# Patient Record
Sex: Male | Born: 1960 | Race: Black or African American | Hispanic: No | Marital: Married | Smoking: Current every day smoker
Health system: Southern US, Community
[De-identification: ages and names within clinical notes are randomized; demographics above are authoritative.]

## PROBLEM LIST (undated history)

## (undated) DIAGNOSIS — I10 Essential (primary) hypertension: Secondary | ICD-10-CM

## (undated) DIAGNOSIS — E119 Type 2 diabetes mellitus without complications: Secondary | ICD-10-CM

## (undated) DIAGNOSIS — E785 Hyperlipidemia, unspecified: Secondary | ICD-10-CM

## (undated) DIAGNOSIS — G709 Myoneural disorder, unspecified: Secondary | ICD-10-CM

## (undated) DIAGNOSIS — K5792 Diverticulitis of intestine, part unspecified, without perforation or abscess without bleeding: Secondary | ICD-10-CM

## (undated) DIAGNOSIS — E079 Disorder of thyroid, unspecified: Secondary | ICD-10-CM

## (undated) DIAGNOSIS — G118 Other hereditary ataxias: Secondary | ICD-10-CM

## (undated) DIAGNOSIS — E291 Testicular hypofunction: Secondary | ICD-10-CM

## (undated) HISTORY — DX: Disorder of thyroid, unspecified: E07.9

## (undated) HISTORY — DX: Essential (primary) hypertension: I10

## (undated) HISTORY — DX: Myoneural disorder, unspecified: G70.9

## (undated) HISTORY — DX: Testicular hypofunction: E29.1

## (undated) HISTORY — DX: Type 2 diabetes mellitus without complications: E11.9

## (undated) HISTORY — DX: Hyperlipidemia, unspecified: E78.5

## (undated) HISTORY — DX: Other hereditary ataxias: G11.8

---

## 2001-06-27 ENCOUNTER — Emergency Department (HOSPITAL_COMMUNITY): Admission: EM | Admit: 2001-06-27 | Discharge: 2001-06-28 | Payer: Self-pay

## 2007-06-25 ENCOUNTER — Encounter: Admission: RE | Admit: 2007-06-25 | Discharge: 2007-06-25 | Payer: Self-pay | Admitting: Internal Medicine

## 2008-01-16 ENCOUNTER — Encounter: Admission: RE | Admit: 2008-01-16 | Discharge: 2008-01-16 | Payer: Self-pay | Admitting: Endocrinology

## 2008-12-25 ENCOUNTER — Ambulatory Visit (HOSPITAL_COMMUNITY): Admission: RE | Admit: 2008-12-25 | Discharge: 2008-12-25 | Payer: Self-pay | Admitting: Family Medicine

## 2009-01-14 ENCOUNTER — Ambulatory Visit (HOSPITAL_COMMUNITY): Admission: RE | Admit: 2009-01-14 | Discharge: 2009-01-14 | Payer: Self-pay | Admitting: Family Medicine

## 2009-01-14 ENCOUNTER — Encounter (INDEPENDENT_AMBULATORY_CARE_PROVIDER_SITE_OTHER): Payer: Self-pay | Admitting: Interventional Radiology

## 2009-02-06 DIAGNOSIS — E079 Disorder of thyroid, unspecified: Secondary | ICD-10-CM

## 2009-02-06 HISTORY — PX: BIOPSY THYROID: PRO38

## 2009-02-06 HISTORY — DX: Disorder of thyroid, unspecified: E07.9

## 2009-02-17 ENCOUNTER — Encounter (HOSPITAL_COMMUNITY): Admission: RE | Admit: 2009-02-17 | Discharge: 2009-05-12 | Payer: Self-pay | Admitting: Internal Medicine

## 2010-10-30 ENCOUNTER — Encounter: Payer: Self-pay | Admitting: Internal Medicine

## 2011-11-06 ENCOUNTER — Ambulatory Visit: Payer: BC Managed Care – PPO

## 2011-12-18 ENCOUNTER — Other Ambulatory Visit: Payer: Self-pay | Admitting: Family Medicine

## 2011-12-18 MED ORDER — LISINOPRIL-HYDROCHLOROTHIAZIDE 20-25 MG PO TABS: 1.0000 | ORAL_TABLET | Freq: Every day | ORAL | Status: DC

## 2011-12-23 ENCOUNTER — Ambulatory Visit (INDEPENDENT_AMBULATORY_CARE_PROVIDER_SITE_OTHER): Payer: Federal, State, Local not specified - PPO | Admitting: Family Medicine

## 2011-12-23 VITALS — BP 157/67 | HR 72 | Temp 98.4°F | Resp 16 | Ht 67.0 in | Wt 226.0 lb

## 2011-12-23 DIAGNOSIS — I10 Essential (primary) hypertension: Secondary | ICD-10-CM

## 2011-12-23 DIAGNOSIS — E1139 Type 2 diabetes mellitus with other diabetic ophthalmic complication: Secondary | ICD-10-CM | POA: Insufficient documentation

## 2011-12-23 DIAGNOSIS — E039 Hypothyroidism, unspecified: Secondary | ICD-10-CM | POA: Insufficient documentation

## 2011-12-23 DIAGNOSIS — E785 Hyperlipidemia, unspecified: Secondary | ICD-10-CM

## 2011-12-23 DIAGNOSIS — E119 Type 2 diabetes mellitus without complications: Secondary | ICD-10-CM

## 2011-12-23 DIAGNOSIS — Z720 Tobacco use: Secondary | ICD-10-CM

## 2011-12-23 LAB — LIPID PANEL
Cholesterol: 113 mg/dL (ref 0–200)
HDL: 34 mg/dL — ABNORMAL LOW (ref >39)
Total CHOL/HDL Ratio: 3.3 Ratio

## 2011-12-23 LAB — TSH: TSH: 2.628 u[IU]/mL (ref 0.350–4.500)

## 2011-12-23 MED ORDER — ATORVASTATIN CALCIUM 40 MG PO TABS: 40.0000 mg | ORAL_TABLET | Freq: Every day | ORAL | Status: DC

## 2011-12-23 MED ORDER — METOPROLOL SUCCINATE ER 100 MG PO TB24: 100.0000 mg | ORAL_TABLET | Freq: Every day | ORAL | Status: DC

## 2011-12-23 MED ORDER — ASPIRIN 81 MG PO TABS: 81.0000 mg | ORAL_TABLET | Freq: Every day | ORAL | Status: DC

## 2011-12-23 MED ORDER — SITAGLIPTIN PHOSPHATE 100 MG PO TABS: 100.0000 mg | ORAL_TABLET | Freq: Every day | ORAL | Status: DC

## 2011-12-23 MED ORDER — LEVOTHYROXINE SODIUM 112 MCG PO TABS: 112.0000 ug | ORAL_TABLET | Freq: Every day | ORAL | Status: DC

## 2011-12-23 MED ORDER — LISINOPRIL-HYDROCHLOROTHIAZIDE 20-25 MG PO TABS: 1.0000 | ORAL_TABLET | Freq: Every day | ORAL | Status: DC

## 2011-12-23 MED ORDER — GLIMEPIRIDE 4 MG PO TABS: 4.0000 mg | ORAL_TABLET | Freq: Every day | ORAL | Status: DC

## 2011-12-23 NOTE — Progress Notes (Signed)
  Subjective:    Patient ID: Randy Ballard, male    DOB: 1961-04-07, 51 y.o.   MRN: [id number]  HPI  Patient presents for routine follow up of his diabetes.  He was diagnosed in 1996.  Recently prescribed Januvia however was too expensive. Subsequently wife put patient on her insurance plan and now patient taking medication daily for 2-3 weeks.  No problems with vision or feet. Last eye exam 3 years ago.  Exercising on treadmill for 30 minutes nightly Reducing carbs and simple sugars   Stressors at work Smoker; 1 pack/day; has tried [employer] cigarettes. Review of Systems     Objective:   Physical Exam  Constitutional: He appears well-developed and well-nourished.  Neck: Carotid bruit is not present. No thyromegaly present.  Cardiovascular: Normal rate, regular rhythm and normal heart sounds.   No murmur heard. Pulmonary/Chest: Effort normal and breath sounds normal.  Abdominal: Soft. Bowel sounds are normal. There is no hepatosplenomegaly.       No abdominal bruits  Neurological: He is alert.  Skin: Skin is warm. Pallor: no neuropathic changes.  Psychiatric: He has a normal mood and affect.     Results for orders placed in visit on 12/23/11  POCT GLYCOSYLATED HEMOGLOBIN (HGB A1C)      Component Value Range   Hemoglobin A1C 6.1         Assessment & Plan:   1. HTN (hypertension)    2. DM type 2 (diabetes mellitus, type 2)  POCT glycosylated hemoglobin (Hb A1C), Microalbumin, urine  3. Hypothyroid  TSH  4. Dyslipidemia  Lipid panel  5. Tobacco abuse     Refilled all medications Anticipatory guidance regarding tobacco cessation Follow up in 3 months

## 2011-12-26 ENCOUNTER — Telehealth: Payer: Self-pay

## 2011-12-26 MED ORDER — METFORMIN HCL 1000 MG PO TABS: 1000.0000 mg | ORAL_TABLET | Freq: Two times a day (BID) | ORAL | Status: DC

## 2011-12-26 NOTE — Telephone Encounter (Signed)
Looks like it was intended to be refilled with the others, but got missed.  I've sent in the metformin.  Please advise the patient.

## 2011-12-26 NOTE — Telephone Encounter (Signed)
PT STATES HE WAS SEEN AND GIVEN NEW RXS BUT THEY DID NOT REFILL METFORMIN,IS HE STILL TO TAKE THAT AND IF SO WHY WAS THAT RX NOT INCLUDED WITH OTHERS??    BEST PHONE  [phone]

## 2011-12-27 NOTE — Telephone Encounter (Signed)
Notified pt that Metformin was sent into his pharmacy. Pt thanked [place].

## 2012-02-05 ENCOUNTER — Ambulatory Visit (INDEPENDENT_AMBULATORY_CARE_PROVIDER_SITE_OTHER): Payer: Federal, State, Local not specified - PPO | Admitting: Internal Medicine

## 2012-02-05 VITALS — BP 170/74 | HR 73 | Temp 98.4°F | Resp 18 | Ht 67.0 in | Wt 223.0 lb

## 2012-02-05 DIAGNOSIS — Z0000 Encounter for general adult medical examination without abnormal findings: Secondary | ICD-10-CM

## 2012-02-05 DIAGNOSIS — Z6834 Body mass index (BMI) 34.0-34.9, adult: Secondary | ICD-10-CM | POA: Insufficient documentation

## 2012-02-05 NOTE — Progress Notes (Signed)
  Subjective:    Patient ID: Randy Ballard, male    DOB: [birth year], 51 y.o.   MRN: [id number]  HPIHere for annual physical Patient Active Problem List  Diagnoses  . HTN (hypertension)  . DM type 2 (diabetes mellitus, type 2)  . Hypothyroid  . Dyslipidemia  . BMI 34.0-34.9,adult  Was here one month ago for labs and medication refills per Dr. 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        Has lost 6 pounds in the last 3 months and is trying hard to lose more  HENT: Positive for dental problem. Negative for hearing loss, ear pain, congestion, mouth sores and trouble swallowing.   Eyes: Negative for photophobia and visual disturbance.  Respiratory: Negative for apnea, cough, choking and chest tightness.   Cardiovascular: Negative for chest pain, palpitations and leg swelling.  Gastrointestinal: Negative for abdominal pain, diarrhea, constipation, blood in stool and rectal pain.  Genitourinary: Negative for urgency and difficulty urinating.  Musculoskeletal: Negative for back pain and joint swelling.  Skin: Negative.   Neurological: Negative.   Hematological: Negative.   Psychiatric/Behavioral: Negative.        Objective:   Physical Exam  Constitutional: He is oriented to person, place, and time. He appears well-developed.       BMI 34  HENT:  Head: Normocephalic.  Right Ear: External ear normal.  Left Ear: External ear normal.  Nose: Nose normal.       He has poor dentition with one molar destroyed to the gumline on the right lower area/several teeth are decayed and several are missing  Eyes: Conjunctivae and EOM are normal. Pupils are equal, round, and reactive to light.  Neck: Normal range of motion. Neck supple. No thyromegaly present.  Cardiovascular: Normal rate, regular rhythm, normal heart sounds and intact distal  pulses.   No murmur heard.      No carotid bruit  Pulmonary/Chest: Effort normal and breath sounds normal.  Abdominal: Soft. Bowel sounds are normal. He exhibits no mass.       No abdominal bruit  Genitourinary: Prostate normal.  Musculoskeletal: Normal range of motion. He exhibits no edema.  Lymphadenopathy:    He has no cervical adenopathy.  Neurological: He is alert and oriented to person, place, and time. He has normal reflexes. No cranial nerve deficit.  Psychiatric: He has a normal mood and affect. Judgment normal.      Results for orders placed in visit on 02/05/12  IFOBT (OCCULT BLOOD)      Component Value Range   IFOBT Negative         Assessment & Plan:  Annual physical examination Patient Active Problem List  Diagnoses  . HTN (hypertension)  . DM type 2 (diabetes mellitus, type 2)  . Hypothyroid  . Dyslipidemia  . BMI 34.0-34.9,adult  --  Nicotine addiction No labs needed today 2 follow up in June for labs and medication refills Needs PSA at next lab work He plans to schedule colonoscopy this year Weight loss advised Smoking cessation advised

## 2012-02-27 ENCOUNTER — Other Ambulatory Visit: Payer: Self-pay | Admitting: Family Medicine

## 2012-03-30 ENCOUNTER — Ambulatory Visit (INDEPENDENT_AMBULATORY_CARE_PROVIDER_SITE_OTHER): Payer: Federal, State, Local not specified - PPO | Admitting: Emergency Medicine

## 2012-03-30 VITALS — BP 139/78 | HR 72 | Temp 98.4°F | Resp 16 | Ht 67.0 in | Wt 228.2 lb

## 2012-03-30 DIAGNOSIS — I10 Essential (primary) hypertension: Secondary | ICD-10-CM

## 2012-03-30 DIAGNOSIS — E039 Hypothyroidism, unspecified: Secondary | ICD-10-CM

## 2012-03-30 DIAGNOSIS — E785 Hyperlipidemia, unspecified: Secondary | ICD-10-CM

## 2012-03-30 DIAGNOSIS — E119 Type 2 diabetes mellitus without complications: Secondary | ICD-10-CM

## 2012-03-30 LAB — LIPID PANEL
HDL: 31 mg/dL — ABNORMAL LOW (ref >39)
LDL Cholesterol: 65 mg/dL (ref 0–99)
Triglycerides: 71 mg/dL (ref <150)
VLDL: 14 mg/dL (ref 0–40)

## 2012-03-30 LAB — COMPREHENSIVE METABOLIC PANEL
ALT: 60 U/L — ABNORMAL HIGH (ref 0–53)
AST: 41 U/L — ABNORMAL HIGH (ref 0–37)
Creat: 0.89 mg/dL (ref 0.50–1.35)
Total Bilirubin: 0.4 mg/dL (ref 0.3–1.2)

## 2012-03-30 LAB — TESTOSTERONE: Testosterone: 200.83 ng/dL — ABNORMAL LOW (ref 300–890)

## 2012-03-30 MED ORDER — SITAGLIPTIN PHOSPHATE 100 MG PO TABS: 100.0000 mg | ORAL_TABLET | Freq: Every day | ORAL | Status: DC

## 2012-03-30 MED ORDER — METFORMIN HCL 1000 MG PO TABS: 1000.0000 mg | ORAL_TABLET | Freq: Two times a day (BID) | ORAL | Status: DC

## 2012-03-30 MED ORDER — ATORVASTATIN CALCIUM 40 MG PO TABS: 40.0000 mg | ORAL_TABLET | Freq: Every day | ORAL | Status: DC

## 2012-03-30 MED ORDER — LISINOPRIL-HYDROCHLOROTHIAZIDE 20-25 MG PO TABS: 1.0000 | ORAL_TABLET | Freq: Every day | ORAL | Status: DC

## 2012-03-30 MED ORDER — LEVOTHYROXINE SODIUM 112 MCG PO TABS: 112.0000 ug | ORAL_TABLET | Freq: Every day | ORAL | Status: DC

## 2012-03-30 MED ORDER — GLIMEPIRIDE 4 MG PO TABS: 4.0000 mg | ORAL_TABLET | Freq: Every day | ORAL | Status: DC

## 2012-03-30 MED ORDER — METOPROLOL SUCCINATE ER 100 MG PO TB24: 100.0000 mg | ORAL_TABLET | Freq: Every day | ORAL | Status: DC

## 2012-03-30 NOTE — Progress Notes (Signed)
  Subjective:    Patient ID: Randy Ballard, male    DOB: 08/01/1961, 51 y.o.   MRN: [id number]  HPI  DOT medical history and form completion.   Interval history of Diabetes stable   Review of Systems  Constitutional: Negative.   HENT: Negative.   Eyes: Negative.   Respiratory: Negative.   Cardiovascular: Negative.   Gastrointestinal: Negative.   Musculoskeletal: Negative.   Neurological: Negative.        Objective:   Physical Exam  Constitutional: He is oriented to person, place, and time. He appears well-developed and well-nourished.  HENT:  Head: Atraumatic.  Right Ear: External ear normal.  Left Ear: External ear normal.  Eyes: Conjunctivae are normal. Pupils are equal, round, and reactive to light.  Neck: Normal range of motion. Neck supple.  Cardiovascular: Normal rate and regular rhythm.   Pulmonary/Chest: Effort normal and breath sounds normal.  Abdominal: Soft.  Musculoskeletal: Normal range of motion.  Neurological: He is alert and oriented to person, place, and time.  Skin: Skin is warm and dry.          Assessment & Plan:  Labs drawn, form completed

## 2012-03-30 NOTE — Addendum Note (Signed)
Addended by: Carmelina Dane on: 03/30/2012 10:17 AM   Modules accepted: Orders, Level of Service

## 2012-03-31 ENCOUNTER — Encounter: Payer: Self-pay | Admitting: Emergency Medicine

## 2012-03-31 ENCOUNTER — Other Ambulatory Visit: Payer: Self-pay | Admitting: Emergency Medicine

## 2012-03-31 MED ORDER — TESTOSTERONE 20.25 MG/1.25GM (1.62%) TD GEL: 2.0000 | TRANSDERMAL | Status: DC

## 2012-04-05 ENCOUNTER — Other Ambulatory Visit: Payer: Self-pay | Admitting: Emergency Medicine

## 2012-04-05 ENCOUNTER — Encounter: Payer: Self-pay | Admitting: Emergency Medicine

## 2012-04-05 DIAGNOSIS — E119 Type 2 diabetes mellitus without complications: Secondary | ICD-10-CM

## 2012-04-05 MED ORDER — VITAMIN D (ERGOCALCIFEROL) 1.25 MG (50000 UNIT) PO CAPS: 50000.0000 [IU] | ORAL_CAPSULE | Freq: Once | ORAL | Status: DC

## 2012-04-05 MED ORDER — ERGOCALCIFEROL 1.25 MG (50000 UT) PO CAPS: 50000.0000 [IU] | ORAL_CAPSULE | ORAL | Status: DC

## 2012-04-11 ENCOUNTER — Encounter: Payer: Self-pay | Admitting: Emergency Medicine

## 2012-08-05 ENCOUNTER — Ambulatory Visit (INDEPENDENT_AMBULATORY_CARE_PROVIDER_SITE_OTHER): Payer: Federal, State, Local not specified - PPO | Admitting: Emergency Medicine

## 2012-08-05 VITALS — BP 168/72 | HR 79 | Temp 98.6°F | Resp 18 | Wt 234.0 lb

## 2012-08-05 DIAGNOSIS — L602 Onychogryphosis: Secondary | ICD-10-CM

## 2012-08-05 DIAGNOSIS — L608 Other nail disorders: Secondary | ICD-10-CM

## 2012-08-05 DIAGNOSIS — E119 Type 2 diabetes mellitus without complications: Secondary | ICD-10-CM

## 2012-08-05 NOTE — Progress Notes (Signed)
[facility] [address] [place] [address] [phone]  Date:  08/05/2012   Name:  Randy Ballard   DOB:  [birth year]   MRN:  [id number]  PCP:  No primary provider on file.    Chief Complaint: Toe Pain   History of Present Illness:  Randy Ballard is a 51 y.o. very pleasant male patient who presents with the following:  Thick and brittle nails.  Has broken his left great toe nail.  Hanging by a thread he says.  Denies bleeding or infection  Patient Active Problem List  Diagnosis  . HTN (hypertension)  . DM type 2 (diabetes mellitus, type 2)  . Hypothyroid  . Dyslipidemia  . BMI 34.0-34.9,adult    Past Medical History  Diagnosis Date  . Diabetes mellitus without complication     No past surgical history on file.  History  Substance Use Topics  . Smoking status: Current Every Day Smoker -- 1.0 packs/day for 30 years  . Smokeless tobacco: Not on file  . Alcohol Use: No    No family history on file.  No Known Allergies  Medication list has been reviewed and updated.  Current Outpatient Prescriptions on File Prior to Visit  Medication Sig Dispense Refill  . aspirin 81 MG tablet Take 1 tablet (81 mg total) by mouth daily.  30 tablet  11  . atorvastatin (LIPITOR) 40 MG tablet Take 1 tablet (40 mg total) by mouth daily.  30 tablet  5  . glimepiride (AMARYL) 4 MG tablet Take 1 tablet (4 mg total) by mouth daily before breakfast.  30 tablet  5  . levothyroxine (SYNTHROID, LEVOTHROID) 112 MCG tablet Take 1 tablet (112 mcg total) by mouth daily.  90 tablet  2  . lisinopril-hydrochlorothiazide (PRINZIDE,ZESTORETIC) 20-25 MG per tablet Take 1 tablet by mouth daily.  30 tablet  5  . metFORMIN (GLUCOPHAGE) 1000 MG tablet Take 1 tablet (1,000 mg total) by mouth 2 (two) times daily with a meal.  60 tablet  5  . metoprolol succinate (TOPROL-XL) 100 MG 24 hr tablet Take 1 tablet (100 mg total) by mouth daily. Take with or immediately following a meal.   30 tablet  5  . Multiple Vitamins-Minerals (MULTIVITAMIN WITH MINERALS) tablet Take 1 tablet by mouth daily.      . sitaGLIPtin (JANUVIA) 100 MG tablet Take 1 tablet (100 mg total) by mouth daily.  30 tablet  5  . ergocalciferol (VITAMIN D2) 50000 UNITS capsule Take 1 capsule (50,000 Units total) by mouth once a week.  4 capsule  12  . Testosterone (ANDROGEL) 20.25 MG/1.25GM (1.62%) GEL Place 2 Squirts onto the skin 1 day or 1 dose.  2.5 g  5    Review of Systems:  As per HPI, otherwise negative.    Physical Examination: Filed Vitals:   08/05/12 1603  BP: 168/72  Pulse: 79  Temp: 98.6 F (37 C)  Resp: 18   Filed Vitals:   08/05/12 1603  Weight: 234 lb (106.142 kg)   There is no height on file to calculate BMI. Ideal Body Weight:     GEN: WDWN, NAD, Non-toxic, Alert & Oriented x 3 HEENT: Atraumatic, Normocephalic.  Ears and Nose: No external deformity. EXTR: No clubbing/cyanosis/edema NEURO: Normal gait.  PSYCH: Normally interactive. Conversant. Not depressed or anxious appearing.  Calm demeanor.  Feet:  onychogyphosis   Assessment and Plan: Nail trimmed to remove avulsed portion of nail plate  Carmelina Dane,  MD   

## 2012-08-12 NOTE — Progress Notes (Signed)
Reviewed and agree.

## 2012-10-27 ENCOUNTER — Ambulatory Visit (INDEPENDENT_AMBULATORY_CARE_PROVIDER_SITE_OTHER): Payer: Federal, State, Local not specified - PPO | Admitting: Family Medicine

## 2012-10-27 VITALS — BP 181/75 | HR 86 | Temp 98.6°F | Resp 16 | Ht 68.0 in | Wt 230.0 lb

## 2012-10-27 DIAGNOSIS — I10 Essential (primary) hypertension: Secondary | ICD-10-CM

## 2012-10-27 DIAGNOSIS — E119 Type 2 diabetes mellitus without complications: Secondary | ICD-10-CM

## 2012-10-27 DIAGNOSIS — E7800 Pure hypercholesterolemia, unspecified: Secondary | ICD-10-CM

## 2012-10-27 DIAGNOSIS — E785 Hyperlipidemia, unspecified: Secondary | ICD-10-CM

## 2012-10-27 DIAGNOSIS — Z23 Encounter for immunization: Secondary | ICD-10-CM

## 2012-10-27 DIAGNOSIS — D649 Anemia, unspecified: Secondary | ICD-10-CM

## 2012-10-27 DIAGNOSIS — E039 Hypothyroidism, unspecified: Secondary | ICD-10-CM

## 2012-10-27 LAB — LIPID PANEL
HDL: 30 mg/dL — ABNORMAL LOW (ref >39)
LDL Cholesterol: 111 mg/dL — ABNORMAL HIGH (ref 0–99)
Triglycerides: 252 mg/dL — ABNORMAL HIGH (ref <150)
VLDL: 50 mg/dL — ABNORMAL HIGH (ref 0–40)

## 2012-10-27 LAB — POCT URINALYSIS DIPSTICK
Blood, UA: NEGATIVE
Nitrite, UA: NEGATIVE
Protein, UA: 30
Urobilinogen, UA: 0.2
pH, UA: 5

## 2012-10-27 LAB — POCT GLYCOSYLATED HEMOGLOBIN (HGB A1C): Hemoglobin A1C: 8.7

## 2012-10-27 LAB — POCT CBC
Hemoglobin: 10.2 g/dL — AB (ref 14.1–18.1)
Lymph, poc: 1.8 (ref 0.6–3.4)
MCHC: 31.5 g/dL — AB (ref 31.8–35.4)
MPV: 9.2 fL (ref 0–99.8)
POC Granulocyte: 5.1 (ref 2–6.9)
POC LYMPH PERCENT: 24.4 %L (ref 10–50)
POC MID %: 6.1 %M (ref 0–12)
RDW, POC: 12.8 %

## 2012-10-27 LAB — COMPREHENSIVE METABOLIC PANEL
AST: 29 U/L (ref 0–37)
Alkaline Phosphatase: 74 U/L (ref 39–117)
BUN: 11 mg/dL (ref 6–23)
Calcium: 9.5 mg/dL (ref 8.4–10.5)
Creat: 0.93 mg/dL (ref 0.50–1.35)

## 2012-10-27 LAB — IBC PANEL
%SAT: 32 % (ref 20–55)
TIBC: 319 ug/dL (ref 215–435)

## 2012-10-27 LAB — IFOBT (OCCULT BLOOD): IFOBT: NEGATIVE

## 2012-10-27 MED ORDER — METOPROLOL SUCCINATE ER 100 MG PO TB24: 100.0000 mg | ORAL_TABLET | Freq: Every day | ORAL | Status: DC

## 2012-10-27 MED ORDER — SITAGLIPTIN PHOSPHATE 100 MG PO TABS: 100.0000 mg | ORAL_TABLET | Freq: Every day | ORAL | Status: DC

## 2012-10-27 MED ORDER — GLIMEPIRIDE 4 MG PO TABS: 4.0000 mg | ORAL_TABLET | Freq: Every day | ORAL | Status: DC

## 2012-10-27 MED ORDER — LISINOPRIL-HYDROCHLOROTHIAZIDE 20-25 MG PO TABS: 1.0000 | ORAL_TABLET | Freq: Every day | ORAL | Status: DC

## 2012-10-27 MED ORDER — ATORVASTATIN CALCIUM 40 MG PO TABS: 40.0000 mg | ORAL_TABLET | Freq: Every day | ORAL | Status: DC

## 2012-10-27 MED ORDER — LEVOTHYROXINE SODIUM 112 MCG PO TABS: 112.0000 ug | ORAL_TABLET | Freq: Every day | ORAL | Status: DC

## 2012-10-27 MED ORDER — METFORMIN HCL 1000 MG PO TABS: 1000.0000 mg | ORAL_TABLET | Freq: Two times a day (BID) | ORAL | Status: DC

## 2012-10-27 NOTE — Progress Notes (Signed)
[address]   [place], [place]  [address]   [phone]  Subjective:    Patient ID: Randy Ballard, male    DOB: Jul 15, 1961, 52 y.o.   MRN: [id number]  HPIThis 52 y.o. male presents for evaluation of the following:  1.  DMII:  Seven month follow-up.  Checking sugars weekly; 150 fasting; highest reading 212 random.  Examines feet closely.  Ran out of medication other than Metformin.  Using OTC liquid fungal toenail medication twice daily.  S/p flu vaccine.  No Pneumovax in past.   Last eye exam 3 years.  No changes to management at last visit; last HgbA1c 6.2.  2.  Hyperlipidemia: seven month follow-up.  No changes to management at last visit.  Ran out of medication in past several weeks.  Denies HA/dizziness/focal weakness/paresthesias.   3.  HTN:  Ran out of medication; 130/72 usually when taking all medication.  No changes to management made at last visit.  Denies CP/palp/SOB but some leg swelling LLE ankle.    4.  Hypothyroidism:  Ten month follow-up; no changes to management made at last visit; feels well; does have weakness of hands at times; mother with some neuromuscular disease that is genetic; has paperwork from mother's physician but forgot to bring it today; will bring to next visit.  5.  Anemia: no previous history of anemia; denies n/v/d/c.  Denies bloody stools or melena.  Denies abdominal pain.  No unintentional weight loss.  No previous colonoscopy but planned to complete in upcoming year.  Review of Systems  Constitutional: Negative for fever, chills, diaphoresis and fatigue.  Respiratory: Negative for apnea, shortness of breath and wheezing.   Cardiovascular: Positive for leg swelling. Negative for chest pain and palpitations.  Gastrointestinal: Negative for nausea, vomiting, abdominal pain and diarrhea.  Skin: Negative for color change, pallor, rash and wound.  Neurological: Negative for dizziness, tremors, syncope, facial asymmetry, speech difficulty, weakness,  light-headedness, numbness and headaches.        Past Medical History  Diagnosis Date  . Diabetes mellitus without complication   . Hyperlipidemia   . Hypertension   . Thyroid disease   . Hypogonadism male     History reviewed. No pertinent past surgical history.  Prior to Admission medications   Medication Sig Start Date End Date Taking? Authorizing Provider  aspirin 81 MG tablet Take 1 tablet (81 mg total) by mouth daily. 12/23/11  Yes Dois Davenport, MD  atorvastatin (LIPITOR) 40 MG tablet Take 1 tablet (40 mg total) by mouth daily. 03/30/12  Yes Phillips Odor, MD  levothyroxine (SYNTHROID, LEVOTHROID) 112 MCG tablet Take 1 tablet (112 mcg total) by mouth daily. 03/30/12  Yes Phillips Odor, MD  lisinopril-hydrochlorothiazide (PRINZIDE,ZESTORETIC) 20-25 MG per tablet Take 1 tablet by mouth daily. 03/30/12  Yes Phillips Odor, MD  metFORMIN (GLUCOPHAGE) 1000 MG tablet Take 1 tablet (1,000 mg total) by mouth 2 (two) times daily with a meal. 03/30/12  Yes Phillips Odor, MD  metoprolol succinate (TOPROL-XL) 100 MG 24 hr tablet Take 1 tablet (100 mg total) by mouth daily. Take with or immediately following a meal. 03/30/12  Yes Phillips Odor, MD  Multiple Vitamins-Minerals (MULTIVITAMIN WITH MINERALS) tablet Take 1 tablet by mouth daily.   Yes Historical Provider, MD  sitaGLIPtin (JANUVIA) 100 MG tablet Take 1 tablet (100 mg total) by mouth daily. 03/30/12  Yes Phillips Odor, MD  glimepiride (AMARYL) 4 MG tablet Take 1 tablet (4 mg total) by mouth daily before breakfast. 03/30/12  Phillips Odor, MD    No Known Allergies  History   Social History  . Marital Status: Married    Spouse Name: N/A    Number of Children: N/A  . Years of Education: N/A   Occupational History  . service rep    Social History Main Topics  . Smoking status: Current Some Day Smoker -- 1.0 packs/day for 30 years  . Smokeless tobacco: Not on file  . Alcohol Use: No  . Drug Use: No  .  Sexually Active: Yes     Comment: 1 partner in last 12 months   Other Topics Concern  . Not on file   Social History Narrative   Marital status:  Married x 20 years; happily.   Children: none   Lives: with wife.   Employment: delivers oxygen; works for [employer]; drives locally   Tobacco: trying to quit 10/2012.   Alcohol:  Once per month   Drugs: none   Exercise: treadmill sporadic.    History reviewed. No pertinent family history.  Objective:   Physical Exam  Nursing note and vitals reviewed. Constitutional: He is oriented to person, place, and time. He appears well-developed and well-nourished. No distress.  HENT:  Mouth/Throat: Oropharynx is clear and moist.  Eyes: Conjunctivae normal and EOM are normal. Pupils are equal, round, and reactive to light.  Neck: Normal range of motion. Neck supple. No JVD present. No thyromegaly present.  Cardiovascular: Normal rate, regular rhythm, normal heart sounds and intact distal pulses.  Exam reveals no gallop and no friction rub.   No murmur heard. Pulmonary/Chest: Effort normal and breath sounds normal. He has no wheezes. He has no rales.  Abdominal: Soft. Bowel sounds are normal. He exhibits no distension and no mass. There is no tenderness. There is no rebound and no guarding.  Genitourinary: Rectal exam shows no external hemorrhoid, no fissure, no mass and no tenderness.  Lymphadenopathy:    He has no cervical adenopathy.  Neurological: He is alert and oriented to person, place, and time. No cranial nerve deficit. He exhibits normal muscle tone. Coordination normal.       Monofilament intact B feet.    Skin: Skin is warm and dry. No rash noted. He is not diaphoretic.       Dystrophic nails throughout B feet.  Psychiatric: He has a normal mood and affect. His behavior is normal.    PNEUMOVAX ADMINISTERED.  Results for orders placed in visit on 10/27/12  POCT CBC      Component Value Range   WBC 7.3  4.6 - 10.2 K/uL   Lymph, poc 1.8   0.6 - 3.4   POC LYMPH PERCENT 24.4  10 - 50 %L   MID (cbc) 0.4  0 - 0.9   POC MID % 6.1  0 - 12 %M   POC Granulocyte 5.1  2 - 6.9   Granulocyte percent 69.5  37 - 80 %G   RBC 3.40 (*) 4.69 - 6.13 M/uL   Hemoglobin 10.2 (*) 14.1 - 18.1 g/dL   HCT, POC 21.3 (*) 08.6 - 53.7 %   MCV 95.2  80 - 97 fL   MCH, POC 30.0  27 - 31.2 pg   MCHC 31.5 (*) 31.8 - 35.4 g/dL   RDW, POC 57.8     Platelet Count, POC 276  142 - 424 K/uL   MPV 9.2  0 - 99.8 fL  POCT GLYCOSYLATED HEMOGLOBIN (HGB A1C)      Component Value  Range   Hemoglobin A1C 8.7    POCT URINALYSIS DIPSTICK      Component Value Range   Color, UA yellow     Clarity, UA clear     Glucose, UA 500     Bilirubin, UA neg     Ketones, UA neg     Spec Grav, UA 1.025     Blood, UA neg     pH, UA 5.0     Protein, UA 30     Urobilinogen, UA 0.2     Nitrite, UA neg     Leukocytes, UA Negative         Assessment & Plan:   1. Diabetes  POCT CBC, POCT glycosylated hemoglobin (Hb A1C), POCT urinalysis dipstick, Comprehensive metabolic panel, TSH, Lipid panel, Microalbumin, urine, Pneumococcal polysaccharide vaccine 23-valent greater than or equal to 2yo subcutaneous/IM  2. Essential hypertension, benign    3. Pure hypercholesterolemia    4. Unspecified hypothyroidism    5.  Pneumovax needed 6.  Anemia   1.  DMII:  Controlled; non-compliance with follow-up; obtain labs; refills provided.  S/p Pneumovax. 2.  HTN:  Uncontrolled due to non-compliance with medications.  Obtain labs. 3.  Hyperlipidemia:  Controlled; obtain labs; refills provided. 4.  Hypothyroidism: controlled; obtain labs; refills provided. 5.  S/p Pneumovax. 6.  Anemia:  New onset.  Hemosure obtained; refer for GI consultation. RTC  In 2-3 weeks for repeat CBC to rule out rapid blood loss.    Meds ordered this encounter  Medications  . metoprolol succinate (TOPROL-XL) 100 MG 24 hr tablet    Sig: Take 1 tablet (100 mg total) by mouth daily. Take with or immediately  following a meal.    Dispense:  30 tablet    Refill:  5  . atorvastatin (LIPITOR) 40 MG tablet    Sig: Take 1 tablet (40 mg total) by mouth daily.    Dispense:  30 tablet    Refill:  5  . glimepiride (AMARYL) 4 MG tablet    Sig: Take 1 tablet (4 mg total) by mouth daily before breakfast.    Dispense:  30 tablet    Refill:  5  . metFORMIN (GLUCOPHAGE) 1000 MG tablet    Sig: Take 1 tablet (1,000 mg total) by mouth 2 (two) times daily with a meal.    Dispense:  60 tablet    Refill:  5  . sitaGLIPtin (JANUVIA) 100 MG tablet    Sig: Take 1 tablet (100 mg total) by mouth daily.    Dispense:  30 tablet    Refill:  5  . lisinopril-hydrochlorothiazide (PRINZIDE,ZESTORETIC) 20-25 MG per tablet    Sig: Take 1 tablet by mouth daily.    Dispense:  30 tablet    Refill:  5  . levothyroxine (SYNTHROID, LEVOTHROID) 112 MCG tablet    Sig: Take 1 tablet (112 mcg total) by mouth daily.    Dispense:  90 tablet    Refill:  2

## 2012-10-27 NOTE — Patient Instructions (Addendum)
1. Diabetes  POCT CBC, POCT glycosylated hemoglobin (Hb A1C), POCT urinalysis dipstick, Comprehensive metabolic panel, TSH, Lipid panel, Microalbumin, urine, Pneumococcal polysaccharide vaccine 23-valent greater than or equal to 53yo subcutaneous/IM  2. Essential hypertension, benign    3. Pure hypercholesterolemia    4. Unspecified hypothyroidism  levothyroxine (SYNTHROID, LEVOTHROID) 112 MCG tablet  5. Unspecified essential hypertension  metoprolol succinate (TOPROL-XL) 100 MG 24 hr tablet, lisinopril-hydrochlorothiazide (PRINZIDE,ZESTORETIC) 20-25 MG per tablet  6. Other and unspecified hyperlipidemia  atorvastatin (LIPITOR) 40 MG tablet  7. Type II or unspecified type diabetes mellitus without mention of complication, not stated as uncontrolled  glimepiride (AMARYL) 4 MG tablet, metFORMIN (GLUCOPHAGE) 1000 MG tablet, sitaGLIPtin (JANUVIA) 100 MG tablet, lisinopril-hydrochlorothiazide (PRINZIDE,ZESTORETIC) 20-25 MG per tablet  8. Anemia  IFOBT POC (occult bld, rslt in office), Ferritin, IBC Panel, Iron     1. WE WILL CONTACT YOU WITH APPOINTMENT IN 2-3 WEEKS TO REPEAT HEMOGLOBIN. 2. WE WILL REFER YOU TO GI SPECIALIST FOR COLONOSCOPY DUE TO ANEMIA.

## 2012-10-28 LAB — FERRITIN: Ferritin: 109 ng/mL (ref 22–322)

## 2012-10-29 NOTE — Progress Notes (Signed)
Appt made with Dr. Clelia Croft for 11/15/12. Randy Ballard

## 2012-11-02 ENCOUNTER — Encounter: Payer: Self-pay | Admitting: Radiology

## 2012-11-09 HISTORY — PX: COLONOSCOPY W/ POLYPECTOMY: SHX1380

## 2012-11-15 ENCOUNTER — Ambulatory Visit (INDEPENDENT_AMBULATORY_CARE_PROVIDER_SITE_OTHER): Payer: Federal, State, Local not specified - PPO | Admitting: Family Medicine

## 2012-11-15 ENCOUNTER — Encounter: Payer: Self-pay | Admitting: Family Medicine

## 2012-11-15 VITALS — BP 130/80 | HR 79 | Temp 98.1°F | Resp 16 | Ht 66.5 in | Wt 226.5 lb

## 2012-11-15 DIAGNOSIS — R5381 Other malaise: Secondary | ICD-10-CM

## 2012-11-15 DIAGNOSIS — R270 Ataxia, unspecified: Secondary | ICD-10-CM

## 2012-11-15 DIAGNOSIS — R5383 Other fatigue: Secondary | ICD-10-CM

## 2012-11-15 DIAGNOSIS — E559 Vitamin D deficiency, unspecified: Secondary | ICD-10-CM

## 2012-11-15 DIAGNOSIS — E291 Testicular hypofunction: Secondary | ICD-10-CM

## 2012-11-15 DIAGNOSIS — D649 Anemia, unspecified: Secondary | ICD-10-CM

## 2012-11-15 DIAGNOSIS — R279 Unspecified lack of coordination: Secondary | ICD-10-CM

## 2012-11-15 LAB — POCT CBC
Granulocyte percent: 74.8 %G (ref 37–80)
MID (cbc): 0.5 (ref 0–0.9)
MPV: 9.7 fL (ref 0–99.8)
POC Granulocyte: 8.2 — AB (ref 2–6.9)
POC LYMPH PERCENT: 20.3 %L (ref 10–50)
Platelet Count, POC: 280 10*3/uL (ref 142–424)
RDW, POC: 12.8 %

## 2012-11-15 NOTE — Progress Notes (Addendum)
Subjective:    Patient ID: Randy Ballard, male    DOB: 11/19/1960, 52 y.o.   MRN: [id number] Chief Complaint  Patient presents with  . Follow-up    previou office visit - diabetes    HPI  Randy Ballard is here to f/u his newly diagnosed anemia - he needs a recheck cbc to check on the acuity of his anemia of unknown origin and ensure he is not dropping to rapidly.  He has no idea where his anemia is from. He is sched for an upper endoscopy and colonoscopy on Tuesday 2/11.   Nothing out of normal as far as back pains. He does have stomach pains occ, constipation occ, no melena or hematochezia, no other potential source of blood loss. 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 Last mo pt had been out of [place] and glimeperide and has has stopped exercising. He had been on metformin but restart the rest.  He feels his walking is unstable - he will stumble, walk into the wall - his mom has a genetic disease "Machado-Joseph" disease - SCA 3 - spinocerebellar ataxia type 3.  Occaionally he gets muscle spasms, his fingers are numb and decreased strength in hands  He has restarted his diabetic medications. He is not having any hypoglycemic sxs. Morning cbgs about 120-140, after eating 165-175, up to 213.   Review of Systems  Constitutional: Negative for fever and chills.  Eyes: Negative for visual disturbance.  Respiratory: Negative for shortness of breath.   Cardiovascular: Positive for palpitations. Negative for chest pain and leg swelling.  Gastrointestinal: Positive for abdominal pain and constipation. Negative for vomiting, blood in stool, abdominal distention and anal bleeding.  Genitourinary: Negative for hematuria.  Musculoskeletal: Positive for myalgias, back pain, arthralgias and gait problem. Negative for joint swelling.  Skin: Negative for color change, pallor and rash.   Neurological: Positive for tremors, weakness, light-headedness and numbness. Negative for dizziness, syncope, facial asymmetry and headaches.  Hematological: Does not bruise/bleed easily.      BP 130/80  Pulse 79  Temp 98.1 F (36.7 C) (Oral)  Resp 16  Ht 5' 6.5" (1.689 m)  Wt 226 lb 8 oz (102.74 kg)  BMI 36.01 kg/m2  SpO2 100% Objective:   Physical Exam  Constitutional: He is oriented to person, place, and time. He appears well-developed and well-nourished. No distress.  HENT:  Head: Normocephalic and atraumatic.  Eyes: Conjunctivae are normal. Pupils are equal, round, and reactive to light. No scleral icterus.  Neck: Normal range of motion. Neck supple. No thyromegaly present.  Cardiovascular: Normal rate, regular rhythm, normal heart sounds and intact distal pulses.   Pulmonary/Chest: Effort normal and breath sounds normal. No respiratory distress.  Musculoskeletal: He exhibits no edema.  Lymphadenopathy:    He has no cervical adenopathy.  Neurological: He is alert and oriented to person, place, and time.  Skin: Skin is warm and dry. He is not diaphoretic.  Psychiatric: He has a normal mood and affect. His behavior is normal.          Results for orders placed in visit on 11/15/12  POCT CBC      Component Value Range   WBC 10.9 (*) 4.6 - 10.2 K/uL   Lymph, poc 2.2  0.6 - 3.4   POC LYMPH PERCENT 20.3  10 - 50 %L   MID (cbc) 0.5  0 - 0.9   POC MID % 4.9  0 - 12 %M   POC Granulocyte 8.2 (*) 2 - 6.9   Granulocyte percent 74.8  37 - 80 %G   RBC 4.13 (*) 4.69 - 6.13 M/uL   Hemoglobin 12.5 (*) 14.1 - 18.1 g/dL   HCT, POC 46.9 (*) 62.9 - 53.7 %   MCV 94.8  80 - 97 fL   MCH, POC 30.3  27 - 31.2 pg   MCHC 31.9  31.8 - 35.4 g/dL   RDW, POC 52.8     Platelet Count, POC 280  142 - 424 K/uL   MPV 9.7  0 - 99.8 fL    Assessment & Plan:  Anemia - Plan: POCT CBC, Vitamin B12, Folate, Reticulocytes.  Luckily his hgb is significantly HIGHER/improved from previous and he has  upper and lower endoscopies sched for next wk.  Ataxia - Plan: Ambulatory referral to Physical Therapy. Pt's mother with genetic disease spinocerebellar ataxia type 3 - Machado-Joseph disease - and unfortunately pt has seen many of the sxs in himself (gait d/o and trouble w/ proprioception) so proceed w/ PT referral and rx given for genetic testing at [facility] lab for sca3 alleles.   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  Consider rechecking at f/u - fasting in a.m.  Fatigue - Plan: Reticulocytes  Vitamin D deficiency - recheck at f/u  No orders of the defined types were placed in this encounter.   Recheck in 2 mos for repeat hgba1c

## 2012-11-16 LAB — VITAMIN B12: Vitamin B-12: 817 pg/mL (ref 211–911)

## 2012-11-16 LAB — FOLATE: Folate: 17.7 ng/mL

## 2012-11-23 ENCOUNTER — Other Ambulatory Visit: Payer: Self-pay

## 2012-12-25 ENCOUNTER — Encounter: Payer: Self-pay | Admitting: Family Medicine

## 2013-01-30 ENCOUNTER — Ambulatory Visit (INDEPENDENT_AMBULATORY_CARE_PROVIDER_SITE_OTHER): Payer: Federal, State, Local not specified - PPO | Admitting: Emergency Medicine

## 2013-01-30 ENCOUNTER — Ambulatory Visit: Payer: Federal, State, Local not specified - PPO | Admitting: Emergency Medicine

## 2013-01-30 VITALS — BP 148/80 | HR 65 | Temp 98.1°F | Resp 18 | Ht 67.0 in | Wt 224.0 lb

## 2013-01-30 VITALS — BP 132/72 | HR 65 | Temp 98.1°F | Resp 18 | Ht 67.0 in | Wt 224.0 lb

## 2013-01-30 DIAGNOSIS — Z0289 Encounter for other administrative examinations: Secondary | ICD-10-CM

## 2013-01-30 DIAGNOSIS — E119 Type 2 diabetes mellitus without complications: Secondary | ICD-10-CM

## 2013-01-30 NOTE — Progress Notes (Deleted)
  Subjective:    Patient ID: Randy Ballard, male    DOB: 05-27-1961, 52 y.o.   MRN: [id number]  HPI    Review of Systems     Objective:   Physical Exam        Assessment & Plan:

## 2013-01-30 NOTE — Progress Notes (Signed)
  Subjective:    Patient ID: Randy Ballard, male    DOB: 04/22/1961, 52 y.o.   MRN: [id number]  HPI    Review of Systems     Objective:   Physical Exam        Assessment & Plan:   

## 2013-01-30 NOTE — Progress Notes (Signed)
  Subjective:    Patient ID: Randy Ballard, male    DOB: [birth year], 52 y.o.   MRN: [id number]  HPI patient here for followup of his diabetes high blood high cholesterol and thyroid disease. 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 Clelia Croft  so she can work on his diabetes and get it under better control. His last hemoglobin A1c was 8.9. He is a pack-a-day smoker has smoked for 30 years   Review of Systems     Objective:   Physical Exam HEENT exam is unremarkable. His neck is supple. His chest is clear to auscultation and percussion. Heart regular rate no murmurs. Abdomen was protuberant but no masses were felt. There are no inguinal hernias felt. Extremities are without edema. Because of his smoking history I did a peak flow on him which was 600.  Results for orders placed in visit on 01/30/13  GLUCOSE, POCT (MANUAL RESULT ENTRY)      Result Value Range   POC Glucose 195 (*) 70 - 99 mg/dl        Assessment & Plan:  I made him an appointment to see Dr. Clelia Croft for further evaluation. DOT form was completed. Because his diabetes is uncontrolled I gave him a six-month card. This would give him time to get in to see Dr. Clelia Croft and have further evaluation of his diabetes and had any other testing which is indicated.

## 2013-02-22 ENCOUNTER — Encounter: Payer: Self-pay | Admitting: Emergency Medicine

## 2013-04-07 DIAGNOSIS — E119 Type 2 diabetes mellitus without complications: Secondary | ICD-10-CM

## 2013-04-10 ENCOUNTER — Ambulatory Visit: Payer: Federal, State, Local not specified - PPO

## 2013-04-10 ENCOUNTER — Ambulatory Visit (INDEPENDENT_AMBULATORY_CARE_PROVIDER_SITE_OTHER): Payer: Federal, State, Local not specified - PPO | Admitting: Family Medicine

## 2013-04-10 VITALS — BP 116/78 | HR 68 | Temp 98.3°F | Resp 16 | Ht 67.0 in | Wt 215.0 lb

## 2013-04-10 DIAGNOSIS — D649 Anemia, unspecified: Secondary | ICD-10-CM

## 2013-04-10 DIAGNOSIS — K5900 Constipation, unspecified: Secondary | ICD-10-CM

## 2013-04-10 DIAGNOSIS — K5732 Diverticulitis of large intestine without perforation or abscess without bleeding: Secondary | ICD-10-CM

## 2013-04-10 DIAGNOSIS — R109 Unspecified abdominal pain: Secondary | ICD-10-CM

## 2013-04-10 LAB — IFOBT (OCCULT BLOOD): IFOBT: NEGATIVE

## 2013-04-10 LAB — POCT CBC
Granulocyte percent: 80.2 %G — AB (ref 37–80)
HCT, POC: 47.1 % (ref 43.5–53.7)
Hemoglobin: 15.3 g/dL (ref 14.1–18.1)
Lymph, poc: 2 (ref 0.6–3.4)
MCH, POC: 31.2 pg (ref 27–31.2)
MCHC: 32.5 g/dL (ref 31.8–35.4)
MCV: 95.9 fL (ref 80–97)
MID (cbc): 0.9 (ref 0–0.9)
MPV: 9.8 fL (ref 0–99.8)
POC Granulocyte: 11.9 — AB (ref 2–6.9)
POC LYMPH PERCENT: 13.5 %L (ref 10–50)
POC MID %: 6.3 %M (ref 0–12)
Platelet Count, POC: 309 10*3/uL (ref 142–424)
RBC: 4.91 M/uL (ref 4.69–6.13)
RDW, POC: 12.4 %
WBC: 14.9 10*3/uL — AB (ref 4.6–10.2)

## 2013-04-10 MED ORDER — CIPROFLOXACIN HCL 500 MG PO TABS: 500.0000 mg | ORAL_TABLET | Freq: Two times a day (BID) | ORAL | Status: DC

## 2013-04-10 MED ORDER — METRONIDAZOLE 500 MG PO TABS: 500.0000 mg | ORAL_TABLET | Freq: Three times a day (TID) | ORAL | Status: DC

## 2013-04-10 NOTE — Progress Notes (Signed)
Subjective: Patient is here complaining of constipation for the last 9 days or so. That was when his last good p.m. was. He is felt bloated and mild pain in the abdomen. He has used over-the-counter laxatives including Dulcolax and milk of magnesia. He had some watery stool yesterday that he felt like he gone around it. He has gotten some bubbling and gas in his upper abdomen. He has not had any nausea or vomiting. He does report having a colonoscopy earlier this year which was normal except for a few diverticula Objective: Gen. a healthy-appearing man. Abdomen has occasional bowel sounds. Soft without organomegaly or masses. Has some mild general tenderness, more across the suprapubic area. Digital rectal exam was normal with no impaction. Prostate feels okay. Hemoccult taken.  Assessment: Constipation and abdominal discomfort History of diverticulosis  Plan: X-ray abdomen CBC hemasure  [facility] reading (PRIMARY) by  Dr. Alwyn Ren Nonspecific gas-stool pattern, splenic flexure gas.  Results for orders placed in visit on 04/10/13  POCT CBC      Result Value Range   WBC 14.9 (*) 4.6 - 10.2 K/uL   Lymph, poc 2.0  0.6 - 3.4   POC LYMPH PERCENT 13.5  10 - 50 %L   MID (cbc) 0.9  0 - 0.9   POC MID % 6.3  0 - 12 %M   POC Granulocyte 11.9 (*) 2 - 6.9   Granulocyte percent 80.2 (*) 37 - 80 %G   RBC 4.91  4.69 - 6.13 M/uL   Hemoglobin 15.3  14.1 - 18.1 g/dL   HCT, POC 52.8  41.3 - 53.7 %   MCV 95.9  80 - 97 fL   MCH, POC 31.2  27 - 31.2 pg   MCHC 32.5  31.8 - 35.4 g/dL   RDW, POC 24.4     Platelet Count, POC 309  142 - 424 K/uL   MPV 9.8  0 - 99.8 fL  IFOBT (OCCULT BLOOD)      Result Value Range   IFOBT Negative     Assessment: With the elevated white blood count he may have a low-grade diverticulitis along with the constipation. We'll add antibiotics to his regimen.. Anemia

## 2013-04-10 NOTE — Patient Instructions (Addendum)
Drink a bottle of magnesium citrate (over-the-counter)  Then begin Miralax one dose once or twice daily in order to keep bowels loose. If stools are loose cut back to every 2 or 3 days.  If problems persist return, otherwise return Sunday or Monday and see Dr. Clelia Croft. She can followup on the constipation and diverticulitis and anemia as well as your diabetes.  Take Cipro (ciprofloxacin) it twice daily and Flagyl (metronidazole) 3 times daily for diverticulitis  Go to the emergency room if acutely worse at anytime.

## 2013-04-13 ENCOUNTER — Ambulatory Visit (INDEPENDENT_AMBULATORY_CARE_PROVIDER_SITE_OTHER): Payer: Federal, State, Local not specified - PPO | Admitting: Family Medicine

## 2013-04-13 VITALS — BP 136/80 | HR 46 | Temp 97.2°F | Resp 18 | Ht 68.0 in | Wt 217.0 lb

## 2013-04-13 DIAGNOSIS — D649 Anemia, unspecified: Secondary | ICD-10-CM

## 2013-04-13 DIAGNOSIS — IMO0001 Reserved for inherently not codable concepts without codable children: Secondary | ICD-10-CM

## 2013-04-13 DIAGNOSIS — K5732 Diverticulitis of large intestine without perforation or abscess without bleeding: Secondary | ICD-10-CM

## 2013-04-13 DIAGNOSIS — E039 Hypothyroidism, unspecified: Secondary | ICD-10-CM

## 2013-04-13 DIAGNOSIS — K5900 Constipation, unspecified: Secondary | ICD-10-CM

## 2013-04-13 LAB — POCT GLYCOSYLATED HEMOGLOBIN (HGB A1C): Hemoglobin A1C: 7.6

## 2013-04-13 LAB — POCT CBC
HCT, POC: 45.9 % (ref 43.5–53.7)
Lymph, poc: 2 (ref 0.6–3.4)
MCHC: 32.5 g/dL (ref 31.8–35.4)
POC Granulocyte: 11.7 — AB (ref 2–6.9)
POC LYMPH PERCENT: 13.3 %L (ref 10–50)
RDW, POC: 12.6 %

## 2013-04-13 MED ORDER — LACTULOSE 20 GM/30ML PO SOLN: 30.0000 mL | Freq: Two times a day (BID) | ORAL | Status: DC

## 2013-04-13 NOTE — Patient Instructions (Addendum)
Start Fleets Stimulant Laxative or Fleets Bisacodyl enema tonight and consider repeating tomorrow.  Start the lactulose (2 tablespoons) twice a day and consider starting twice a day Senna as well (senokot).  Return in October to recheck your diabetes and redo your DOT physical - if you schedule next door at our [facility] on a Friday, make sure you make it a double appointment slot - a full 30 minutes - so we can do both.  1800 Calorie Diet for Diabetes Meal Planning The 1800 calorie diet is designed for eating up to 1800 calories each day. Following this diet and making healthy meal choices can help improve overall health. This diet controls blood sugar (glucose) levels and can also help lower blood pressure and cholesterol. SERVING SIZES Measuring foods and serving sizes helps to make sure you are getting the right amount of food. The list below tells how big or small some common serving sizes are:  1 oz.........4 stacked dice.  3 oz........Marland KitchenDeck of cards.  1 tsp.......Marland KitchenTip of little finger.  1 tbs......Marland KitchenMarland KitchenThumb.  2 tbs.......Marland KitchenGolf ball.   cup......Marland KitchenHalf of a fist.  1 cup.......Marland KitchenA fist. GUIDELINES FOR CHOOSING FOODS The goal of this diet is to eat a variety of foods and limit calories to 1800 each day. This can be done by choosing foods that are low in calories and fat. The diet also suggests eating small amounts of food frequently. Doing this helps control your blood glucose levels so they do not get too high or too low. Each meal or snack may include a protein food source to help you feel more satisfied and to stabilize your blood glucose. Try to eat about the same amount of food around the same time each day. This includes weekend days, travel days, and days off work. Space your meals about 4 to 5 hours apart and add a snack between them if you wish.  For example, a daily food plan could include breakfast, a morning snack, lunch, dinner, and an evening snack. Healthy meals and snacks  include whole grains, vegetables, fruits, lean meats, poultry, fish, and dairy products. As you plan your meals, select a variety of foods. Choose from the bread and starch, vegetable, fruit, dairy, and meat/protein groups. Examples of foods from each group and their suggested serving sizes are listed below. Use measuring cups and spoons to become familiar with what a healthy portion looks like. Bread and Starch Each serving equals 15 grams of carbohydrates.  1 slice bread.   bagel.   cup cold cereal (unsweetened).   cup hot cereal or mashed potatoes.  1 small potato (size of a computer mouse).   cup cooked pasta or rice.   English muffin.  1 cup broth-based soup.  3 cups of popcorn.  4 to 6 whole-wheat crackers.   cup cooked beans, peas, or corn. Vegetable Each serving equals 5 grams of carbohydrates.   cup cooked vegetables.  1 cup raw vegetables.   cup tomato or vegetable juice. Fruit Each serving equals 15 grams of carbohydrates.  1 small apple or orange.  1 cup watermelon or strawberries.   cup applesauce (no sugar added).  2 tbs raisins.   banana.   cup canned fruit, packed in water, its own juice, or sweetened with a sugar substitute.   cup unsweetened fruit juice. Dairy Each serving equals 12 to 15 grams of carbohydrates.  1 cup fat-free milk.  6 oz artificially sweetened yogurt or plain yogurt.  1 cup low-fat buttermilk.  1 cup  soy milk.  1 cup almond milk. Meat/Protein  1 large egg.  2 to 3 oz meat, poultry, or fish.   cup low-fat cottage cheese.  1 tbs peanut butter.  1 oz low-fat cheese.   cup tuna in water.   cup tofu. Fat  1 tsp oil.  1 tsp trans-fat-free margarine.  1 tsp butter.  1 tsp mayonnaise.  2 tbs avocado.  1 tbs salad dressing.  1 tbs cream cheese.  2 tbs sour cream. SAMPLE 1800 CALORIE DIET PLAN Breakfast   cup unsweetened cereal (1 carb serving).  1 cup fat-free milk (1 carb  serving).  1 slice whole-wheat toast (1 carb serving).   small banana (1 carb serving).  1 scrambled egg.  1 tsp trans-fat-free margarine. Lunch  Tuna sandwich.  2 slices whole-wheat bread (2 carb servings).   cup canned tuna in water, drained.  1 tbs reduced fat mayonnaise.  1 stalk celery, chopped.  2 slices tomato.  1 lettuce leaf.  1 cup carrot sticks.  24 to 30 seedless grapes (2 carb servings).  6 oz light yogurt (1 carb serving). Afternoon Snack  3 graham cracker squares (1 carb serving).  Fat-free milk, 1 cup (1 carb serving).  1 tbs peanut butter. Dinner  3 oz salmon, broiled with 1 tsp oil.  1 cup mashed potatoes (2 carb servings) with 1 tsp trans-fat-free margarine.  1 cup fresh or frozen green beans.  1 cup steamed asparagus.  1 cup fat-free milk (1 carb serving). Evening Snack  3 cups air-popped popcorn (1 carb serving).  2 tbs parmesan cheese sprinkled on top. MEAL PLAN Use this worksheet to help you make a daily meal plan based on the 1800 calorie diet suggestions. If you are using this plan to help you control your blood glucose, you may interchange carbohydrate-containing foods (dairy, starches, and fruits). Select a variety of fresh foods of varying colors and flavors. The total amount of carbohydrate in your meals or snacks is more important than making sure you include all of the food groups every time you eat. Choose from the following foods to build your day's meals:  8 Starches.  4 Vegetables.  3 Fruits.  2 Dairy.  6 to 7 oz Meat/Protein.  Up to 4 Fats. Your dietician can use this worksheet to help you decide how many servings and which types of foods are right for you. BREAKFAST Food Group and Servings / Food Choice Starch ________________________________________________________ Dairy _________________________________________________________ Fruit _________________________________________________________ Meat/Protein  __________________________________________________ Fat ___________________________________________________________ LUNCH Food Group and Servings / Food Choice Starch ________________________________________________________ Meat/Protein __________________________________________________ Vegetable _____________________________________________________ Fruit _________________________________________________________ Dairy _________________________________________________________ Fat ___________________________________________________________ Randy Ballard Food Group and Servings / Food Choice Starch ________________________________________________________ Meat/Protein __________________________________________________ Fruit __________________________________________________________ Dairy _________________________________________________________ Randy Ballard Food Group and Servings / Food Choice Starch _________________________________________________________ Meat/Protein ___________________________________________________ Dairy __________________________________________________________ Vegetable ______________________________________________________ Fruit ___________________________________________________________ Fat ____________________________________________________________ Randy Ballard Food Group and Servings / Food Choice Fruit __________________________________________________________ Meat/Protein ___________________________________________________ Dairy __________________________________________________________ Starch _________________________________________________________ DAILY TOTALS Starch ____________________________ Vegetable _________________________ Fruit _____________________________ Dairy _____________________________ Meat/Protein______________________ Fat _______________________________ Document Released: 04/17/2005 Document Revised: 12/18/2011 Document Reviewed:  08/11/2011 [employer] Patient Information 2014 [employer]

## 2013-04-13 NOTE — Progress Notes (Signed)
Subjective:    Patient ID: Randy Ballard, male    DOB: 1961/09/26, 52 y.o.   MRN: [id number] Chief Complaint  Patient presents with  . Follow-up    stomach issues    HPI  Is here with now 12 days of constipation, progressively worsening.  He has had this in the past so he started taking milk of magnesia without relief.  Past Medical History  Diagnosis Date  . Diabetes mellitus without complication   . Hyperlipidemia   . Hypertension   . Hypogonadism male   . 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 Diverticulitis    Current Outpatient Prescriptions on File Prior to Visit  Medication Sig Dispense Refill  . aspirin 81 MG tablet Take 1 tablet (81 mg total) by mouth daily.  30 tablet  11   No current facility-administered medications on file prior to visit.   No Known Allergies   Review of Systems  Constitutional: Positive for appetite change and fatigue. Negative for fever, chills, diaphoresis and activity change.  Respiratory: Negative for shortness of breath.   Cardiovascular: Negative for chest pain.  Gastrointestinal: Positive for abdominal pain, constipation and abdominal distention. Negative for nausea, vomiting, diarrhea, blood in stool, anal bleeding and rectal pain.  Genitourinary: Negative for dysuria, frequency and decreased urine volume.  Hematological: Negative for adenopathy.      BP 136/80  Pulse 46  Temp(Src) 97.2 F (36.2 C) (Oral)  Resp 18  Ht 5\' 8"  (1.727 m)  Wt 217 lb (98.431 kg)  BMI 33 kg/m2  SpO2 98% Objective:   Physical Exam  Constitutional: He appears well-developed and well-nourished. No distress.  HENT:  Head: Normocephalic and atraumatic.  Neck: Normal range of motion. Neck supple. No thyromegaly present.  Cardiovascular: Normal rate, regular rhythm and normal heart sounds.   Pulmonary/Chest: Effort normal and breath sounds normal.  Abdominal: Soft. Normal appearance and bowel sounds are  normal. He exhibits distension. He exhibits no mass. There is generalized tenderness. There is no rebound, no guarding and no CVA tenderness. No hernia.  Genitourinary: Rectum normal and prostate normal. Rectal exam shows no tenderness and anal tone normal. Guaiac negative stool.  Lymphadenopathy:    He has no cervical adenopathy.  Skin: He is not diaphoretic.      Results for orders placed in visit on 04/13/13  POCT CBC      Result Value Range   WBC 14.7 (*) 4.6 - 10.2 K/uL   Lymph, poc 2.0  0.6 - 3.4   POC LYMPH PERCENT 13.3  10 - 50 %L   MID (cbc) 1.0 (*) 0 - 0.9   POC MID % 6.9  0 - 12 %M   POC Granulocyte 11.7 (*) 2 - 6.9   Granulocyte percent 79.8  37 - 80 %G   RBC 4.79  4.69 - 6.13 M/uL   Hemoglobin 14.9  14.1 - 18.1 g/dL   HCT, POC 40.9  81.1 - 53.7 %   MCV 95.8  80 - 97 fL   MCH, POC 31.1  27 - 31.2 pg   MCHC 32.5  31.8 - 35.4 g/dL   RDW, POC 91.4     Platelet Count, POC 314  142 - 424 K/uL   MPV 9.9  0 - 99.8 fL  POCT GLYCOSYLATED HEMOGLOBIN (HGB A1C)      Result Value Range   Hemoglobin A1C 7.6     EKG: NSR, no acute ischemic  changes Assessment & Plan:  Unspecified constipation - Plan: POCT CBC, POCT glycosylated hemoglobin (Hb A1C), TSH, Comprehensive metabolic panel  Diverticulitis of colon without hemorrhage - Plan: POCT CBC, POCT glycosylated hemoglobin (Hb A1C), TSH, Comprehensive metabolic panel   Type II or unspecified type diabetes mellitus without mention of complication, uncontrolled - Plan: EKG 12-Lead  Anemia - Plan: EKG 12-Lead  Hypothyroid - recheck tsh - last tsh mildly elevated which may be what is contributing to constipation.  Meds ordered this encounter  Medications  . Lactulose 20 GM/30ML SOLN    Sig: Take 30 mLs (20 g total) by mouth 2 (two) times daily.    Dispense:  240 mL    Refill:  1

## 2013-04-14 LAB — COMPREHENSIVE METABOLIC PANEL
BUN: 15 mg/dL (ref 6–23)
CO2: 28 mEq/L (ref 19–32)
Calcium: 10 mg/dL (ref 8.4–10.5)
Chloride: 97 mEq/L (ref 96–112)
Creat: 1.17 mg/dL (ref 0.50–1.35)
Glucose, Bld: 191 mg/dL — ABNORMAL HIGH (ref 70–99)

## 2013-04-16 ENCOUNTER — Inpatient Hospital Stay (HOSPITAL_COMMUNITY)
Admission: EM | Admit: 2013-04-16 | Discharge: 2013-04-18 | DRG: 204 | Disposition: A | Discharge status: Home / Self Care | Payer: Federal, State, Local not specified - PPO | Attending: Internal Medicine | Admitting: Internal Medicine

## 2013-04-16 ENCOUNTER — Emergency Department (HOSPITAL_COMMUNITY): Payer: Federal, State, Local not specified - PPO

## 2013-04-16 ENCOUNTER — Encounter (HOSPITAL_COMMUNITY): Payer: Self-pay

## 2013-04-16 DIAGNOSIS — E1139 Type 2 diabetes mellitus with other diabetic ophthalmic complication: Secondary | ICD-10-CM | POA: Diagnosis present

## 2013-04-16 DIAGNOSIS — Z6834 Body mass index (BMI) 34.0-34.9, adult: Secondary | ICD-10-CM

## 2013-04-16 DIAGNOSIS — E291 Testicular hypofunction: Secondary | ICD-10-CM | POA: Diagnosis present

## 2013-04-16 DIAGNOSIS — Z7982 Long term (current) use of aspirin: Secondary | ICD-10-CM

## 2013-04-16 DIAGNOSIS — E039 Hypothyroidism, unspecified: Secondary | ICD-10-CM | POA: Diagnosis present

## 2013-04-16 DIAGNOSIS — K859 Acute pancreatitis without necrosis or infection, unspecified: Principal | ICD-10-CM | POA: Diagnosis present

## 2013-04-16 DIAGNOSIS — Z79899 Other long term (current) drug therapy: Secondary | ICD-10-CM

## 2013-04-16 DIAGNOSIS — I10 Essential (primary) hypertension: Secondary | ICD-10-CM | POA: Diagnosis present

## 2013-04-16 DIAGNOSIS — Z8719 Personal history of other diseases of the digestive system: Secondary | ICD-10-CM

## 2013-04-16 DIAGNOSIS — E785 Hyperlipidemia, unspecified: Secondary | ICD-10-CM

## 2013-04-16 DIAGNOSIS — D72829 Elevated white blood cell count, unspecified: Secondary | ICD-10-CM

## 2013-04-16 DIAGNOSIS — E119 Type 2 diabetes mellitus without complications: Secondary | ICD-10-CM | POA: Diagnosis present

## 2013-04-16 DIAGNOSIS — F17200 Nicotine dependence, unspecified, uncomplicated: Secondary | ICD-10-CM | POA: Diagnosis present

## 2013-04-16 HISTORY — DX: Diverticulitis of intestine, part unspecified, without perforation or abscess without bleeding: K57.92

## 2013-04-16 LAB — CBC WITH DIFFERENTIAL/PLATELET
Basophils Absolute: 0 10*3/uL (ref 0.0–0.1)
Basophils Relative: 0 % (ref 0–1)
Eosinophils Absolute: 0.1 10*3/uL (ref 0.0–0.7)
Hemoglobin: 14.6 g/dL (ref 13.0–17.0)
MCHC: 34.3 g/dL (ref 30.0–36.0)
Neutro Abs: 11.3 10*3/uL — ABNORMAL HIGH (ref 1.7–7.7)
Neutrophils Relative %: 80 % — ABNORMAL HIGH (ref 43–77)
Platelets: 331 10*3/uL (ref 150–400)
RDW: 11.5 % (ref 11.5–15.5)

## 2013-04-16 LAB — HEPATIC FUNCTION PANEL
Bilirubin, Direct: <0.1 mg/dL (ref 0.0–0.3)
Total Bilirubin: 0.4 mg/dL (ref 0.3–1.2)
Total Protein: 7.9 g/dL (ref 6.0–8.3)

## 2013-04-16 LAB — URINALYSIS, ROUTINE W REFLEX MICROSCOPIC
Hgb urine dipstick: NEGATIVE
Nitrite: NEGATIVE
Protein, ur: 30 mg/dL — AB
Specific Gravity, Urine: >1.046 — ABNORMAL HIGH (ref 1.005–1.030)
Urobilinogen, UA: 0.2 mg/dL (ref 0.0–1.0)

## 2013-04-16 LAB — ACETAMINOPHEN LEVEL: Acetaminophen (Tylenol), Serum: <15 ug/mL (ref 10–30)

## 2013-04-16 LAB — POCT I-STAT, CHEM 8
Creatinine, Ser: 1 mg/dL (ref 0.50–1.35)
Glucose, Bld: 277 mg/dL — ABNORMAL HIGH (ref 70–99)
HCT: 46 % (ref 39.0–52.0)
Hemoglobin: 15.6 g/dL (ref 13.0–17.0)
Sodium: 135 mEq/L (ref 135–145)
TCO2: 25 mmol/L (ref 0–100)

## 2013-04-16 LAB — TSH: TSH: 4.759 u[IU]/mL — ABNORMAL HIGH (ref 0.350–4.500)

## 2013-04-16 LAB — CREATININE, SERUM
Creatinine, Ser: 0.95 mg/dL (ref 0.50–1.35)
GFR calc Af Amer: >90 mL/min (ref >90)

## 2013-04-16 LAB — LIPASE, BLOOD: Lipase: 1029 U/L — ABNORMAL HIGH (ref 11–59)

## 2013-04-16 LAB — HEMOGLOBIN A1C: Mean Plasma Glucose: 194 mg/dL — ABNORMAL HIGH (ref <117)

## 2013-04-16 LAB — GLUCOSE, CAPILLARY
Glucose-Capillary: 268 mg/dL — ABNORMAL HIGH (ref 70–99)
Glucose-Capillary: 213 mg/dL — ABNORMAL HIGH (ref 70–99)

## 2013-04-16 LAB — LIPID PANEL: Cholesterol: 93 mg/dL (ref 0–200)

## 2013-04-16 LAB — URINE MICROSCOPIC-ADD ON

## 2013-04-16 MED ORDER — METOPROLOL SUCCINATE ER 100 MG PO TB24
100.0000 mg | ORAL_TABLET | Freq: Every day | ORAL | Status: DC
  Administered 2013-04-17 – 2013-04-18 (×2): 100 mg via ORAL
  Filled 2013-04-16 (×2): qty 1

## 2013-04-16 MED ORDER — SODIUM CHLORIDE 0.9 % IJ SOLN: 9.0000 mL | INTRAMUSCULAR | Status: DC | PRN

## 2013-04-16 MED ORDER — LEVOTHYROXINE SODIUM 112 MCG PO TABS
112.0000 ug | ORAL_TABLET | Freq: Every day | ORAL | Status: DC
  Administered 2013-04-17 – 2013-04-18 (×2): 112 ug via ORAL
  Filled 2013-04-16 (×3): qty 1

## 2013-04-16 MED ORDER — NALOXONE HCL 0.4 MG/ML IJ SOLN: 0.4000 mg | INTRAMUSCULAR | Status: DC | PRN

## 2013-04-16 MED ORDER — ONDANSETRON HCL 4 MG/2ML IJ SOLN: 4.0000 mg | Freq: Four times a day (QID) | INTRAMUSCULAR | Status: DC | PRN

## 2013-04-16 MED ORDER — ASPIRIN 81 MG PO TABS: 81.0000 mg | ORAL_TABLET | Freq: Every day | ORAL | Status: DC

## 2013-04-16 MED ORDER — ASPIRIN 81 MG PO CHEW
81.0000 mg | CHEWABLE_TABLET | Freq: Every day | ORAL | Status: DC
  Administered 2013-04-17 – 2013-04-18 (×2): 81 mg via ORAL
  Filled 2013-04-16 (×2): qty 1

## 2013-04-16 MED ORDER — ACETAMINOPHEN 500 MG PO TABS: 1000.0000 mg | ORAL_TABLET | Freq: Four times a day (QID) | ORAL | Status: DC | PRN

## 2013-04-16 MED ORDER — SODIUM CHLORIDE 0.9 % IV BOLUS (SEPSIS)
500.0000 mL | Freq: Once | INTRAVENOUS | Status: AC
  Administered 2013-04-16: 500 mL via INTRAVENOUS

## 2013-04-16 MED ORDER — ONDANSETRON HCL 4 MG/2ML IJ SOLN: 4.0000 mg | Freq: Three times a day (TID) | INTRAMUSCULAR | Status: DC | PRN

## 2013-04-16 MED ORDER — INSULIN DETEMIR 100 UNIT/ML ~~LOC~~ SOLN
20.0000 [IU] | Freq: Every day | SUBCUTANEOUS | Status: DC
  Administered 2013-04-16 – 2013-04-17 (×2): 20 [IU] via SUBCUTANEOUS
  Filled 2013-04-16 (×3): qty 0.2

## 2013-04-16 MED ORDER — ZOLPIDEM TARTRATE 5 MG PO TABS
5.0000 mg | ORAL_TABLET | Freq: Every evening | ORAL | Status: DC | PRN
  Administered 2013-04-17: 5 mg via ORAL
  Filled 2013-04-16: qty 1

## 2013-04-16 MED ORDER — ONDANSETRON HCL 4 MG PO TABS: 4.0000 mg | ORAL_TABLET | Freq: Four times a day (QID) | ORAL | Status: DC | PRN

## 2013-04-16 MED ORDER — DIPHENHYDRAMINE HCL 12.5 MG/5ML PO ELIX: 12.5000 mg | ORAL_SOLUTION | Freq: Four times a day (QID) | ORAL | Status: DC | PRN

## 2013-04-16 MED ORDER — IOHEXOL 300 MG/ML  SOLN
50.0000 mL | Freq: Once | INTRAMUSCULAR | Status: AC | PRN
  Administered 2013-04-16: 50 mL via ORAL

## 2013-04-16 MED ORDER — POLYETHYLENE GLYCOL 3350 17 G PO PACK
17.0000 g | PACK | Freq: Every day | ORAL | Status: DC | PRN
  Filled 2013-04-16: qty 1

## 2013-04-16 MED ORDER — DIPHENHYDRAMINE HCL 50 MG/ML IJ SOLN: 12.5000 mg | Freq: Four times a day (QID) | INTRAMUSCULAR | Status: DC | PRN

## 2013-04-16 MED ORDER — HEPARIN SODIUM (PORCINE) 5000 UNIT/ML IJ SOLN
5000.0000 [IU] | Freq: Three times a day (TID) | INTRAMUSCULAR | Status: DC
  Administered 2013-04-16 – 2013-04-18 (×5): 5000 [IU] via SUBCUTANEOUS
  Filled 2013-04-16 (×8): qty 1

## 2013-04-16 MED ORDER — DEXTROSE-NACL 5-0.45 % IV SOLN
INTRAVENOUS | Status: DC
  Administered 2013-04-16 – 2013-04-17 (×2): via INTRAVENOUS

## 2013-04-16 MED ORDER — INSULIN ASPART 100 UNIT/ML ~~LOC~~ SOLN
0.0000 [IU] | SUBCUTANEOUS | Status: DC
  Administered 2013-04-16: 5 [IU] via SUBCUTANEOUS
  Administered 2013-04-16: 2 [IU] via SUBCUTANEOUS
  Administered 2013-04-17: 3 [IU] via SUBCUTANEOUS
  Administered 2013-04-17 (×2): 5 [IU] via SUBCUTANEOUS
  Administered 2013-04-17 (×2): 3 [IU] via SUBCUTANEOUS
  Administered 2013-04-17: 5 [IU] via SUBCUTANEOUS
  Administered 2013-04-18 (×3): 3 [IU] via SUBCUTANEOUS

## 2013-04-16 MED ORDER — SODIUM CHLORIDE 0.9 % IV BOLUS (SEPSIS): 1000.0000 mL | Freq: Once | INTRAVENOUS | Status: AC

## 2013-04-16 MED ORDER — ATORVASTATIN CALCIUM 40 MG PO TABS
40.0000 mg | ORAL_TABLET | Freq: Every evening | ORAL | Status: DC
  Administered 2013-04-17: 40 mg via ORAL
  Filled 2013-04-16 (×3): qty 1

## 2013-04-16 MED ORDER — IOHEXOL 300 MG/ML  SOLN
100.0000 mL | Freq: Once | INTRAMUSCULAR | Status: AC | PRN
  Administered 2013-04-16: 100 mL via INTRAVENOUS

## 2013-04-16 MED ORDER — MORPHINE SULFATE (PF) 1 MG/ML IV SOLN
INTRAVENOUS | Status: DC
  Administered 2013-04-16: 17:00:00 via INTRAVENOUS
  Administered 2013-04-17: 18 mg via INTRAVENOUS
  Administered 2013-04-17: via INTRAVENOUS
  Filled 2013-04-16 (×2): qty 25

## 2013-04-16 NOTE — H&P (Signed)
[facility] History and Physical  Randy Ballard ZOX:[id number] DOB: May 07, 1961 DOA: 04/16/2013  Referring physician: Dr. Rubin Payor PCP: No primary provider on file.  Specialists: none  Chief Complaint: abdominal pain  HPI: Randy Ballard is a 52 y.o. male  Past medical history of diabetes mellitus With an unknown hemoglobin A1c, hypertension started having abdominal pain 2 weeks prior to admission, when he saw his primary care doctor 1 week, at bedtime he was also having diarrhea as he was prescribed lactulose and he was treated empirically with Cipro and Flagyl. He relates his pain progressively got worse to the point where he can tolerate anything by mouth. He radiates to his back. Pain on his back or right side makes it worst, Tylenol has made better. Denies fever chills, weight loss. Melanotic stools or bright red blood per rectum. No hematemesis nausea or vomiting.  In the ED:   basic metabolic panel was done that showed a blood glucose of 200, CBC was done that showed leukocytosis of 14 with a left shift. Lipase was checked which was elevated at 1029. UA was done that showed an elevated specific gravity was greater than 1000 blood glucose CT scan of the abdomen and pelvis with contrast was done to show mildly acute pancreatitis. So we were asked to further evaluate.   Review of Systems: The patient denies anorexia, fever, weight loss,, vision loss, decreased hearing, hoarseness, chest pain, syncope, dyspnea on exertion, peripheral edema, balance deficits, hemoptysis,  melena, hematochezia, , hematuria, incontinence, genital sores, muscle weakness, suspicious skin lesions, transient blindness, difficulty walking, depression, unusual weight change, abnormal bleeding, enlarged lymph nodes, angioedema, and breast masses.    Past Medical History  Diagnosis Date  . Diabetes mellitus without complication   . Hyperlipidemia   . Hypertension   . Hypogonadism male   . Thyroid disease  02/06/2009    [facility] s/p RAIU; cold nodule s/p negative biopsy  . Diverticulitis    Past Surgical History  Procedure Laterality Date  . Biopsy thyroid  02/06/2009    cold thyroid nodule; negative/benign.  . Colonoscopy w/ polypectomy  Feb 2014    5 polyps   Social History:  reports that he has been smoking Cigarettes.  He has a 30 pack-year smoking history. He has never used smokeless tobacco. He reports that he does not drink alcohol or use illicit drugs. Lives at home with wife.   No Known Allergies  Family History  Problem Relation Age of Onset  . Adopted: Yes     Prior to Admission medications   Medication Sig Start Date End Date Taking? Authorizing Provider  acetaminophen (TYLENOL) 500 MG tablet Take 1,000-1,500 mg by mouth every 6 (six) hours as needed for pain.   Yes Historical Provider, MD  aspirin 81 MG tablet Take 1 tablet (81 mg total) by mouth daily. 12/23/11  Yes Dois Davenport, MD  atorvastatin (LIPITOR) 40 MG tablet Take 1 tablet (40 mg total) by mouth daily. 10/27/12  Yes Ethelda Chick, MD  ciprofloxacin (CIPRO) 500 MG tablet Take 500 mg by mouth 2 (two) times daily. 04/10/13 04/17/13 Yes Historical Provider, MD  glimepiride (AMARYL) 4 MG tablet Take 1 tablet (4 mg total) by mouth daily before breakfast. 10/27/12  Yes Ethelda Chick, MD  Lactulose 20 GM/30ML SOLN Take 30 mLs (20 g total) by mouth 2 (two) times daily. 04/13/13  Yes [name], MD  levothyroxine (SYNTHROID, LEVOTHROID) 112 MCG tablet Take 1 tablet (112 mcg total) by mouth  daily. 10/27/12  Yes Ethelda Chick, MD  lisinopril-hydrochlorothiazide (PRINZIDE,ZESTORETIC) 20-25 MG per tablet Take 1 tablet by mouth daily. 10/27/12  Yes Ethelda Chick, MD  metFORMIN (GLUCOPHAGE) 1000 MG tablet Take 1 tablet (1,000 mg total) by mouth 2 (two) times daily with a meal. 10/27/12  Yes Ethelda Chick, MD  metoprolol succinate (TOPROL-XL) 100 MG 24 hr tablet Take 1 tablet (100 mg total) by mouth daily. Take with or immediately  following a meal. 10/27/12  Yes Ethelda Chick, MD  metroNIDAZOLE (FLAGYL) 500 MG tablet Take 500 mg by mouth 3 (three) times daily. 04/10/13 04/17/13 Yes Historical Provider, MD  sitaGLIPtin (JANUVIA) 100 MG tablet Take 1 tablet (100 mg total) by mouth daily. 10/27/12  Yes Ethelda Chick, MD   Physical Exam: Filed Vitals:   04/16/13 1146  BP: 136/65  Pulse: 95  Temp: 98.5 F (36.9 C)  TempSrc: Oral  Resp: 18  Height: 5\' 7"  (1.702 m)  Weight: 97.523 kg (215 lb)  SpO2: 100%    BP 136/65  Pulse 95  Temp(Src) 98.5 F (36.9 C) (Oral)  Resp 18  Ht 5\' 7"  (1.702 m)  Wt 97.523 kg (215 lb)  BMI 33.67 kg/m2  SpO2 100%  General Appearance:    Alert, cooperative, no distress, appears stated age           Nose:   Nares normal, septum midline, mucosa normal, no drainage    or sinus tenderness  Throat:   Lips, mucosa, and tongue normal; teeth and gums normal  Neck:   Supple, symmetrical, trachea midline, no adenopathy;       thyroid:  No enlargement/tenderness/nodules; no carotid   bruit or JVD  Back:     Symmetric, no curvature, ROM normal, no CVA tenderness  Lungs:     Clear to auscultation bilaterally, respirations unlabored  Chest wall:    No tenderness or deformity  Heart:    Regular rate and rhythm, S1 and S2 normal, no murmur, rub   or gallop  Abdomen:     Soft, tender in the epigastric area and right upper quadrant Murphy sign negative. , bowel sounds active all four quadrants,   no masses, no organomegaly, no CVA tenderness.        Extremities:   Extremities normal, atraumatic, no cyanosis or edema  Pulses:   2+ and symmetric all extremities  Skin:   Skin color, texture, turgor normal, no rashes or lesions  Lymph nodes:   Cervical, supraclavicular, and axillary nodes normal  Neurologic:   CNII-XII intact. Normal strength, sensation and reflexes      throughout     Labs on Admission:  Basic Metabolic Panel:  Recent Labs Lab 04/13/13 1546 04/16/13 1242  NA 135 135  K  4.3 3.9  CL 97 100  CO2 28  --   GLUCOSE 191* 277*  BUN 15 19  CREATININE 1.17 1.00  CALCIUM 10.0  --    Liver Function Tests:  Recent Labs Lab 04/13/13 1546 04/16/13 1215  AST 19 18  ALT 27 23  ALKPHOS 105 104  BILITOT 0.4 0.4  PROT 7.6 7.9  ALBUMIN 4.5 3.8    Recent Labs Lab 04/16/13 1215  LIPASE 1029*   No results found for this basename: AMMONIA,  in the last 168 hours CBC:  Recent Labs Lab 04/16/13 1215 04/16/13 1242  WBC 14.0*  --   NEUTROABS 11.3*  --   HGB 14.6 15.6  HCT 42.6 46.0  MCV  89.1  --   PLT 331  --    Cardiac Enzymes: No results found for this basename: CKTOTAL, CKMB, CKMBINDEX, TROPONINI,  in the last 168 hours  BNP (last 3 results) No results found for this basename: PROBNP,  in the last 8760 hours CBG:  Recent Labs Lab 04/16/13 1151  GLUCAP 268*    Radiological Exams on Admission: Ct Abdomen Pelvis W Contrast  04/16/2013   *RADIOLOGY REPORT*  Clinical Data: Abdominal pain, distention, constipation, elevated white count and lipase.  CT ABDOMEN AND PELVIS WITH CONTRAST  Technique:  Multidetector CT imaging of the abdomen and pelvis was performed following the standard protocol during bolus administration of intravenous contrast.  Contrast: 50mL OMNIPAQUE IOHEXOL 300 MG/ML  SOLN, OMNIPAQUE IOHEXOL 300 MG/ML  SOLN  Comparison: None.  Findings: Minimal dependent basilar atelectasis, otherwise clear lower lobes.  Normal heart size.  No pericardial or pleural effusion.  No hiatal hernia.  Abdomen:  Liver, gallbladder, biliary system, and spleen are within normal limits for age and demonstrate no acute process. Nonspecific mild thickening of the adrenal glands without focal nodule or mass.  This can be seen with mild hyperplasia.  Kidneys demonstrate sub centimeter scattered hypodense cortical cysts bilaterally.  No hydronephrosis or obstruction.  Symmetric renal enhancement and excretion.  Pancreas demonstrates minimal peri pancreatic  stranding edema most pronounced of the pancreatic head and uncinate process compatible with mild acute pancreatitis.  No fluid collection to suggest pseudocyst or abscess formation.  Mesenteric and splenic veins are all patent.  No ductal dilatation.  Negative for bowel obstruction, dilatation, ileus, or free air.  No abdominal free fluid, fluid collection, hemorrhage, or adenopathy.  Normal appendix demonstrated.  Aortic atherosclerosis without aneurysm.  Pelvis:  No pelvic free fluid, fluid collection, hemorrhage, abscess, adenopathy, inguinal abnormality, or hernia.  Urinary bladder unremarkable.  No acute distal bowel process.  Degenerative changes of the lower thoracic and lumbar spine.  Facet arthropathy most pronounced at L5 S1.  No compression fracture or wedge-shaped deformity.  IMPRESSION: Mild acute pancreatitis without other complicating feature.   Original Report Authenticated By: Judie Petit. Miles Costain, M.D.    EKG: Independently reviewed. None  Assessment/Plan Acute pancreatitis - Lipase was elevated here in the emergency room, CT scan of the abdomen does not show gallbladder disease or stone. He is on Januvia which is a well known drug which can cause acute pancreatitis. He but he has been on this more than a year. He's also on hydrochlorothiazide he is known to cause pancreatitis, which he has been on it for years. Stopped both of these.  - We'll check a fasting lipid panel to rule out any hypertriglyceridemia. He he denies any alcohol use. Level is less than 11. We'll place him n.p.o., start him on IV fluids and narcotics for pain.  - Check TSH, hemoglobinA1c.  - Use PCA pump.  HTN (hypertension): - We'll hold his hydrochlorothiazide and ACE inhibitor. Continue metoprolol. His blood pressure seems to be well controlled  DM type 2 (diabetes mellitus, type 2): - 100 and disease Januvia and his metformin. We'll start him sliding scale insulin and long-acting insulin. 6 ABGs a.c. and at  bedtime.  Hypothyroid: - Continue Synthroid check a TSH.  Leukocytosis: - Probably stress emargination see acute pancreatitis for further details. No diarrhea no bloody stools or vomiting.  Code Status: full Family Communication: none Disposition Plan: inpatient 2-3 days  Time spent: 75 minutes  Marinda Elk [facility] Pager [phone]  If 7PM-7AM,  please contact night-coverage [email or web address] Password [email or web address] 04/16/2013, 3:52 PM

## 2013-04-16 NOTE — ED Notes (Signed)
Pt states dx with diverticulosis and tx'd with antibotic, pt c/o abdominal pain radiating through to back, denies n/v/d

## 2013-04-16 NOTE — Progress Notes (Signed)
Patient states his pcp is Dr. Clelia Croft at [facility].

## 2013-04-16 NOTE — Progress Notes (Signed)
UR completed 

## 2013-04-16 NOTE — ED Provider Notes (Signed)
History    CSN: [id number] Arrival date & time 04/16/13  1133  First MD Initiated Contact with Patient 04/16/13 1300     Chief Complaint  Patient presents with  . Abdominal Pain   (Consider location/radiation/quality/duration/timing/severity/associated sxs/prior Treatment) Patient is a 52 y.o. male presenting with abdominal pain. The history is provided by the patient.  Abdominal Pain This is a new problem. Associated symptoms include abdominal pain. Pertinent negatives include no chest pain, no headaches and no shortness of breath.   patient has had abdominal pain for last week. He had seen by his primary care Dr. and was started on antibiotics. He is a history of diverticulitis and he states that he was diagnosed with. He states he's continued to have pain is gotten worse. He states that he was taking 2-3 pills of Tylenol every 2-3 hours last night. He's had nausea without vomiting. Dysuria. No lightheadedness dizziness. No fevers. He states that the pain is in his upper abdomen and goes to the left side.   Past Medical History  Diagnosis Date  . Diabetes mellitus without complication   . Hyperlipidemia   . Hypertension   . Hypogonadism male   . 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 Diverticulitis    Past Surgical History  Procedure Laterality Date  . Biopsy thyroid  02/06/2009    cold thyroid nodule; negative/benign.   No family history on file. History  Substance Use Topics  . Smoking status: Current Some Day Smoker -- 1.00 packs/day for 30 years  . Smokeless tobacco: Not on file  . Alcohol Use: No    Review of Systems  Constitutional: Negative for fever, activity change and appetite change.  HENT: Negative for neck stiffness.   Eyes: Negative for pain.  Respiratory: Negative for chest tightness and shortness of breath.   Cardiovascular: Negative for chest pain and leg swelling.  Gastrointestinal: Positive for nausea and  abdominal pain. Negative for vomiting and diarrhea.  Genitourinary: Negative for flank pain.  Musculoskeletal: Negative for back pain.  Skin: Negative for rash.  Neurological: Negative for weakness, numbness and headaches.  Psychiatric/Behavioral: Negative for behavioral problems.    Allergies  Review of patient's allergies indicates no known allergies.  Home Medications   Current Outpatient Rx  Name  Route  Sig  Dispense  Refill  . acetaminophen (TYLENOL) 500 MG tablet   Oral   Take 1,000-1,500 mg by mouth every 6 (six) hours as needed for pain.         Marland Kitchen aspirin 81 MG tablet   Oral   Take 1 tablet (81 mg total) by mouth daily.   30 tablet   11   . atorvastatin (LIPITOR) 40 MG tablet   Oral   Take 1 tablet (40 mg total) by mouth daily.   30 tablet   5   . ciprofloxacin (CIPRO) 500 MG tablet   Oral   Take 500 mg by mouth 2 (two) times daily.         Marland Kitchen glimepiride (AMARYL) 4 MG tablet   Oral   Take 1 tablet (4 mg total) by mouth daily before breakfast.   30 tablet   5   . Lactulose 20 GM/30ML SOLN   Oral   Take 30 mLs (20 g total) by mouth 2 (two) times daily.   240 mL   1   . levothyroxine (SYNTHROID, LEVOTHROID) 112 MCG tablet   Oral   Take  1 tablet (112 mcg total) by mouth daily.   90 tablet   2   . lisinopril-hydrochlorothiazide (PRINZIDE,ZESTORETIC) 20-25 MG per tablet   Oral   Take 1 tablet by mouth daily.   30 tablet   5   . metFORMIN (GLUCOPHAGE) 1000 MG tablet   Oral   Take 1 tablet (1,000 mg total) by mouth 2 (two) times daily with a meal.   60 tablet   5   . metoprolol succinate (TOPROL-XL) 100 MG 24 hr tablet   Oral   Take 1 tablet (100 mg total) by mouth daily. Take with or immediately following a meal.   30 tablet   5   . metroNIDAZOLE (FLAGYL) 500 MG tablet   Oral   Take 500 mg by mouth 3 (three) times daily.         . sitaGLIPtin (JANUVIA) 100 MG tablet   Oral   Take 1 tablet (100 mg total) by mouth daily.   30  tablet   5    BP 136/65  Pulse 95  Temp(Src) 98.5 F (36.9 C) (Oral)  Resp 18  Ht 5\' 7"  (1.702 m)  Wt 215 lb (97.523 kg)  BMI 33.67 kg/m2  SpO2 100% Physical Exam  Nursing note and vitals reviewed. Constitutional: He is oriented to person, place, and time. He appears well-developed and well-nourished.  HENT:  Head: Normocephalic and atraumatic.  Eyes: EOM are normal. Pupils are equal, round, and reactive to light.  Neck: Normal range of motion. Neck supple.  Cardiovascular: Normal rate, regular rhythm and normal heart sounds.   No murmur heard. Pulmonary/Chest: Effort normal and breath sounds normal.  Abdominal: Soft. Bowel sounds are normal. He exhibits no distension and no mass. There is tenderness. There is no rebound and no guarding.  Mild upper abdominal tenderness without rebound or guarding. Mild left upper abdominal tenderness also.  Musculoskeletal: Normal range of motion. He exhibits no edema.  Neurological: He is alert and oriented to person, place, and time. No cranial nerve deficit.  Skin: Skin is warm and dry.  Psychiatric: He has a normal mood and affect.    ED Course  Procedures (including critical care time) Labs Reviewed  GLUCOSE, CAPILLARY - Abnormal; Notable for the following:    Glucose-Capillary 268 (*)    All other components within normal limits  CBC WITH DIFFERENTIAL - Abnormal; Notable for the following:    WBC 14.0 (*)    Neutrophils Relative % 80 (*)    Neutro Abs 11.3 (*)    Lymphocytes Relative 9 (*)    Monocytes Absolute 1.4 (*)    All other components within normal limits  URINALYSIS, ROUTINE W REFLEX MICROSCOPIC - Abnormal; Notable for the following:    Color, Urine AMBER (*)    Specific Gravity, Urine >1.046 (*)    Glucose, UA >1000 (*)    Bilirubin Urine SMALL (*)    Ketones, ur 40 (*)    Protein, ur 30 (*)    Leukocytes, UA SMALL (*)    All other components within normal limits  LIPASE, BLOOD - Abnormal; Notable for the  following:    Lipase 1029 (*)    All other components within normal limits  POCT I-STAT, CHEM 8 - Abnormal; Notable for the following:    Glucose, Bld 277 (*)    All other components within normal limits  URINE CULTURE  HEPATIC FUNCTION PANEL  ACETAMINOPHEN LEVEL  URINE MICROSCOPIC-ADD ON   Ct Abdomen Pelvis W Contrast  04/16/2013   *RADIOLOGY REPORT*  Clinical Data: Abdominal pain, distention, constipation, elevated white count and lipase.  CT ABDOMEN AND PELVIS WITH CONTRAST  Technique:  Multidetector CT imaging of the abdomen and pelvis was performed following the standard protocol during bolus administration of intravenous contrast.  Contrast: 50mL OMNIPAQUE IOHEXOL 300 MG/ML  SOLN, OMNIPAQUE IOHEXOL 300 MG/ML  SOLN  Comparison: None.  Findings: Minimal dependent basilar atelectasis, otherwise clear lower lobes.  Normal heart size.  No pericardial or pleural effusion.  No hiatal hernia.  Abdomen:  Liver, gallbladder, biliary system, and spleen are within normal limits for age and demonstrate no acute process. Nonspecific mild thickening of the adrenal glands without focal nodule or mass.  This can be seen with mild hyperplasia.  Kidneys demonstrate sub centimeter scattered hypodense cortical cysts bilaterally.  No hydronephrosis or obstruction.  Symmetric renal enhancement and excretion.  Pancreas demonstrates minimal peri pancreatic stranding edema most pronounced of the pancreatic head and uncinate process compatible with mild acute pancreatitis.  No fluid collection to suggest pseudocyst or abscess formation.  Mesenteric and splenic veins are all patent.  No ductal dilatation.  Negative for bowel obstruction, dilatation, ileus, or free air.  No abdominal free fluid, fluid collection, hemorrhage, or adenopathy.  Normal appendix demonstrated.  Aortic atherosclerosis without aneurysm.  Pelvis:  No pelvic free fluid, fluid collection, hemorrhage, abscess, adenopathy, inguinal abnormality, or  hernia.  Urinary bladder unremarkable.  No acute distal bowel process.  Degenerative changes of the lower thoracic and lumbar spine.  Facet arthropathy most pronounced at L5 S1.  No compression fracture or wedge-shaped deformity.  IMPRESSION: Mild acute pancreatitis without other complicating feature.   Original Report Authenticated By: Judie Petit. Shick, M.D.   1. Pancreatitis     MDM  Patient with abdominal pain. Has been treated for diverticulitis. Lipase is elevated CT scan shows only pancreatitis. He's not been tolerating orals well. Will admitted to the hospital. He does not drink and denies gallbladder problems. He wonders if it is his Januvia as the cause  [employer]. Rubin Payor, MD 04/16/13 1504

## 2013-04-17 LAB — CBC
Hemoglobin: 13 g/dL (ref 13.0–17.0)
MCH: 30.2 pg (ref 26.0–34.0)
MCV: 89.3 fL (ref 78.0–100.0)
RBC: 4.3 MIL/uL (ref 4.22–5.81)

## 2013-04-17 LAB — URINE CULTURE
Colony Count: NO GROWTH
Culture: NO GROWTH

## 2013-04-17 LAB — GLUCOSE, CAPILLARY
Glucose-Capillary: 247 mg/dL — ABNORMAL HIGH (ref 70–99)
Glucose-Capillary: 186 mg/dL — ABNORMAL HIGH (ref 70–99)
Glucose-Capillary: 237 mg/dL — ABNORMAL HIGH (ref 70–99)
Glucose-Capillary: 246 mg/dL — ABNORMAL HIGH (ref 70–99)

## 2013-04-17 LAB — BASIC METABOLIC PANEL
CO2: 28 mEq/L (ref 19–32)
Calcium: 8.8 mg/dL (ref 8.4–10.5)
Creatinine, Ser: 0.8 mg/dL (ref 0.50–1.35)
Glucose, Bld: 204 mg/dL — ABNORMAL HIGH (ref 70–99)

## 2013-04-17 MED ORDER — SIMETHICONE 80 MG PO CHEW
160.0000 mg | CHEWABLE_TABLET | Freq: Four times a day (QID) | ORAL | Status: DC | PRN
  Administered 2013-04-17: 160 mg via ORAL
  Filled 2013-04-17 (×2): qty 2

## 2013-04-17 MED ORDER — MORPHINE SULFATE 2 MG/ML IJ SOLN
1.0000 mg | INTRAMUSCULAR | Status: DC | PRN
  Administered 2013-04-17 – 2013-04-18 (×4): 1 mg via INTRAVENOUS
  Filled 2013-04-17 (×4): qty 1

## 2013-04-17 NOTE — Progress Notes (Signed)
INITIAL NUTRITION ASSESSMENT  DOCUMENTATION CODES Per approved criteria  -Obesity Unspecified   INTERVENTION: Diet advancement per MD discretion Provided "Pancreatitis Nutrition Therapy" handout from the Academy of [employer] and [employer]etics  NUTRITION DIAGNOSIS: Inadequate oral intake related to pancreatitis/pain as evidenced by 4% wt loss in 2 weeks per pt report.   Goal: Pt to meet >/= 90% of their estimated nutrition needs  Monitor:  Diet advancement/PO intake  Reason for Assessment: Malnutrition Screening Tool, score of 2  52 y.o. male  Admitting Dx: Acute Pancreatitis  ASSESSMENT:  53 y.o. male with past medical history of diabetes mellitus and hypertension who started having abdominal pain 2 weeks prior to admission. He relates his pain progressively got worse to the point where he can tolerate anything by mouth. Pt reports that he usually weighs 225 lbs but, he has not been eating much for the past 2 weeks causing him to lose weight. Pt reports that the last solid food he had was a few bites of dinner Sunday night. Pt drank broth and ate some jello at breakfast today and reports a tight feeling in abdomen but, no pain or nausea. Pt reports that he usually has a good appetite, eats snacks throughout the day, and follows a low salt, low sugar diet with small portions. Pt states he knows what to eat to control his diabetes but, he would like education regarding diet for pancreatitis. Gave pt "Pancreatitis Nutrition Therapy" handout from the Academy of [employer] and Dietetics. Encouraged pt to avoid high fat foods and alcohol; reviewed list of high fat foods for pt to avoid. Encouraged pt to incorporate all food groups daily. Emphasized that diet will help prevent pain with pancreatitis as well as help pt lose weight. Pt voiced understanding. Expect good compliance.   Height: Ht Readings from Last 1 Encounters:  04/16/13 5\' 7"  (1.702 m)    Weight: Wt Readings from Last 1  Encounters:  04/17/13 215 lb 6.4 oz (97.705 kg)    Ideal Body Weight: 148 lbs  % Ideal Body Weight: 145%  Wt Readings from Last 10 Encounters:  04/17/13 215 lb 6.4 oz (97.705 kg)  04/13/13 217 lb (98.431 kg)  04/10/13 215 lb (97.523 kg)  01/30/13 224 lb (101.606 kg)  01/30/13 224 lb (101.606 kg)  11/15/12 226 lb 8 oz (102.74 kg)  10/27/12 230 lb (104.327 kg)  08/05/12 234 lb (106.142 kg)  03/30/12 228 lb 3.2 oz (103.511 kg)  02/05/12 223 lb (101.152 kg)    Usual Body Weight: 225 lbs  % Usual Body Weight: 96%  BMI:  Body mass index is 33.73 kg/(m^2).  Estimated Nutritional Needs: Kcal: 2150-2350 Protein: 127-136 grams Fluid: 2.6 L  Skin: WDL  Diet Order: Full Liquid  EDUCATION NEEDS: -Education needs addressed   Intake/Output Summary (Last 24 hours) at 04/17/13 1448 Last data filed at 04/17/13 1431  Gross per 24 hour  Intake    480 ml  Output    240 ml  Net    240 ml    Last BM: 7/9  Labs:   Recent Labs Lab 04/13/13 1546 04/16/13 1242 04/16/13 1735 04/17/13 0515  NA 135 135  --  135  K 4.3 3.9  --  3.7  CL 97 100  --  99  CO2 28  --   --  28  BUN 15 19  --  10  CREATININE 1.17 1.00 0.95 0.80  CALCIUM 10.0  --   --  8.8  GLUCOSE 191* 277*  --  204*   Lab Results  Component Value Date   HGBA1C 8.4* 04/16/2013    CBG (last 3)   Recent Labs  04/17/13 0357 04/17/13 0804 04/17/13 1142  GLUCAP 186* 177* 237*    Scheduled Meds: . aspirin  81 mg Oral Daily  . atorvastatin  40 mg Oral QPM  . heparin  5,000 Units Subcutaneous Q8H  . insulin aspart  0-15 Units Subcutaneous Q4H  . insulin detemir  20 Units Subcutaneous QHS  . levothyroxine  112 mcg Oral QAC breakfast  . metoprolol succinate  100 mg Oral Daily    Continuous Infusions: . dextrose 5 % and 0.45% NaCl 20 mL/hr at 04/17/13 1008    Past Medical History  Diagnosis Date  . Diabetes mellitus without complication   . Hyperlipidemia   . Hypertension   . Hypogonadism male   .  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 Diverticulitis     Past Surgical History  Procedure Laterality Date  . Biopsy thyroid  02/06/2009    cold thyroid nodule; negative/benign.  . Colonoscopy w/ polypectomy  Feb 2014    5 polyps    Ian Malkin RD, LDN Inpatient Clinical Dietitian Pager: [phone] After Hours Pager: [phone]

## 2013-04-17 NOTE — Progress Notes (Signed)
[facility] PROGRESS NOTE   Assessment/Plan:  Acute pancreatitis: - pain imrpoved tolerating ice chips. - will d/c PCA, start clear liq diet if tolerate can be d/c home. - no stone, no ETOH, no TG. - unclear etiology.  Leukocytosis: - resolved. - afebrile.  Hypothyroid -cont synthroid  HTN (hypertension): - stable, follow up as an outpatient.  DM type 2 (diabetes mellitus, type 2) - he will like to avoid insulin at home. - resume home medication is tolerates orals.    Code Status: full  Family Communication: none  Disposition Plan: inpatient 2-3 days    Consultants:  none  Procedures:  none  Antibiotics:  None  HPI/Subjective: Feels better no pain. Will like to try orals.  Objective: Filed Vitals:   04/16/13 2200 04/17/13 0026 04/17/13 0600 04/17/13 0815  BP: 155/78  143/68   Pulse: 80  76   Temp: 97.7 F (36.5 C)  98.1 F (36.7 C)   TempSrc: Oral  Oral   Resp: 18 22 18    Height:      Weight:    97.705 kg (215 lb 6.4 oz)  SpO2: 100% 100% 100%     Intake/Output Summary (Last 24 hours) at 04/17/13 0825 Last data filed at 04/17/13 0815  Gross per 24 hour  Intake      0 ml  Output    240 ml  Net   -240 ml   Filed Weights   04/16/13 1146 04/16/13 1705 04/17/13 0815  Weight: 97.523 kg (215 lb) 97.2 kg (214 lb 4.6 oz) 97.705 kg (215 lb 6.4 oz)    Exam:  General: Alert, awake, oriented x3, in no acute distress.  HEENT: No bruits, no goiter.  Heart: Regular rate and rhythm, without murmurs, rubs, gallops.  Lungs: Good air movement, bilateral air movement.  Abdomen: Soft, nontender, nondistended, positive bowel sounds.  Neuro: Grossly intact, nonfocal.   Data Reviewed: Basic Metabolic Panel:  Recent Labs Lab 04/13/13 1546 04/16/13 1242 04/16/13 1735 04/17/13 0515  NA 135 135  --  135  K 4.3 3.9  --  3.7  CL 97 100  --  99  CO2 28  --   --  28  GLUCOSE 191* 277*  --  204*  BUN 15 19  --  10  CREATININE 1.17 1.00 0.95 0.80   CALCIUM 10.0  --   --  8.8   Liver Function Tests:  Recent Labs Lab 04/13/13 1546 04/16/13 1215 04/16/13 1735  AST 19 18 17   ALT 27 23 22   ALKPHOS 105 104 106  BILITOT 0.4 0.4 0.4  PROT 7.6 7.9 7.9  ALBUMIN 4.5 3.8 3.9    Recent Labs Lab 04/16/13 1215  LIPASE 1029*   No results found for this basename: AMMONIA,  in the last 168 hours CBC:  Recent Labs Lab 04/16/13 1215 04/16/13 1242 04/17/13 0515  WBC 14.0*  --  11.1*  NEUTROABS 11.3*  --   --   HGB 14.6 15.6 13.0  HCT 42.6 46.0 38.4*  MCV 89.1  --  89.3  PLT 331  --  286   Cardiac Enzymes: No results found for this basename: CKTOTAL, CKMB, CKMBINDEX, TROPONINI,  in the last 168 hours BNP (last 3 results) No results found for this basename: PROBNP,  in the last 8760 hours CBG:  Recent Labs Lab 04/16/13 1700 04/16/13 2023 04/17/13 0006 04/17/13 0357 04/17/13 0804  GLUCAP 213* 247* 223* 186* 177*    No results found for this or any  previous visit (from the past 240 hour(s)).   Studies: Ct Abdomen Pelvis W Contrast  04/16/2013   *RADIOLOGY REPORT*  Clinical Data: Abdominal pain, distention, constipation, elevated white count and lipase.  CT ABDOMEN AND PELVIS WITH CONTRAST  Technique:  Multidetector CT imaging of the abdomen and pelvis was performed following the standard protocol during bolus administration of intravenous contrast.  Contrast: 50mL OMNIPAQUE IOHEXOL 300 MG/ML  SOLN, OMNIPAQUE IOHEXOL 300 MG/ML  SOLN  Comparison: None.  Findings: Minimal dependent basilar atelectasis, otherwise clear lower lobes.  Normal heart size.  No pericardial or pleural effusion.  No hiatal hernia.  Abdomen:  Liver, gallbladder, biliary system, and spleen are within normal limits for age and demonstrate no acute process. Nonspecific mild thickening of the adrenal glands without focal nodule or mass.  This can be seen with mild hyperplasia.  Kidneys demonstrate sub centimeter scattered hypodense cortical cysts  bilaterally.  No hydronephrosis or obstruction.  Symmetric renal enhancement and excretion.  Pancreas demonstrates minimal peri pancreatic stranding edema most pronounced of the pancreatic head and uncinate process compatible with mild acute pancreatitis.  No fluid collection to suggest pseudocyst or abscess formation.  Mesenteric and splenic veins are all patent.  No ductal dilatation.  Negative for bowel obstruction, dilatation, ileus, or free air.  No abdominal free fluid, fluid collection, hemorrhage, or adenopathy.  Normal appendix demonstrated.  Aortic atherosclerosis without aneurysm.  Pelvis:  No pelvic free fluid, fluid collection, hemorrhage, abscess, adenopathy, inguinal abnormality, or hernia.  Urinary bladder unremarkable.  No acute distal bowel process.  Degenerative changes of the lower thoracic and lumbar spine.  Facet arthropathy most pronounced at L5 S1.  No compression fracture or wedge-shaped deformity.  IMPRESSION: Mild acute pancreatitis without other complicating feature.   Original Report Authenticated By: Judie Petit. Shick, M.D.    Scheduled Meds: . aspirin  81 mg Oral Daily  . atorvastatin  40 mg Oral QPM  . heparin  5,000 Units Subcutaneous Q8H  . insulin aspart  0-15 Units Subcutaneous Q4H  . insulin detemir  20 Units Subcutaneous QHS  . levothyroxine  112 mcg Oral QAC breakfast  . metoprolol succinate  100 mg Oral Daily  . morphine   Intravenous Q4H   Continuous Infusions: . dextrose 5 % and 0.45% NaCl 125 mL/hr at 04/16/13 1723     Marinda Elk  [facility] Pager [phone]. If 8PM-8AM, please contact night-coverage at [email or web address], password [facility] 04/17/2013, 8:25 AM  LOS: 1 day

## 2013-04-18 LAB — GLUCOSE, CAPILLARY: Glucose-Capillary: 181 mg/dL — ABNORMAL HIGH (ref 70–99)

## 2013-04-18 MED ORDER — SORBITOL 70 % SOLN
30.0000 mL | Freq: Once | Status: AC
  Administered 2013-04-18: 30 mL via ORAL
  Filled 2013-04-18: qty 30

## 2013-04-18 NOTE — Progress Notes (Signed)
Discharge instructions explained to pt and a copy of instructions were given to pt; pt verbalized understanding instructions.

## 2013-04-18 NOTE — Discharge Summary (Signed)
Physician Discharge Summary  ALVAH LAGROW ZOX:[id number] DOB: 22-Oct-1960 DOA: 04/16/2013  PCP: Norberto Sorenson, MD  Admit date: 04/16/2013 Discharge date: 04/18/2013  Time spent: 35 minutes  Recommendations for Outpatient Follow-up:  1. Follow up with PCP, HBGA1c high discuss with the patient the need for insulin. Reinforce diet and exercise. 2. Have rechallenge with Januvia and lisinopril which can cause pancreatitis.  Discharge Diagnoses:  Active Problems:   Acute pancreatitis   HTN (hypertension)   DM type 2 (diabetes mellitus, type 2)   Hypothyroid   Leukocytosis   Discharge Condition: stable  Diet recommendation: carb modified.  Filed Weights   04/17/13 0815 04/18/13 0500 04/18/13 0600  Weight: 97.705 kg (215 lb 6.4 oz) 97.5 kg (214 lb 15.2 oz) 97.5 kg (214 lb 15.2 oz)    History of present illness:  52 y.o. male  Past medical history of diabetes mellitus With an unknown hemoglobin A1c, hypertension started having abdominal pain 2 weeks prior to admission, when he saw his primary care doctor 1 week, at bedtime he was also having diarrhea as he was prescribed lactulose and he was treated empirically with Cipro and Flagyl. He relates his pain progressively got worse to the point where he can tolerate anything by mouth. He radiates to his back. Pain on his back or right side makes it worst, Tylenol has made better. Denies fever chills, weight loss. Melanotic stools or bright red blood per rectum. No hematemesis nausea or vomiting.   Hospital Course:  Acute pancreatitis:  - Started on narcotics, NPO and IV on admsion. - pain imrpoved and tolerating ice chips.  - d/c PCA, start clear liq diet, which he tolerate it. - no stone on CT or [place], no ETOH, <150 TG.  - unclear etiology. Unlikely medication as he has been on them for some time. Will rechallenge with Januvia and ACE. Follow up with PCP  Leukocytosis:  - resolved.  - afebrile.   Hypothyroid  -cont synthroid   HTN  (hypertension):  - stable, follow up as an outpatient.   DM type 2 (diabetes mellitus, type 2)  - he will like to avoid insulin at home.  - resume home medication is tolerates orals.   Procedures:  CT abd and pelvis 7.9.2014  abd [place] 7.10.2014  Consultations:  none  Discharge Exam: Filed Vitals:   04/17/13 1430 04/17/13 2058 04/18/13 0500 04/18/13 0600  BP: 183/87 171/83  129/81  Pulse: 73 72  84  Temp: 98.4 F (36.9 C) 99.6 F (37.6 C)  99.2 F (37.3 C)  TempSrc: Oral Oral  Oral  Resp: 18 18  20   Height:      Weight:   97.5 kg (214 lb 15.2 oz) 97.5 kg (214 lb 15.2 oz)  SpO2: 100% 97%  98%    General: A&O x3 Cardiovascular: RRR Respiratory: good air movement CTA B/L  Discharge Instructions  Discharge Orders   Future Orders Complete By Expires     Diet - low sodium heart healthy  As directed     Increase activity slowly  As directed         Medication List    STOP taking these medications       ciprofloxacin 500 MG tablet  Commonly known as:  CIPRO     metroNIDAZOLE 500 MG tablet  Commonly known as:  FLAGYL      TAKE these medications       acetaminophen 500 MG tablet  Commonly known as:  TYLENOL  Take 1,000-1,500  mg by mouth every 6 (six) hours as needed for pain.     aspirin 81 MG tablet  Take 1 tablet (81 mg total) by mouth daily.     atorvastatin 40 MG tablet  Commonly known as:  LIPITOR  Take 1 tablet (40 mg total) by mouth daily.     glimepiride 4 MG tablet  Commonly known as:  AMARYL  Take 1 tablet (4 mg total) by mouth daily before breakfast.     Lactulose 20 GM/30ML Soln  Take 30 mLs (20 g total) by mouth 2 (two) times daily.     levothyroxine 112 MCG tablet  Commonly known as:  SYNTHROID, LEVOTHROID  Take 1 tablet (112 mcg total) by mouth daily.     lisinopril-hydrochlorothiazide 20-25 MG per tablet  Commonly known as:  PRINZIDE,ZESTORETIC  Take 1 tablet by mouth daily.     metFORMIN 1000 MG tablet  Commonly known as:   GLUCOPHAGE  Take 1 tablet (1,000 mg total) by mouth 2 (two) times daily with a meal.     metoprolol succinate 100 MG 24 hr tablet  Commonly known as:  TOPROL-XL  Take 1 tablet (100 mg total) by mouth daily. Take with or immediately following a meal.     sitaGLIPtin 100 MG tablet  Commonly known as:  JANUVIA  Take 1 tablet (100 mg total) by mouth daily.       No Known Allergies     Follow-up Information   Follow up with SHAW,EVA, MD In 2 weeks. (diabetes follow up)    Contact information:   [address] [place] [address] [phone]        The results of significant diagnostics from this hospitalization (including imaging, microbiology, ancillary and laboratory) are listed below for reference.    Significant Diagnostic Studies: Ct Abdomen Pelvis W Contrast  04/16/2013   *RADIOLOGY REPORT*  Clinical Data: Abdominal pain, distention, constipation, elevated white count and lipase.  CT ABDOMEN AND PELVIS WITH CONTRAST  Technique:  Multidetector CT imaging of the abdomen and pelvis was performed following the standard protocol during bolus administration of intravenous contrast.  Contrast: 50mL OMNIPAQUE IOHEXOL 300 MG/ML  SOLN, OMNIPAQUE IOHEXOL 300 MG/ML  SOLN  Comparison: None.  Findings: Minimal dependent basilar atelectasis, otherwise clear lower lobes.  Normal heart size.  No pericardial or pleural effusion.  No hiatal hernia.  Abdomen:  Liver, gallbladder, biliary system, and spleen are within normal limits for age and demonstrate no acute process. Nonspecific mild thickening of the adrenal glands without focal nodule or mass.  This can be seen with mild hyperplasia.  Kidneys demonstrate sub centimeter scattered hypodense cortical cysts bilaterally.  No hydronephrosis or obstruction.  Symmetric renal enhancement and excretion.  Pancreas demonstrates minimal peri pancreatic stranding edema most pronounced of the pancreatic head and uncinate process compatible with mild  acute pancreatitis.  No fluid collection to suggest pseudocyst or abscess formation.  Mesenteric and splenic veins are all patent.  No ductal dilatation.  Negative for bowel obstruction, dilatation, ileus, or free air.  No abdominal free fluid, fluid collection, hemorrhage, or adenopathy.  Normal appendix demonstrated.  Aortic atherosclerosis without aneurysm.  Pelvis:  No pelvic free fluid, fluid collection, hemorrhage, abscess, adenopathy, inguinal abnormality, or hernia.  Urinary bladder unremarkable.  No acute distal bowel process.  Degenerative changes of the lower thoracic and lumbar spine.  Facet arthropathy most pronounced at L5 S1.  No compression fracture or wedge-shaped deformity.  IMPRESSION: Mild acute pancreatitis without other complicating  feature.   Original Report Authenticated By: Judie Petit. Miles Costain, M.D.   Dg Abd 2 Views  04/10/2013   *RADIOLOGY REPORT*  Clinical Data: Abdominal pain.  ABDOMEN - 2 VIEW  Comparison: None.  Findings: No free peritoneal air is identified.  There is no evidence of bowel obstruction.  Large stool burden ascending colon is noted.  No abnormal abdominal calcification.  IMPRESSION: No acute finding.  Large stool burden ascending colon.  Clinically significant discrepancy from primary report, if provided: None   Original Report Authenticated By: Holley Dexter, M.D.    Microbiology: Recent Results (from the past 240 hour(s))  URINE CULTURE     Status: None   Collection Time    04/16/13 12:23 PM      Result Value Range Status   Specimen Description URINE, CLEAN CATCH   Final   Special Requests NONE   Final   Culture  Setup Time 04/16/2013 18:44   Final   Colony Count NO GROWTH   Final   Culture NO GROWTH   Final   Report Status 04/17/2013 FINAL   Final     Labs: Basic Metabolic Panel:  Recent Labs Lab 04/13/13 1546 04/16/13 1242 04/16/13 1735 04/17/13 0515  NA 135 135  --  135  K 4.3 3.9  --  3.7  CL 97 100  --  99  CO2 28  --   --  28  GLUCOSE 191*  277*  --  204*  BUN 15 19  --  10  CREATININE 1.17 1.00 0.95 0.80  CALCIUM 10.0  --   --  8.8   Liver Function Tests:  Recent Labs Lab 04/13/13 1546 04/16/13 1215 04/16/13 1735  AST 19 18 17   ALT 27 23 22   ALKPHOS 105 104 106  BILITOT 0.4 0.4 0.4  PROT 7.6 7.9 7.9  ALBUMIN 4.5 3.8 3.9    Recent Labs Lab 04/16/13 1215  LIPASE 1029*   No results found for this basename: AMMONIA,  in the last 168 hours CBC:  Recent Labs Lab 04/16/13 1215 04/16/13 1242 04/17/13 0515  WBC 14.0*  --  11.1*  NEUTROABS 11.3*  --   --   HGB 14.6 15.6 13.0  HCT 42.6 46.0 38.4*  MCV 89.1  --  89.3  PLT 331  --  286   Cardiac Enzymes: No results found for this basename: CKTOTAL, CKMB, CKMBINDEX, TROPONINI,  in the last 168 hours BNP: BNP (last 3 results) No results found for this basename: PROBNP,  in the last 8760 hours CBG:  Recent Labs Lab 04/17/13 1142 04/17/13 1602 04/17/13 1939 04/18/13 0008 04/18/13 0407  GLUCAP 237* 188* 246* 190* 189*       Signed:  Marinda Elk  [facility] 04/18/2013, 7:49 AM

## 2013-04-23 ENCOUNTER — Ambulatory Visit (INDEPENDENT_AMBULATORY_CARE_PROVIDER_SITE_OTHER): Payer: Federal, State, Local not specified - PPO | Admitting: Family Medicine

## 2013-04-23 VITALS — BP 90/50 | HR 45 | Temp 98.0°F | Resp 16 | Ht 67.0 in | Wt 208.0 lb

## 2013-04-23 DIAGNOSIS — E785 Hyperlipidemia, unspecified: Secondary | ICD-10-CM

## 2013-04-23 DIAGNOSIS — K859 Acute pancreatitis without necrosis or infection, unspecified: Secondary | ICD-10-CM

## 2013-04-23 DIAGNOSIS — E119 Type 2 diabetes mellitus without complications: Secondary | ICD-10-CM

## 2013-04-23 DIAGNOSIS — E039 Hypothyroidism, unspecified: Secondary | ICD-10-CM

## 2013-04-23 DIAGNOSIS — I10 Essential (primary) hypertension: Secondary | ICD-10-CM

## 2013-04-23 LAB — POCT CBC
Hemoglobin: 13.2 g/dL — AB (ref 14.1–18.1)
Lymph, poc: 2.6 (ref 0.6–3.4)
MCH, POC: 30.5 pg (ref 27–31.2)
MCHC: 31.9 g/dL (ref 31.8–35.4)
MCV: 95.6 fL (ref 80–97)
MID (cbc): 0.7 (ref 0–0.9)
MPV: 9 fL (ref 0–99.8)
POC MID %: 6.7 %M (ref 0–12)
WBC: 11.1 10*3/uL — AB (ref 4.6–10.2)

## 2013-04-23 MED ORDER — LEVOTHYROXINE SODIUM 125 MCG PO TABS: 125.0000 ug | ORAL_TABLET | Freq: Every day | ORAL | Status: DC

## 2013-04-23 NOTE — Progress Notes (Signed)
Subjective:    Patient ID: Randy Ballard, male    DOB: 10-19-1960, 52 y.o.   MRN: [id number] Chief Complaint  Patient presents with  . Follow-up    high white count    HPI  Has a been hosp for pancreatitis recently.  Has been having liquid diarrhea but feels like he hasn't fully passed his bowels yet.  Had his first actual meal last night.  Has not been able to go back to work yet.  Has been able to sleep in his bed 2 nights ago for the first time.  Yesterday finally starting to pass gas.  Past Medical History  Diagnosis Date  . Diabetes mellitus without complication   . Hyperlipidemia   . Hypertension   . Hypogonadism male   . 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 Diverticulitis    Current Outpatient Prescriptions on File Prior to Visit  Medication Sig Dispense Refill  . acetaminophen (TYLENOL) 500 MG tablet Take 1,000-1,500 mg by mouth every 6 (six) hours as needed for pain.      Marland Kitchen aspirin 81 MG tablet Take 1 tablet (81 mg total) by mouth daily.  30 tablet  11   No current facility-administered medications on file prior to visit.   No Known Allergies   Review of Systems  Constitutional: Positive for appetite change and fatigue. Negative for fever, chills, diaphoresis and activity change.  Respiratory: Negative for shortness of breath.   Cardiovascular: Negative for chest pain.  Gastrointestinal: Positive for abdominal pain and diarrhea. Negative for nausea, vomiting, constipation, blood in stool, abdominal distention, anal bleeding and rectal pain.  Genitourinary: Negative for dysuria, frequency and decreased urine volume.  Skin: Negative for rash.  Hematological: Negative for adenopathy.      BP 90/50  Pulse 45  Temp(Src) 98 F (36.7 C) (Oral)  Resp 16  Ht 5\' 7"  (1.702 m)  Wt 208 lb (94.348 kg)  BMI 32.57 kg/m2  SpO2 98% Objective:   Physical Exam  Constitutional: He appears well-developed and well-nourished. No  distress.  HENT:  Head: Normocephalic and atraumatic.  Neck: Normal range of motion. Neck supple. No thyromegaly present.  Cardiovascular: Normal rate, regular rhythm and normal heart sounds.   Pulmonary/Chest: Effort normal and breath sounds normal.  Abdominal: Soft. Normal appearance and bowel sounds are normal. He exhibits no distension and no mass. There is tenderness in the right upper quadrant, epigastric area, periumbilical area and left upper quadrant. There is no rebound, no guarding and no CVA tenderness. No hernia.  Genitourinary: Rectum normal and prostate normal. Rectal exam shows no tenderness and anal tone normal. Guaiac negative stool.  Lymphadenopathy:    He has no cervical adenopathy.  Skin: He is not diaphoretic.      Results for orders placed in visit on 04/23/13  GLUCOSE, POCT (MANUAL RESULT ENTRY)      Result Value Range   POC Glucose 221 (*) 70 - 99 mg/dl  POCT CBC      Result Value Range   WBC 11.1 (*) 4.6 - 10.2 K/uL   Lymph, poc 2.6  0.6 - 3.4   POC LYMPH PERCENT 23.5  10 - 50 %L   MID (cbc) 0.7  0 - 0.9   POC MID % 6.7  0 - 12 %M   POC Granulocyte 7.7 (*) 2 - 6.9   Granulocyte percent 69.8  37 - 80 %G   RBC 4.33 (*) 4.69 -  6.13 M/uL   Hemoglobin 13.2 (*) 14.1 - 18.1 g/dL   HCT, POC 16.1 (*) 09.6 - 53.7 %   MCV 95.6  80 - 97 fL   MCH, POC 30.5  27 - 31.2 pg   MCHC 31.9  31.8 - 35.4 g/dL   RDW, POC 04.5     Platelet Count, POC 385  142 - 424 K/uL   MPV 9.0  0 - 99.8 fL    Assessment & Plan:  Unspecified hypothyroidism - Plan: levothyroxine (SYNTHROID, LEVOTHROID) 125 MCG tablet - followed by Dr. Sharl Ma yearly - next appt is in Dec but last sev tsh mildly elev so will increase dose from 112 to qd.  Pancreatitis, acute - Plan: POCT CBC, Comprehensive metabolic panel, Lipase - unknown etiology - distant h/o alcohol abuse but no prior h/o pancreatic problems. Is on januvia, lisinopril, lipitor all of which could be potential cuases.  I would like pt  to make an c/s appt w/ Dr. Sharl Ma to see if it is safe to continue him on Januvia or if there is a better medication for diabetes. 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  Resolving but pt not released to return to work today and wbc still mildly elev and pt still with mild abd pain.  Spent over half of visit discussing low fat diet to avoid further pancreatic diet.  DM type 2 (diabetes mellitus, type 2) - Plan: POCT glucose (manual entry) - will refer back to Dr. Sharl Ma. Pt cannot go on insulin due to DOT card but concern oral meds may have contributed to pancreatitis.  Reviewed low carb diet.  Dyslipidemia  HTN (hypertension)  Meds ordered this encounter  Medications  . levothyroxine (SYNTHROID, LEVOTHROID) 125 MCG tablet    Sig: Take 1 tablet (125 mcg total) by mouth daily.    Dispense:  90 tablet    Refill:  0

## 2013-04-23 NOTE — Patient Instructions (Signed)
We increased your thyroid medicine from 112 mcg daily to 125 mcg daily.  We should recheck your thyroid level in 6 wks (around Sept 1).  I recommend that follow-up with you your endocrinologist Dr. Sharl Ma as soon as possible. He may have some additional insights as to if your Randy Ballard was a cause of your pancreatitis and whether there may be a different diabetes medicine that would work better and/or be safer for you.

## 2013-04-24 LAB — COMPREHENSIVE METABOLIC PANEL
ALT: 49 U/L (ref 0–53)
BUN: 13 mg/dL (ref 6–23)
CO2: 26 mEq/L (ref 19–32)
Creat: 1.07 mg/dL (ref 0.50–1.35)
Glucose, Bld: 206 mg/dL — ABNORMAL HIGH (ref 70–99)
Total Bilirubin: 0.3 mg/dL (ref 0.3–1.2)

## 2013-04-24 LAB — LIPASE: Lipase: 169 U/L — ABNORMAL HIGH (ref 0–75)

## 2013-04-26 ENCOUNTER — Ambulatory Visit (INDEPENDENT_AMBULATORY_CARE_PROVIDER_SITE_OTHER): Payer: Federal, State, Local not specified - PPO | Admitting: Family Medicine

## 2013-04-26 VITALS — BP 128/82 | HR 78 | Temp 98.0°F | Resp 17 | Ht 68.0 in | Wt 214.0 lb

## 2013-04-26 DIAGNOSIS — E119 Type 2 diabetes mellitus without complications: Secondary | ICD-10-CM

## 2013-04-26 DIAGNOSIS — D72829 Elevated white blood cell count, unspecified: Secondary | ICD-10-CM

## 2013-04-26 DIAGNOSIS — E785 Hyperlipidemia, unspecified: Secondary | ICD-10-CM

## 2013-04-26 DIAGNOSIS — IMO0001 Reserved for inherently not codable concepts without codable children: Secondary | ICD-10-CM

## 2013-04-26 DIAGNOSIS — I10 Essential (primary) hypertension: Secondary | ICD-10-CM

## 2013-04-26 DIAGNOSIS — K859 Acute pancreatitis without necrosis or infection, unspecified: Secondary | ICD-10-CM

## 2013-04-26 LAB — POCT CBC
HCT, POC: 39.2 % — AB (ref 43.5–53.7)
Lymph, poc: 1.9 (ref 0.6–3.4)
MCH, POC: 30 pg (ref 27–31.2)
MCHC: 31.1 g/dL — AB (ref 31.8–35.4)
MCV: 96.4 fL (ref 80–97)
MID (cbc): 0.6 (ref 0–0.9)
POC LYMPH PERCENT: 19.9 %L (ref 10–50)
Platelet Count, POC: 368 10*3/uL (ref 142–424)
RDW, POC: 12.4 %
WBC: 9.3 10*3/uL (ref 4.6–10.2)

## 2013-04-26 MED ORDER — METOPROLOL SUCCINATE ER 100 MG PO TB24: 100.0000 mg | ORAL_TABLET | Freq: Every day | ORAL | Status: DC

## 2013-04-26 MED ORDER — LISINOPRIL-HYDROCHLOROTHIAZIDE 20-25 MG PO TABS: 1.0000 | ORAL_TABLET | Freq: Every day | ORAL | Status: DC

## 2013-04-26 MED ORDER — METFORMIN HCL 1000 MG PO TABS: 1000.0000 mg | ORAL_TABLET | Freq: Two times a day (BID) | ORAL | Status: DC

## 2013-04-26 MED ORDER — SITAGLIPTIN PHOSPHATE 100 MG PO TABS: 100.0000 mg | ORAL_TABLET | Freq: Every day | ORAL | Status: DC

## 2013-04-26 MED ORDER — GLIMEPIRIDE 4 MG PO TABS: 4.0000 mg | ORAL_TABLET | Freq: Every day | ORAL | Status: DC

## 2013-04-26 MED ORDER — ATORVASTATIN CALCIUM 40 MG PO TABS: 40.0000 mg | ORAL_TABLET | Freq: Every day | ORAL | Status: DC

## 2013-04-26 NOTE — Patient Instructions (Addendum)
Diabetes and Sick Day Management Blood sugar (glucose) can be more difficult to control when you are sick. Colds, fever, flu, nausea, vomiting, and diarrhea are all examples of common illnesses that can cause problems for people with diabetes. Loss of body fluids (dehydration) from fever, vomiting, diarrhea, infection, and the stress of a sickness can all cause blood glucose levels to rise. Because of this, it is very important to take your diabetes medicines and to eat some form of carbohydrate food when you are sick. Liquid or soft foods are often tolerated, and they help to replace fluids. HOME CARE INSTRUCTIONS These main guidelines are intended for managing a short-term (24 hours or less) sickness:  Take your usual dose of insulin or oral diabetes medicine. An exception would be if you take any form of metformin. If you cannot eat or drink, you can become dehydrated and should not take this medicine.  Continue to take your insulin even if you are unable to eat solid foods or are vomiting. Your insulin dose may stay the same, or it may need to be increased when you are sick.  You will need to test your blood glucose more often, generally every 2 4 hours. If you have type 1 diabetes, test your urine for ketones every 4 hours. If you have type 2 diabetes, test your urine for ketones as directed by your caregiver.  Eat some form of food that contains carbohydrates. The carbohydrates can be in solid or liquid form. You should eat 45 50 grams of carbohydrates every 3 4 hours.  Replace fluids if fever, vomiting, or diarrhea is present. Ask your caregiver for specific rehydration instructions.  Watch carefully for the signs of ketoacidosis if you have type 1 diabetes. Call your caregiver if any of the following symptoms are present, especially in children:  Moderate to large ketones in the urine along with a high blood glucose level.  Severe nausea.  Vomiting.  Diarrhea.  Abdominal  pain.  Rapid breathing.  Drink extra liquids that do not contain sugar, such as water or sugar-free liquids, if your blood glucose is higher than 240 mg/dl.  Be careful with over-the-counter medicines. Read the labels. They may contain sugar or types of sugars that can raise your blood glucose. Food Choices for Illness All of the food choices below contain around 15 grams of carbohydrates. Plan ahead and keep some of these foods around.    to  cup carbonated beverage containing sugar. Carbonated beverages will usually be better tolerated if they are opened and left at room temperature for a few minutes.   of a twin frozen ice pop.   cup sweetened gelatin dessert.   cup juice.   cup ice cream or frozen yogurt.   cup cooked cereal.   cup sherbet.  1 cup broth-based soup with noodles or rice, reconstituted with water.  1 cup cream soup.   cup regular custard.   cup regular pudding.  1 cup sports drink.  1 cup plain yogurt.  1 slice toast.  6 squares saltine crackers.  5 vanilla wafers. SEEK MEDICAL CARE IF:   You are unable to eat food or drink fluids for more than 6 hours.  You have nausea and vomiting for more than 6 hours.  Your blood glucose level is over 240 mg/dL.  There is a change in mental status.  You develop an additional serious illness.  You have diarrhea for more than 6 hours.  You have been sick or have had  a fever for a couple of days and are not getting better. SEEK IMMEDIATE MEDICAL CARE IF:  You have difficulty breathing.  You have moderate to large ketone levels. Document Released: 09/28/2003 Document Revised: 06/19/2012 Document Reviewed: 04/04/2011 [facility] [place] Patient Information 2014 [place], [place]. Diabetes Meal Planning Guide The diabetes meal planning guide is a tool to help you plan your meals and snacks. It is important for people with diabetes to manage their blood glucose (sugar) levels. Choosing the right foods and  the right amounts throughout your day will help control your blood glucose. Eating right can even help you improve your blood pressure and reach or maintain a healthy weight. CARBOHYDRATE COUNTING MADE EASY When you eat carbohydrates, they turn to sugar. This raises your blood glucose level. Counting carbohydrates can help you control this level so you feel better. When you plan your meals by counting carbohydrates, you can have more flexibility in what you eat and balance your medicine with your food intake. Carbohydrate counting simply means adding up the total amount of carbohydrate grams in your meals and snacks. Try to eat about the same amount at each meal. Foods with carbohydrates are listed below. Each portion below is 1 carbohydrate serving or 15 grams of carbohydrates. Ask your dietician how many grams of carbohydrates you should eat at each meal or snack. Grains and Starches  1 slice bread.   English muffin or hotdog/hamburger bun.   cup cold cereal (unsweetened).   cup cooked pasta or rice.   cup starchy vegetables (corn, potatoes, peas, beans, winter squash).  1 tortilla (6 inches).   bagel.  1 waffle or pancake (size of a CD).   cup cooked cereal.  4 to 6 small crackers. *Whole grain is recommended. Fruit  1 cup fresh unsweetened berries, melon, papaya, pineapple.  1 small fresh fruit.   banana or mango.   cup fruit juice (4 oz unsweetened).   cup canned fruit in natural juice or water.  2 tbs dried fruit.  12 to 15 grapes or cherries. Milk and Yogurt  1 cup fat-free or 1% milk.  1 cup soy milk.  6 oz light yogurt with sugar-free sweetener.  6 oz low-fat soy yogurt.  6 oz plain yogurt. Vegetables  1 cup raw or  cup cooked is counted as 0 carbohydrates or a "free" food.  If you eat 3 or more servings at 1 meal, count them as 1 carbohydrate serving. Other Carbohydrates   oz chips or pretzels.   cup ice cream or frozen yogurt.    cup sherbet or sorbet.  2 inch square cake, no frosting.  1 tbs honey, sugar, jam, jelly, or syrup.  2 small cookies.  3 squares of graham crackers.  3 cups popcorn.  6 crackers.  1 cup broth-based soup.  Count 1 cup casserole or other mixed foods as 2 carbohydrate servings.  Foods with less than 20 calories in a serving may be counted as 0 carbohydrates or a "free" food. You may want to purchase a book or computer software that lists the carbohydrate gram counts of different foods. In addition, the nutrition facts panel on the labels of the foods you eat are a good source of this information. The label will tell you how big the serving size is and the total number of carbohydrate grams you will be eating per serving. Divide this number by 15 to obtain the number of carbohydrate servings in a portion. Remember, 1 carbohydrate serving equals 15 grams of  carbohydrate. SERVING SIZES Measuring foods and serving sizes helps you make sure you are getting the right amount of food. The list below tells how big or small some common serving sizes are.  1 oz.........4 stacked dice.  3 oz........Marland KitchenDeck of cards.  1 tsp.......Marland KitchenTip of little finger.  1 tbs......Marland KitchenMarland KitchenThumb.  2 tbs.......Marland KitchenGolf ball.   cup......Marland KitchenHalf of a fist.  1 cup.......Marland KitchenA fist. SAMPLE DIABETES MEAL PLAN Below is a sample meal plan that includes foods from the grain and starches, dairy, vegetable, fruit, and meat groups. A dietician can individualize a meal plan to fit your calorie needs and tell you the number of servings needed from each food group. However, controlling the total amount of carbohydrates in your meal or snack is more important than making sure you include all of the food groups at every meal. You may interchange carbohydrate containing foods (dairy, starches, and fruits). The meal plan below is an example of a 2000 calorie diet using carbohydrate counting. This meal plan has 17 carbohydrate  servings. Breakfast  1 cup oatmeal (2 carb servings).   cup light yogurt (1 carb serving).  1 cup blueberries (1 carb serving).   cup almonds. Snack  1 large apple (2 carb servings).  1 low-fat string cheese stick. Lunch  Chicken breast salad.  1 cup spinach.   cup chopped tomatoes.  2 oz chicken breast, sliced.  2 tbs low-fat [place] dressing.  12 whole-wheat crackers (2 carb servings).  12 to 15 grapes (1 carb serving).  1 cup low-fat milk (1 carb serving). Snack  1 cup carrots.   cup hummus (1 carb serving). Dinner  3 oz broiled salmon.  1 cup brown rice (3 carb servings). Snack  1  cups steamed broccoli (1 carb serving) drizzled with 1 tsp olive oil and lemon juice.  1 cup light pudding (2 carb servings). DIABETES MEAL PLANNING WORKSHEET Your dietician can use this worksheet to help you decide how many servings of foods and what types of foods are right for you.  BREAKFAST Food Group and Servings / Carb Servings Grain/Starches __________________________________ Dairy __________________________________________ Vegetable ______________________________________ Fruit ___________________________________________ Meat __________________________________________ Fat ____________________________________________ LUNCH Food Group and Servings / Carb Servings Grain/Starches ___________________________________ Dairy ___________________________________________ Fruit ____________________________________________ Meat ___________________________________________ Fat _____________________________________________ [place] Food Group and Servings / Carb Servings Grain/Starches ___________________________________ Dairy ___________________________________________ Fruit ____________________________________________ Meat ___________________________________________ Fat _____________________________________________ SNACKS Food Group and Servings / Carb  Servings Grain/Starches ___________________________________ Dairy ___________________________________________ Vegetable _______________________________________ Fruit ____________________________________________ Meat ___________________________________________ Fat _____________________________________________ DAILY TOTALS Starches _________________________ Vegetable ________________________ Fruit ____________________________ Dairy ____________________________ Meat ____________________________ Fat ______________________________ Document Released: 06/22/2005 Document Revised: 12/18/2011 Document Reviewed: 05/03/2009 [employer] Patient Information 2014 [employer]. Fat and Cholesterol Control Diet Cholesterol levels in your body are determined significantly by your diet. Cholesterol levels may also be related to heart disease. The following material helps to explain this relationship and discusses what you can do to help keep your heart healthy. Not all cholesterol is bad. Low-density lipoprotein (LDL) cholesterol is the "bad" cholesterol. It may cause fatty deposits to build up inside your arteries. High-density lipoprotein (HDL) cholesterol is "good." It helps to remove the "bad" LDL cholesterol from your blood. Cholesterol is a very important risk factor for heart disease. Other risk factors are high blood pressure, smoking, stress, heredity, and weight. The heart muscle gets its supply of blood through the coronary arteries. If your LDL cholesterol is high and your HDL cholesterol is low, you are at risk for having fatty deposits build up in your coronary arteries. This  leaves less room through which blood can flow. Without sufficient blood and oxygen, the heart muscle cannot function properly and you may feel chest pains (angina pectoris). When a coronary artery closes up entirely, a part of the heart muscle may die causing a heart attack (myocardial infarction). CHECKING CHOLESTEROL When your  caregiver sends your blood to a lab to be examined for cholesterol, a complete lipid (fat) profile may be done. With this test, the total amount of cholesterol and levels of LDL and HDL are determined. Triglycerides are a type of fat that circulates in the blood. They can also be used to determine heart disease risk. The list below describes what the numbers should be: Test: Total Cholesterol.  Less than 200 mg/dl. Test: LDL "bad cholesterol."  Less than 100 mg/dl.  Less than 70 mg/dl if you are at very high risk of a heart attack or sudden cardiac death. Test: HDL "good cholesterol."  Greater than 50 mg/dl for women.  Greater than 40 mg/dl for men. Test: Triglycerides.  Less than 150 mg/dl. CONTROLLING CHOLESTEROL WITH DIET Although exercise and lifestyle factors are important, your diet is key. That is because certain foods are known to raise cholesterol and others to lower it. The goal is to balance foods for their effect on cholesterol and more importantly, to replace saturated and trans fat with other types of fat, such as monounsaturated fat, polyunsaturated fat, and omega-3 fatty acids. On average, a person should consume no more than 15 to 17 g of saturated fat daily. Saturated and trans fats are considered "bad" fats, and they will raise LDL cholesterol. Saturated fats are primarily found in animal products such as meats, butter, and cream. However, that does not mean you need to give up all your favorite foods. Today, there are good tasting, low-fat, low-cholesterol substitutes for most of the things you like to eat. Choose low-fat or nonfat alternatives. Choose round or loin cuts of red meat. These types of cuts are lowest in fat and cholesterol. Chicken (without the skin), fish, veal, and ground [place] breast are great choices. Eliminate fatty meats, such as hot dogs and salami. Even shellfish have little or no saturated fat. Have a 3 oz (85 g) portion when you eat lean meat, poultry,  or fish. Trans fats are also called "partially hydrogenated oils." They are oils that have been scientifically manipulated so that they are solid at room temperature resulting in a longer shelf life and improved taste and texture of foods in which they are added. Trans fats are found in stick margarine, some tub margarines, cookies, crackers, and baked goods.  When baking and cooking, oils are a great substitute for butter. The monounsaturated oils are especially beneficial since it is believed they lower LDL and raise HDL. The oils you should avoid entirely are saturated tropical oils, such as coconut and palm.  Remember to eat a lot from food groups that are naturally free of saturated and trans fat, including fish, fruit, vegetables, beans, grains (barley, rice, couscous, bulgur wheat), and pasta (without cream sauces).  IDENTIFYING FOODS THAT LOWER CHOLESTEROL  Soluble fiber may lower your cholesterol. This type of fiber is found in fruits such as apples, vegetables such as broccoli, potatoes, and carrots, legumes such as beans, peas, and lentils, and grains such as barley. Foods fortified with plant sterols (phytosterol) may also lower cholesterol. You should eat at least 2 g per day of these foods for a cholesterol lowering effect.  Read package labels to  identify low-saturated fats, trans fat free, and low-fat foods at the supermarket. Select cheeses that have only 2 to 3 g saturated fat per ounce. Use a heart-healthy tub margarine that is free of trans fats or partially hydrogenated oil. When buying baked goods (cookies, crackers), avoid partially hydrogenated oils. Breads and muffins should be made from whole grains (whole-wheat or whole oat flour, instead of "flour" or "enriched flour"). Buy non-creamy canned soups with reduced salt and no added fats.  FOOD PREPARATION TECHNIQUES  Never deep-fry. If you must fry, either stir-fry, which uses very little fat, or use non-stick cooking sprays. When  possible, broil, bake, or roast meats, and steam vegetables. Instead of putting butter or margarine on vegetables, use lemon and herbs, applesauce, and cinnamon (for squash and sweet potatoes), nonfat yogurt, salsa, and low-fat dressings for salads.  LOW-SATURATED FAT / LOW-FAT FOOD SUBSTITUTES Meats / Saturated Fat (g)  Avoid: Steak, marbled (3 oz/85 g) / 11 g  Choose: Steak, lean (3 oz/85 g) / 4 g  Avoid: Hamburger (3 oz/85 g) / 7 g  Choose: Hamburger, lean (3 oz/85 g) / 5 g  Avoid: Ham (3 oz/85 g) / 6 g  Choose: Ham, lean cut (3 oz/85 g) / 2.4 g  Avoid: Chicken, with skin, dark meat (3 oz/85 g) / 4 g  Choose: Chicken, skin removed, dark meat (3 oz/85 g) / 2 g  Avoid: Chicken, with skin, light meat (3 oz/85 g) / 2.5 g  Choose: Chicken, skin removed, light meat (3 oz/85 g) / 1 g Dairy / Saturated Fat (g)  Avoid: Whole milk (1 cup) / 5 g  Choose: Low-fat milk, 2% (1 cup) / 3 g  Choose: Low-fat milk, 1% (1 cup) / 1.5 g  Choose: Skim milk (1 cup) / 0.3 g  Avoid: Hard cheese (1 oz/28 g) / 6 g  Choose: Skim milk cheese (1 oz/28 g) / 2 to 3 g  Avoid: Cottage cheese, 4% fat (1 cup) / 6.5 g  Choose: Low-fat cottage cheese, 1% fat (1 cup) / 1.5 g  Avoid: Ice cream (1 cup) / 9 g  Choose: Sherbet (1 cup) / 2.5 g  Choose: Nonfat frozen yogurt (1 cup) / 0.3 g  Choose: Frozen fruit bar / trace  Avoid: Whipped cream (1 tbs) / 3.5 g  Choose: Nondairy whipped topping (1 tbs) / 1 g Condiments / Saturated Fat (g)  Avoid: Mayonnaise (1 tbs) / 2 g  Choose: Low-fat mayonnaise (1 tbs) / 1 g  Avoid: Butter (1 tbs) / 7 g  Choose: Extra light margarine (1 tbs) / 1 g  Avoid: Coconut oil (1 tbs) / 11.8 g  Choose: Olive oil (1 tbs) / 1.8 g  Choose: Corn oil (1 tbs) / 1.7 g  Choose: Safflower oil (1 tbs) / 1.2 g  Choose: Sunflower oil (1 tbs) / 1.4 g  Choose: Soybean oil (1 tbs) / 2.4 g  Choose: Canola oil (1 tbs) / 1 g Document Released: 09/25/2005 Document Revised:  12/18/2011 Document Reviewed: 03/16/2011 [employer] Patient Information 2014 [place], [place].

## 2013-04-26 NOTE — Progress Notes (Signed)
Subjective:    Patient ID: Randy Ballard, male    DOB: 09-Mar-1961, 52 y.o.   MRN: [id number] Chief Complaint  Patient presents with  . Follow-up    labs     HPI  Pt is feeling much better. Still occ pain but much less. Has been to sleep w/o pain medicines. CBG was 167 fasting but fluctuates.  Past Medical History  Diagnosis Date  . Diabetes mellitus without complication   . Hyperlipidemia   . Hypertension   . Hypogonadism male   . 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 Diverticulitis    Current Outpatient Prescriptions on File Prior to Visit  Medication Sig Dispense Refill  . acetaminophen (TYLENOL) 500 MG tablet Take 1,000-1,500 mg by mouth every 6 (six) hours as needed for pain.      Marland Kitchen aspirin 81 MG tablet Take 1 tablet (81 mg total) by mouth daily.  30 tablet  11  . levothyroxine (SYNTHROID, LEVOTHROID) 125 MCG tablet Take 1 tablet (125 mcg total) by mouth daily.  90 tablet  0   No current facility-administered medications on file prior to visit.   No Known Allergies   Review of Systems  Constitutional: Negative for fever, chills, diaphoresis, activity change, appetite change, fatigue and unexpected weight change.  Gastrointestinal: Positive for abdominal pain. Negative for nausea, vomiting, diarrhea and constipation.  Genitourinary: Negative for urgency and decreased urine volume.  Skin: Negative for rash.  Psychiatric/Behavioral: Negative for sleep disturbance and dysphoric mood. The patient is not nervous/anxious.       BP 128/82  Pulse 78  Temp(Src) 98 F (36.7 C) (Oral)  Resp 17  Ht 5\' 8"  (1.727 m)  Wt 214 lb (97.07 kg)  BMI 32.55 kg/m2  SpO2 98% Objective:   Physical Exam  Constitutional: He is oriented to person, place, and time. He appears well-developed and well-nourished. No distress.  HENT:  Head: Normocephalic and atraumatic.  Eyes: Conjunctivae are normal. Pupils are equal, round, and reactive to light.  No scleral icterus.  Neck: Normal range of motion. Neck supple. No thyromegaly present.  Cardiovascular: Normal rate, regular rhythm, normal heart sounds and intact distal pulses.   Pulmonary/Chest: Effort normal and breath sounds normal. No respiratory distress.  Abdominal: Soft. Normal appearance and bowel sounds are normal. There is no hepatosplenomegaly. There is tenderness in the epigastric area. There is no CVA tenderness.  Musculoskeletal: He exhibits no edema.  Lymphadenopathy:    He has no cervical adenopathy.  Neurological: He is alert and oriented to person, place, and time.  Skin: Skin is warm and dry. He is not diaphoretic.  Psychiatric: He has a normal mood and affect. His behavior is normal.      Results for orders placed in visit on 04/26/13  POCT CBC      Result Value Range   WBC 9.3  4.6 - 10.2 K/uL   Lymph, poc 1.9  0.6 - 3.4   POC LYMPH PERCENT 19.9  10 - 50 %L   MID (cbc) 0.6  0 - 0.9   POC MID % 6.1  0 - 12 %M   POC Granulocyte 6.9  2 - 6.9   Granulocyte percent 74.0  37 - 80 %G   RBC 4.07 (*) 4.69 - 6.13 M/uL   Hemoglobin 12.2 (*) 14.1 - 18.1 g/dL   HCT, POC 78.2 (*) 95.6 - 53.7 %   MCV 96.4  80 - 97 fL  MCH, POC 30.0  27 - 31.2 pg   MCHC 31.1 (*) 31.8 - 35.4 g/dL   RDW, POC 44.0     Platelet Count, POC 368  142 - 424 K/uL   MPV 9.0  0 - 99.8 fL  GLUCOSE, POCT (MANUAL RESULT ENTRY)      Result Value Range   POC Glucose 232 (*) 70 - 99 mg/dl    Assessment & Plan:  Acute pancreatitis - Plan: Lipase, Ambulatory referral to Endocrinology, CANCELED: Ambulatory referral to Endocrinology - Elev wbc and sxs resolved.  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  Note given for release.  Leukocytosis - Plan: POCT CBC - resolved.  Other and unspecified hyperlipidemia - Plan: atorvastatin (LIPITOR) 40 MG tablet  Type II or unspecified type diabetes mellitus without mention of complication, not stated as uncontrolled - Plan: glimepiride (AMARYL) 4 MG tablet,  lisinopril-hydrochlorothiazide (PRINZIDE,ZESTORETIC) 20-25 MG per tablet, metFORMIN (GLUCOPHAGE) 1000 MG tablet, sitaGLIPtin (JANUVIA) 100 MG tablet - POCT glucose (manual entry), Ambulatory referral to Endocrinology - Pt has not yet sched appt with Dr. Sharl Ma for c/s - cannot use insulin due to DOT card but concern that [place] contributed to pancreatitis. DM not ideally controlled but maxed out on current DM meds so will ask Dr. Sharl Ma to advise on additional meds - hopefully oral.  Unspecified essential hypertension - Plan: lisinopril-hydrochlorothiazide (PRINZIDE,ZESTORETIC) 20-25 MG per tablet, metoprolol succinate (TOPROL-XL) 100 MG 24 hr tablet  Hypothyroid - prev followed by Dr. Sharl Ma - levothyroxine increased at last visit so recheck tsh in 6-8 wks.  Meds ordered this encounter  Medications  . atorvastatin (LIPITOR) 40 MG tablet    Sig: Take 1 tablet (40 mg total) by mouth daily.    Dispense:  30 tablet    Refill:  5  . glimepiride (AMARYL) 4 MG tablet    Sig: Take 1 tablet (4 mg total) by mouth daily before breakfast.    Dispense:  30 tablet    Refill:  5  . lisinopril-hydrochlorothiazide (PRINZIDE,ZESTORETIC) 20-25 MG per tablet    Sig: Take 1 tablet by mouth daily.    Dispense:  30 tablet    Refill:  5  . metFORMIN (GLUCOPHAGE) 1000 MG tablet    Sig: Take 1 tablet (1,000 mg total) by mouth 2 (two) times daily with a meal.    Dispense:  60 tablet    Refill:  5  . metoprolol succinate (TOPROL-XL) 100 MG 24 hr tablet    Sig: Take 1 tablet (100 mg total) by mouth daily. Take with or immediately following a meal.    Dispense:  30 tablet    Refill:  5  . sitaGLIPtin (JANUVIA) 100 MG tablet    Sig: Take 1 tablet (100 mg total) by mouth daily.    Dispense:  30 tablet    Refill:  5

## 2013-04-28 DIAGNOSIS — Z0271 Encounter for disability determination: Secondary | ICD-10-CM

## 2013-05-02 ENCOUNTER — Telehealth: Payer: Self-pay

## 2013-05-02 NOTE — Telephone Encounter (Signed)
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

## 2013-05-08 NOTE — Telephone Encounter (Signed)
Forms completed, notified patient for pickup.

## 2013-05-19 ENCOUNTER — Encounter: Payer: Self-pay | Admitting: Family Medicine

## 2013-07-11 ENCOUNTER — Other Ambulatory Visit: Payer: Self-pay | Admitting: Family Medicine

## 2013-07-11 DIAGNOSIS — Z202 Contact with and (suspected) exposure to infections with a predominantly sexual mode of transmission: Secondary | ICD-10-CM

## 2013-07-11 MED ORDER — METRONIDAZOLE 500 MG PO TABS: 500.0000 mg | ORAL_TABLET | Freq: Two times a day (BID) | ORAL | Status: DC

## 2013-07-19 ENCOUNTER — Ambulatory Visit (INDEPENDENT_AMBULATORY_CARE_PROVIDER_SITE_OTHER): Payer: Federal, State, Local not specified - PPO | Admitting: Family Medicine

## 2013-07-19 VITALS — BP 138/70 | HR 72 | Temp 98.6°F | Resp 18 | Ht 66.75 in | Wt 215.4 lb

## 2013-07-19 DIAGNOSIS — E785 Hyperlipidemia, unspecified: Secondary | ICD-10-CM

## 2013-07-19 DIAGNOSIS — I10 Essential (primary) hypertension: Secondary | ICD-10-CM

## 2013-07-19 DIAGNOSIS — E039 Hypothyroidism, unspecified: Secondary | ICD-10-CM

## 2013-07-19 DIAGNOSIS — E119 Type 2 diabetes mellitus without complications: Secondary | ICD-10-CM

## 2013-07-19 DIAGNOSIS — Z0289 Encounter for other administrative examinations: Secondary | ICD-10-CM

## 2013-07-19 DIAGNOSIS — IMO0001 Reserved for inherently not codable concepts without codable children: Secondary | ICD-10-CM

## 2013-07-19 LAB — COMPREHENSIVE METABOLIC PANEL
AST: 40 U/L — ABNORMAL HIGH (ref 0–37)
Albumin: 4.4 g/dL (ref 3.5–5.2)
BUN: 14 mg/dL (ref 6–23)
CO2: 26 mEq/L (ref 19–32)
Calcium: 9.6 mg/dL (ref 8.4–10.5)
Chloride: 102 mEq/L (ref 96–112)
Potassium: 4.4 mEq/L (ref 3.5–5.3)

## 2013-07-19 LAB — POCT CBC
Granulocyte percent: 68.9 %G (ref 37–80)
HCT, POC: 42.5 % — AB (ref 43.5–53.7)
Hemoglobin: 13.7 g/dL — AB (ref 14.1–18.1)
Lymph, poc: 1.9 (ref 0.6–3.4)
MCH, POC: 31.6 pg — AB (ref 27–31.2)
MCHC: 32.2 g/dL (ref 31.8–35.4)
MCV: 98.2 fL — AB (ref 80–97)
MID (cbc): 0.6 (ref 0–0.9)
MPV: 9.1 fL (ref 0–99.8)
POC Granulocyte: 5.6 (ref 2–6.9)
POC LYMPH PERCENT: 23.8 %L (ref 10–50)
POC MID %: 7.3 %M (ref 0–12)
Platelet Count, POC: 269 10*3/uL (ref 142–424)
RBC: 4.33 M/uL — AB (ref 4.69–6.13)
RDW, POC: 13.1 %
WBC: 8.1 10*3/uL (ref 4.6–10.2)

## 2013-07-19 LAB — POCT GLYCOSYLATED HEMOGLOBIN (HGB A1C): Hemoglobin A1C: 5.6

## 2013-07-19 LAB — LIPASE: Lipase: 29 U/L (ref 0–75)

## 2013-07-19 NOTE — Progress Notes (Addendum)
This chart was scribed for Norberto Sorenson, MD, by Yevette Edwards, ED Scribe. The patient's care was started at 12:09 PM.  Subjective:    Patient ID: Randy Ballard, male    DOB: 1961/04/06, 52 y.o.   MRN: [id number]  Chief Complaint  Patient presents with   Annual Exam    DOT   blood work    HPI  HPI Comments: Randy Ballard is a 52 y.o. male, with a h/o DM and HTN, who presents to the [facility] desiring blood work performed. The pt has not had any food prior to the visit; he did use an artificial sweetener last night.   He states that his CBGs are running about 145; his CBGs after hospitalization were in the 180's. He checks them approximately three days a week, and he usually checks the CBGs prior to eating breakfast. He does not record the numbers, though he will try to in the future.   He was hospitalized three months ago, in July 2014, for pancreatitis. The pt has returned to work since the episode of pancreatitis; he returned to work at the end of July. He reports having an improved diet since the pancreatitis, for example, he states that he now eats very few hamburgers or fast food.   He states that he has not had a follow-up appointment concerning his mother's condition. He states that he has had balance issues associated with the maternal condition. He reports feeling unbalanced when standing up, walking up steps, or carrying heavy loads. He reports that intermittently he leans to one side even when standing still. The pt states that the balance issues are complicating his ability to work.   He also reports decreased strength to his hands and arms.   The pt has investigated having a podiatrist cut his toenails. He had an ingrown toe nail and a h/o fungus to his toes.   He has not followed up with an appointment with Dr. Debe Coder.   The pt is a daily smoker.  Current Outpatient Prescriptions on File Prior to Visit  Medication Sig Dispense Refill   aspirin 81 MG tablet Take 1 tablet (81  mg total) by mouth daily.  30 tablet  11   atorvastatin (LIPITOR) 40 MG tablet Take 1 tablet (40 mg total) by mouth daily.  30 tablet  5   glimepiride (AMARYL) 4 MG tablet Take 1 tablet (4 mg total) by mouth daily before breakfast.  30 tablet  5   levothyroxine (SYNTHROID, LEVOTHROID) 125 MCG tablet Take 1 tablet (125 mcg total) by mouth daily.  90 tablet  0   lisinopril-hydrochlorothiazide (PRINZIDE,ZESTORETIC) 20-25 MG per tablet Take 1 tablet by mouth daily.  30 tablet  5   metFORMIN (GLUCOPHAGE) 1000 MG tablet Take 1 tablet (1,000 mg total) by mouth 2 (two) times daily with a meal.  60 tablet  5   metoprolol succinate (TOPROL-XL) 100 MG 24 hr tablet Take 1 tablet (100 mg total) by mouth daily. Take with or immediately following a meal.  30 tablet  5   acetaminophen (TYLENOL) 500 MG tablet Take 1,000-1,500 mg by mouth every 6 (six) hours as needed for pain.       metroNIDAZOLE (FLAGYL) 500 MG tablet Take 1 tablet (500 mg total) by mouth 2 (two) times daily.  14 tablet  0   sitaGLIPtin (JANUVIA) 100 MG tablet Take 1 tablet (100 mg total) by mouth daily.  30 tablet  5   No current facility-administered medications on file  prior to visit.   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  Eyes: Negative for visual disturbance.  Respiratory: Negative for shortness of breath.   Cardiovascular: Negative for chest pain and leg swelling.  Skin:       Fungus to toes.  Neurological: Positive for numbness. Negative for dizziness, syncope, facial asymmetry, weakness, light-headedness and headaches.       Unbalanced.      BP 138/70   Pulse 72   Temp(Src) 98.6 F (37 C) (Oral)   Resp 18   Ht 5' 6.75" (1.695  m)   Wt 215 lb 6.4 oz (97.705 kg)   BMI 34.01 kg/m2   SpO2 99% Objective:   Physical Exam  Nursing note and vitals reviewed. Constitutional: He is oriented to person, place, and time. He appears well-developed and well-nourished. No distress.  HENT:  Head: Normocephalic and atraumatic.  Right Ear: External ear normal.  Left Ear: External ear normal.  Nose: Nose normal.  Mouth/Throat: Oropharynx is clear and moist. No oropharyngeal exudate.  Eyes: EOM are normal.  Neck: Normal range of motion. Neck supple. No tracheal deviation present.  Cardiovascular: Normal rate, regular rhythm, normal heart sounds and intact distal pulses.   No murmur heard. Pulmonary/Chest: Effort normal and breath sounds normal. No respiratory distress. He has no wheezes. He has no rales.  Abdominal: Soft. Bowel sounds are normal. He exhibits no distension and no mass. There is no tenderness. There is no rebound and no guarding.  Musculoskeletal: Normal range of motion. He exhibits no tenderness.  Neurological: He is alert and oriented to person, place, and time.  Decreased sensation to light touch in bilateral feet.   Skin: Skin is warm and dry. No erythema.  Psychiatric: He has a normal mood and affect. His behavior is normal.      Results for orders placed in visit on 07/19/13  POCT GLYCOSYLATED HEMOGLOBIN (HGB A1C)      Result Value Range   Hemoglobin A1C 5.6    POCT CBC      Result Value Range   WBC 8.1  4.6 - 10.2 K/uL   Lymph, poc 1.9  0.6 - 3.4   POC LYMPH PERCENT 23.8  10 - 50 %L   MID (cbc) 0.6  0 - 0.9   POC MID % 7.3  0 - 12 %M   POC Granulocyte 5.6  2 - 6.9   Granulocyte percent 68.9  37 - 80 %G   RBC 4.33 (*) 4.69 - 6.13 M/uL   Hemoglobin 13.7 (*) 14.1 - 18.1 g/dL   HCT, POC 16.1 (*) 09.6 - 53.7 %   MCV 98.2 (*) 80 - 97 fL   MCH, POC 31.6 (*) 27 - 31.2 pg   MCHC 32.2  31.8 - 35.4 g/dL   RDW, POC 04.5     Platelet Count, POC 269  142 - 424 K/uL   MPV 9.1  0 - 99.8 fL   Assessment &  Plan:  Hypothyroid - Plan: POCT CBC, TSH  HTN (hypertension) - Plan: Lipase, Comprehensive metabolic panel  Dyslipidemia - Plan: Lipase  DM type 2 (diabetes mellitus, type 2) - Plan: POCT glycosylated  hemoglobin (Hb A1C) - Doing REMARKABLY well. Pt's a1c is sig improved since stopping [place]. - started an otc herbal supplement to help with glucose control so he will bring this to f/u to see what this is.  No change in medications. Recheck in 3-4 mos.

## 2013-07-19 NOTE — Patient Instructions (Signed)
Remember to make an appointment with Dr. Sharl Ma to address your diabetes.  I am concerned that you diabetes is getting worse and that if we don't get you on better or different medications now, you will end up needing to be on insulin.  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 Your blood pressure is borderline. Come back to see me in 3 mos and we will recheck it. If it is not better, we will need to add in another medication.

## 2013-07-19 NOTE — Progress Notes (Signed)
Commercial Driver Medical Examination   Randy Ballard is a 52 y.o. male who presents today for a commercial driver fitness determination physical exam. The patient reports no problems. The following portions of the patient's history were reviewed and updated as appropriate: allergies, current medications, past family history, past medical history, past social history, past surgical history and problem list. Review of Systems Pertinent items are noted in HPI.   Objective:    Vision:  Uncorrected Corrected Horizontal Field of Vision  Right Eye na 20/20 85 degrees  Left Eye  na 20/40 85 degrees  Both Eyes  na 20/20    Applicant can recognize and distinguish among traffic control signals and devices showing standard red, green, and amber colors.  Applicant meets visual acuity requirement only when wearing corrective lenses.  Monocular Vision?: No   Hearing:   500 Hz 1000 Hz 2000 Hz 4000 Hz  Right Ear  na na na na  Left Ear  na na na na      can hear whispered voice from ten feet bilaterally. BP 138/70.    BP 138/70  Pulse 72  Temp(Src) 98.6 F (37 C) (Oral)  Resp 18  Ht 5' 6.75" (1.695 m)  Wt 215 lb 6.4 oz (97.705 kg)  BMI 34.01 kg/m2  SpO2 99%  General Appearance:    Alert, cooperative, no distress, appears stated age  Head:    Normocephalic, without obvious abnormality, atraumatic  Eyes:    PERRL, conjunctiva/corneas clear, EOM's intact, fundi    benign, both eyes       Ears:    Normal TM's and external ear canals, both ears  Nose:   Nares normal, septum midline, mucosa normal, no drainage    or sinus tenderness  Throat:   Lips, mucosa, and tongue normal; teeth and gums normal  Neck:   Supple, symmetrical, trachea midline, no adenopathy;       thyroid:  No enlargement/tenderness/nodules; no carotid   bruit or JVD  Back:     Symmetric, no curvature, ROM normal, no CVA tenderness  Lungs:     Clear to auscultation bilaterally, respirations unlabored  Chest wall:     No tenderness or deformity  Heart:    Regular rate and rhythm, S1 and S2 normal, no murmur, rub   or gallop  Abdomen:     Soft, non-tender, bowel sounds active all four quadrants,    no masses, no organomegaly  Genitalia:    Normal male without lesion, discharge or tenderness     Extremities:   Extremities normal, atraumatic, no cyanosis or edema  Pulses:   2+ and symmetric all extremities  Skin:   Skin color, texture, turgor normal, no rashes or lesions  Lymph nodes:   Cervical, supraclavicular, and axillary nodes normal  Neurologic:   CNII-XII intact. Normal strength, sensation and reflexes      throughout   Labs: Lab Results  Component Value Date   SPECGRAV 1.025 10/27/2012   PROTEINUR 30* 04/16/2013   BILIRUBINUR SMALL* 04/16/2013   GLUCOSEU >1000* 04/16/2013     Assessment:    Healthy male exam.  Meets standards, but periodic monitoring required due to DM and HTN.  Driver qualified only for 1 year.    Plan:    Medical examiners certificate completed and printed. Return as needed.

## 2013-07-22 ENCOUNTER — Encounter: Payer: Self-pay | Admitting: Family Medicine

## 2013-07-22 MED ORDER — METOPROLOL SUCCINATE ER 100 MG PO TB24: 100.0000 mg | ORAL_TABLET | Freq: Every day | ORAL | Status: DC

## 2013-07-22 MED ORDER — LEVOTHYROXINE SODIUM 125 MCG PO TABS: 125.0000 ug | ORAL_TABLET | Freq: Every day | ORAL | Status: DC

## 2013-07-22 MED ORDER — GLIMEPIRIDE 4 MG PO TABS: 4.0000 mg | ORAL_TABLET | Freq: Every day | ORAL | Status: DC

## 2013-07-22 MED ORDER — METFORMIN HCL 1000 MG PO TABS: 1000.0000 mg | ORAL_TABLET | Freq: Two times a day (BID) | ORAL | Status: DC

## 2013-07-22 MED ORDER — LISINOPRIL-HYDROCHLOROTHIAZIDE 20-25 MG PO TABS: 1.0000 | ORAL_TABLET | Freq: Every day | ORAL | Status: DC

## 2013-07-22 MED ORDER — ATORVASTATIN CALCIUM 40 MG PO TABS: 40.0000 mg | ORAL_TABLET | Freq: Every day | ORAL | Status: DC

## 2013-09-05 ENCOUNTER — Telehealth: Payer: Self-pay

## 2013-09-05 NOTE — Telephone Encounter (Signed)
Pt needs to talk with someone about blood work that dr Clelia Croft has ordered for him and he was supposed to take the order to [facility] and they ware saying that need it faxed from the sys to [phone] for patient to have this done

## 2013-09-08 NOTE — Telephone Encounter (Signed)
Called and spoke to patient concerning blood work that he was supposed to have done at [facility]. States that lab is needing new order for the SCA3 test ?

## 2013-09-09 NOTE — Telephone Encounter (Signed)
Left message on machine form was faxed.

## 2013-09-09 NOTE — Telephone Encounter (Signed)
Pt's mother with genetic disease spinocerebellar ataxia type 3 - Machado-Joseph disease - and unfortunately pt has started to develop some of the sxs in himself (e.g. gait d/o and trouble w/ proprioception). In Feb pt was given hand written rx for genetic testing at [facility] lab for sca3 alleles.  Ramona had called [facility] to figure out exactly what to order - they will have to send off to speciality lab.  Ok to rewrite rx and fax over if they need a new order - diagnosis gait disorder. I don't think there was a way to do this in Epic.

## 2013-09-19 ENCOUNTER — Other Ambulatory Visit: Payer: Self-pay | Admitting: Internal Medicine

## 2013-09-19 DIAGNOSIS — E041 Nontoxic single thyroid nodule: Secondary | ICD-10-CM

## 2013-09-26 ENCOUNTER — Ambulatory Visit
Admission: RE | Admit: 2013-09-26 | Discharge: 2013-09-26 | Disposition: A | Discharge status: Home / Self Care | Payer: Federal, State, Local not specified - PPO | Source: Ambulatory Visit | Attending: Internal Medicine | Admitting: Internal Medicine

## 2013-09-26 DIAGNOSIS — E041 Nontoxic single thyroid nodule: Secondary | ICD-10-CM

## 2013-10-03 DIAGNOSIS — E039 Hypothyroidism, unspecified: Secondary | ICD-10-CM

## 2013-10-08 ENCOUNTER — Telehealth: Payer: Self-pay

## 2013-10-08 NOTE — Telephone Encounter (Signed)
PT STATES HE HAD SENT A FORM OVER FOR DR SHAW TO FILL OUT REGARDING A SCAT TEST, HASN'T HEARD FROM ANYONE. PLEASE CALL [phone]

## 2013-10-09 NOTE — Telephone Encounter (Signed)
Have you gotten this?

## 2013-10-10 NOTE — Telephone Encounter (Signed)
Went through my box of papers today. Was not in my box. Will need new form - I think he is referring to a lab order for gene testing (SCAT-3 - see prior phone note) - hopefully [facility] can fax us whatever they need.

## 2013-10-13 NOTE — Telephone Encounter (Signed)
Have you gotten any forms?

## 2013-10-15 NOTE — Telephone Encounter (Signed)
I checked the scan boxes and did not see any forms for this patient.

## 2013-10-16 ENCOUNTER — Telehealth: Payer: Self-pay | Admitting: Radiology

## 2013-10-16 NOTE — Telephone Encounter (Signed)
Patient has taken order to lab for his genetic testing at [facility] lab for sca3 alleles he states he was told by the lab they need an order to get him a kit for this test. He states he needs the kit. He has the order, so I am not sure what else they need from [place]. I have asked Allie to call the lab and see what they need from [place], and if he can get the kit he needs.

## 2013-10-16 NOTE — Telephone Encounter (Signed)
Left message to return call 

## 2013-10-16 NOTE — Telephone Encounter (Signed)
Patient returning Allison's call.

## 2013-10-16 NOTE — Telephone Encounter (Signed)
Called him, advised we do not have the form. Left message.

## 2013-10-19 NOTE — Telephone Encounter (Signed)
Randy Ballard not sure what you were going to tell him. Please call him, im guessing the lab should have the kit.

## 2013-10-20 NOTE — Telephone Encounter (Signed)
Left message to return call.  Not sure if the previous order he was given has expired or what the issue was from [facility]. He needs to pick up new order for labs (up front). Need to let patient know when he takes this current order to lab and they have any questions or problems please call us.

## 2013-10-22 NOTE — Telephone Encounter (Signed)
lmom that order is ready for pickup.

## 2013-10-24 ENCOUNTER — Other Ambulatory Visit: Payer: Self-pay | Admitting: Family Medicine

## 2013-10-27 LAB — ATHENA MAIL

## 2013-10-31 ENCOUNTER — Ambulatory Visit (INDEPENDENT_AMBULATORY_CARE_PROVIDER_SITE_OTHER): Payer: Federal, State, Local not specified - PPO | Admitting: Family Medicine

## 2013-10-31 ENCOUNTER — Telehealth: Payer: Self-pay | Admitting: Radiology

## 2013-10-31 ENCOUNTER — Encounter: Payer: Self-pay | Admitting: Family Medicine

## 2013-10-31 VITALS — BP 152/88 | HR 73 | Temp 97.9°F | Resp 16 | Ht 66.0 in | Wt 223.6 lb

## 2013-10-31 DIAGNOSIS — I10 Essential (primary) hypertension: Secondary | ICD-10-CM

## 2013-10-31 DIAGNOSIS — Z79899 Other long term (current) drug therapy: Secondary | ICD-10-CM

## 2013-10-31 DIAGNOSIS — E785 Hyperlipidemia, unspecified: Secondary | ICD-10-CM

## 2013-10-31 DIAGNOSIS — E119 Type 2 diabetes mellitus without complications: Secondary | ICD-10-CM

## 2013-10-31 LAB — COMPLETE METABOLIC PANEL WITH GFR
ALBUMIN: 4.6 g/dL (ref 3.5–5.2)
ALT: 49 U/L (ref 0–53)
AST: 31 U/L (ref 0–37)
Alkaline Phosphatase: 57 U/L (ref 39–117)
BUN: 12 mg/dL (ref 6–23)
CALCIUM: 9.7 mg/dL (ref 8.4–10.5)
CHLORIDE: 103 meq/L (ref 96–112)
CO2: 25 meq/L (ref 19–32)
CREATININE: 0.94 mg/dL (ref 0.50–1.35)
GFR, Est Non African American: >89 mL/min
Glucose, Bld: 98 mg/dL (ref 70–99)
POTASSIUM: 3.8 meq/L (ref 3.5–5.3)
Sodium: 139 mEq/L (ref 135–145)
Total Bilirubin: 0.4 mg/dL (ref 0.3–1.2)
Total Protein: 7.4 g/dL (ref 6.0–8.3)

## 2013-10-31 MED ORDER — METOPROLOL SUCCINATE ER 100 MG PO TB24: 100.0000 mg | ORAL_TABLET | Freq: Every day | ORAL | Status: DC

## 2013-10-31 MED ORDER — LISINOPRIL-HYDROCHLOROTHIAZIDE 20-25 MG PO TABS: 1.0000 | ORAL_TABLET | Freq: Every day | ORAL | Status: DC

## 2013-10-31 MED ORDER — GLIMEPIRIDE 4 MG PO TABS
4.0000 mg | ORAL_TABLET | Freq: Every day | ORAL | Status: DC
Start: 2013-10-31 — End: 2014-03-06

## 2013-10-31 MED ORDER — ATORVASTATIN CALCIUM 40 MG PO TABS: 40.0000 mg | ORAL_TABLET | Freq: Every day | ORAL | Status: DC

## 2013-10-31 MED ORDER — METFORMIN HCL 1000 MG PO TABS: 1000.0000 mg | ORAL_TABLET | Freq: Two times a day (BID) | ORAL | Status: DC

## 2013-10-31 NOTE — Progress Notes (Signed)
Subjective:    Patient ID: Randy Ballard, male    DOB: Jan 30, 1961, 53 y.o.   MRN: [id number]  Chief Complaint  Patient presents with  . Follow-up    LAB RESULTS  . Medication Refill    METFORMIN   This chart was scribed for Sherren MochaEva N Navneet Schmuck, MD by Dorothey Basemania Sutton, ED Scribe.   HPI Randy Ballard is a 53 y.o. Male with a history of hypertension, type II DM, hypothyroid, dyslipidemia, and acute pancreatitis who presents to [facility] for a follow-up of chronic medical problems.  Patient also has a familial history (mother) of spinocerebellar ataxia type 3. Patient began displaying similar symptoms of ataxia and muscle weakness, so we are considering obtaining genetic testing for this condition. Patient states that he feels like the muscle weakness has been progressively worsening and is worse in the left leg than the right. 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 He states that he has followed up with physical therapy in the past and it was able to minimally improve his symptoms, but that he has not followed up recently.   Patient is also requesting a refill of his prescription for metformin. Patient has been working hard to alter his diet with significant improvement in the management of his diabetes. Patient's A1c were previously running in the 8-9 range and improved at his last visit to 5.6.  Was 6.1 on 09/22/2013 at his visit w/ Dr. Sharl MaKerr who is not managing his DM - but provided a c/s - and is managing the pt's hypothyroidism and thyroid nodule.  Patient's wife had surgery yesterday for HGSIL cervical ca and states that she handled the procedure well. Is at home w/ her helping her recover.  Past Medical History  Diagnosis Date  . Diabetes mellitus without complication   . Hyperlipidemia   . Hypertension   . Hypogonadism male   . Thyroid disease 02/06/2009    Graves s/p RAIU; cold nodule s/p  negative biopsy  . Diverticulitis    Current Outpatient Prescriptions on File Prior to Visit  Medication Sig Dispense Refill  . acetaminophen (TYLENOL) 500 MG tablet Take 1,000-1,500 mg by mouth every 6 (six) hours as needed for pain.      Marland Kitchen. aspirin 81 MG tablet Take 1 tablet (81 mg total) by mouth daily.  30 tablet  11  . atorvastatin (LIPITOR) 40 MG tablet Take 1 tablet (40 mg total) by mouth daily.  30 tablet  5  . glimepiride (AMARYL) 4 MG tablet Take 1 tablet (4 mg total) by mouth daily before breakfast.  30 tablet  5  . levothyroxine (SYNTHROID, LEVOTHROID) 125 MCG tablet Take 1 tablet (125 mcg total) by mouth daily.  90 tablet  1  . lisinopril-hydrochlorothiazide (PRINZIDE,ZESTORETIC) 20-25 MG per tablet Take 1 tablet by mouth daily.  30 tablet  5  . metFORMIN (GLUCOPHAGE) 1000 MG tablet Take 1 tablet (1,000 mg total) by mouth 2 (two) times daily with a meal.  60 tablet  5  . metoprolol succinate (TOPROL-XL) 100 MG 24 hr tablet Take 1 tablet (100 mg total) by mouth daily. Take with or immediately following a meal.  30 tablet  5   No current facility-administered medications on file prior to visit.   No Known Allergies  Review of Systems  Constitutional: Negative for fever, chills, appetite change and unexpected weight change.  Respiratory: Negative  for cough.   Gastrointestinal: Negative for vomiting, abdominal pain, diarrhea and constipation.  Skin: Negative for rash.  Neurological: Positive for weakness. Negative for dizziness and seizures.     Vitals: BP 152/88  Pulse 73  Temp(Src) 97.9 F (36.6 C) (Oral)  Resp 16  Ht 5\' 6"  (1.676 m)  Wt 223 lb 9.6 oz (101.424 kg)  BMI 36.11 kg/m2  SpO2 100%  Objective:   Physical Exam  Nursing note and vitals reviewed. Constitutional: He is oriented to person, place, and time. He appears well-developed and well-nourished. No distress.  HENT:  Head: Normocephalic and atraumatic.  Eyes: Conjunctivae are normal.  Neck: Normal range  of motion. Neck supple. No mass and no thyromegaly present.  Cardiovascular: Normal rate, regular rhythm and normal heart sounds.   Pulmonary/Chest: Effort normal and breath sounds normal. No respiratory distress.  Abdominal: He exhibits no distension.  Musculoskeletal: Normal range of motion.  Lymphadenopathy:    He has no cervical adenopathy.  Neurological: He is alert and oriented to person, place, and time.  Skin: Skin is warm and dry.  Psychiatric: He has a normal mood and affect. His behavior is normal.      Assessment & Plan:  3:44 PM-  Type II or unspecified type diabetes mellitus without mention of complication, not stated as uncontrolled - Plan: COMPLETE METABOLIC PANEL WITH GFR, metFORMIN (GLUCOPHAGE) 1000 MG tablet, lisinopril-hydrochlorothiazide (PRINZIDE,ZESTORETIC) 20-25 MG per tablet, glimepiride (AMARYL) 4 MG tablet - I am managing pt's DM but he sees Dr. Sharl Ma annually to monitor his s/o radioactive iodine hypothyroidism and thyroid nodule and I asked Dr. Sharl Ma to comment on pt's DM management since he was hospitalized this summer w/ pancreatitis - concern it was due to Januvia.  Since his episode of pancreatitis, he has worked very hard on maintaining his diet and his hgba1c is sig improved. Dr. Sharl Ma (c/s note scanned in) recommended not to put the patient on GLP-1 agonist or DPP-IV inhibitor. If the patient's diabetes is not controlled in the future, Dr. Sharl Ma recommended putting the patient on an SGLT2 antagonist. 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   Other and unspecified hyperlipidemia - Plan: COMPLETE METABOLIC PANEL WITH GFR, atorvastatin (LIPITOR) 40 MG tablet - needs flp at f/u visit in 4 mos.  Encounter for long-term (current) use of other medications  Essential hypertension, benign  Unspecified essential hypertension - Plan: metoprolol succinate (TOPROL-XL) 100 MG 24 hr tablet,  lisinopril-hydrochlorothiazide (PRINZIDE,ZESTORETIC) 20-25 MG per tablet  Hypothyroidism - recent thyroid US to re-eval thyroid nodule. Dr. Sharl Ma refilled pt's levothyroxine at 112 mcg qd - however, I had previously increased it to 125 mcg qd and looks like Dr. Sharl Ma might not have been aware of this. At next f/u visit, rec contacting pharmacy to confirm the levothyroxine dose pt is receiving and recheck tsh.  F/u in 4 mos  Muscular weakness - genetic testing Pending - still waiting on form for my signature. Discussed that pt will need to consider long-term regular PT.  Meds ordered this encounter  Medications  . metoprolol succinate (TOPROL-XL) 100 MG 24 hr tablet    Sig: Take 1 tablet (100 mg total) by mouth daily. Take with or immediately following a meal.    Dispense:  30 tablet    Refill:  5  . metFORMIN (GLUCOPHAGE) 1000 MG tablet    Sig: Take 1 tablet (1,000 mg total) by  mouth 2 (two) times daily with a meal.    Dispense:  60 tablet    Refill:  5  . lisinopril-hydrochlorothiazide (PRINZIDE,ZESTORETIC) 20-25 MG per tablet    Sig: Take 1 tablet by mouth daily.    Dispense:  30 tablet    Refill:  5  . glimepiride (AMARYL) 4 MG tablet    Sig: Take 1 tablet (4 mg total) by mouth daily before breakfast.    Dispense:  30 tablet    Refill:  5  . atorvastatin (LIPITOR) 40 MG tablet    Sig: Take 1 tablet (40 mg total) by mouth daily.    Dispense:  30 tablet    Refill:  5   Discussed treatment plan with patient at bedside and patient verbalized agreement.   I personally performed the services described in this documentation, which was scribed in my presence. The recorded information has been reviewed and considered, and addended by me as needed.  Norberto Sorenson, MD MPH

## 2013-10-31 NOTE — Patient Instructions (Signed)
You need to start looking into long-term disability options.  See what disability insurance your job contracts with and look at the long-term plans that they cover. Call the social security office - [phone] - to ask about how you get started on applying for disability. Call the [place] legal aide office [phone] - they might have some classes or talks on how to apply for disability - or at least have more papers for you on this. Consider consulting with a lawyer or an Risk manageraccountant/finanical manager as to how you should prepare for the future if you cannot work in another year.

## 2013-10-31 NOTE — Telephone Encounter (Signed)
Called [facility] today for test results for patient, these are pending and will take 42 days, the results will be faxed. The phone number for [facility] is [phone]

## 2013-11-01 ENCOUNTER — Encounter: Payer: Self-pay | Admitting: Family Medicine

## 2013-11-11 ENCOUNTER — Telehealth: Payer: Self-pay

## 2013-11-11 NOTE — Telephone Encounter (Signed)
Patient called returning Renee's Call. Please call patient ASAP.Marland Kitchen..Marland Kitchen

## 2013-11-11 NOTE — Telephone Encounter (Signed)
Left message for patient to call back  

## 2013-11-11 NOTE — Telephone Encounter (Signed)
Left message on machine for patient to call back.     Robin from [facility] called about patients lab cost.  The cost for the labs are around eight thousand plus.  Patient needs to call [facility] at [phone] to help with cost of these test.

## 2013-11-12 NOTE — Telephone Encounter (Signed)
Spoke with patient and advised who to call about price.   Patient states understanding.

## 2013-11-12 NOTE — Telephone Encounter (Signed)
Spoke with patient, see note in chart

## 2013-11-18 ENCOUNTER — Telehealth: Payer: Self-pay | Admitting: Physician Assistant

## 2013-11-18 DIAGNOSIS — M6281 Muscle weakness (generalized): Secondary | ICD-10-CM

## 2013-11-18 NOTE — Telephone Encounter (Signed)
Please call patient.  Let him know that we have received his genetic testing.  I am reviewing his results for Dr. Clelia CroftShaw as she is out on maternity leave.  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  All of the other testing is negative.  I am going to refer him to neurology for further discussion and management of this.  If he has questions I would be happy to talk with him

## 2013-11-20 ENCOUNTER — Telehealth: Payer: Self-pay | Admitting: Family Medicine

## 2013-11-20 NOTE — Telephone Encounter (Signed)
Spoke with patient and gave him results of labs and copy of labs

## 2013-12-17 ENCOUNTER — Ambulatory Visit (INDEPENDENT_AMBULATORY_CARE_PROVIDER_SITE_OTHER): Payer: Federal, State, Local not specified - PPO | Admitting: Diagnostic Neuroimaging

## 2013-12-17 ENCOUNTER — Encounter: Payer: Self-pay | Admitting: Diagnostic Neuroimaging

## 2013-12-17 VITALS — BP 153/75 | HR 71 | Temp 97.9°F | Ht 67.0 in | Wt 230.0 lb

## 2013-12-17 DIAGNOSIS — G118 Other hereditary ataxias: Secondary | ICD-10-CM | POA: Insufficient documentation

## 2013-12-17 NOTE — Progress Notes (Signed)
[facility]  PATIENT: Randy Ballard DOB: 18-Oct-1960  REFERRING CLINICIAN: Meriel Flavors HISTORY FROM: patient  REASON FOR VISIT: new consult   HISTORICAL  CHIEF COMPLAINT:  Chief Complaint  Patient presents with  . Extremity Weakness    muscular, balance problem    HISTORY OF PRESENT ILLNESS:  53 year old male here for valuation of SCA type 3. 2012 patient developed some muscle weakness, difficulty climbing stairs, balance difficulty. By 2014 his balance is significantly affected. He went through physical therapy without relief. On 11/14/2013 patient quit his job because of these balance and walking difficulties.  Patient's mother developed balance and walking problems 15 years ago (when she was 54 years old), resulting in multiple falls. Eventually she was transitioned to a nursing home. She was ultimately diagnosed with spinocerebellar ataxia type III. Patient's maternal grandmother and maternal aunt also have similar condition. Patient himself does not have any children. Patient has no siblings. Patient underwent gene testing which was positive for spinocerebellar ataxia type III.  Nowadays patient having some muscle twitching in his quadriceps, shoulders, arms as well as some slurred speech. He's having difficulty with uneven and slip resurfaces.   REVIEW OF SYSTEMS: Full 14 system review of systems performed and notable only for  aching muscles decreased energy numbness weakness restless legs feeling cold aching muscles impotence fatigue.  ALLERGIES: No Known Allergies  HOME MEDICATIONS: Outpatient Prescriptions Prior to Visit  Medication Sig Dispense Refill  . aspirin 81 MG tablet Take 1 tablet (81 mg total) by mouth daily.  30 tablet  11  . atorvastatin (LIPITOR) 40 MG tablet Take 1 tablet (40 mg total) by mouth daily.  30 tablet  5  . glimepiride (AMARYL) 4 MG tablet Take 1 tablet (4 mg total) by mouth daily before breakfast.  30 tablet  5  .  levothyroxine (SYNTHROID, LEVOTHROID) 125 MCG tablet Take 1 tablet (125 mcg total) by mouth daily.  90 tablet  1  . lisinopril-hydrochlorothiazide (PRINZIDE,ZESTORETIC) 20-25 MG per tablet Take 1 tablet by mouth daily.  30 tablet  5  . metFORMIN (GLUCOPHAGE) 1000 MG tablet Take 1 tablet (1,000 mg total) by mouth 2 (two) times daily with a meal.  60 tablet  5  . metoprolol succinate (TOPROL-XL) 100 MG 24 hr tablet Take 1 tablet (100 mg total) by mouth daily. Take with or immediately following a meal.  30 tablet  5   No facility-administered medications prior to visit.    PAST MEDICAL HISTORY: Past Medical History  Diagnosis Date  . Diabetes mellitus without complication   . Hyperlipidemia   . Hypertension   . Hypogonadism male   . 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 Diverticulitis     PAST SURGICAL HISTORY: Past Surgical History  Procedure Laterality Date  . Biopsy thyroid  02/06/2009    cold thyroid nodule; negative/benign.  . Colonoscopy w/ polypectomy  Feb 2014    5 polyps    FAMILY HISTORY: Family History  Problem Relation Age of Onset  . Adopted: Yes    SOCIAL HISTORY:  History   Social History  . Marital Status: Married    Spouse Name: Rosey Bath    Number of Children: 0  . Years of Education: HS   Occupational History  . service rep   . retired    Social History Main Topics  . Smoking status: Current Some Day Smoker -- 1.00 packs/day for 32 years  Types: Cigarettes  . Smokeless tobacco: Never Used  . Alcohol Use: No  . Drug Use: No  . Sexual Activity: Yes     Comment: 1 partner in last 12 months   Other Topics Concern  . Not on file   Social History Narrative   Marital status:  Married x 20 years; happily.      Children: none      Lives: with wife.      Employment: delivers oxygen; works for [employer]; drives locally      Tobacco: trying to quit 10/2012.      Alcohol:  Once per month      Drugs: none       Exercise: treadmill sporadic.     PHYSICAL EXAM  Filed Vitals:   12/17/13 1008  BP: 153/75  Pulse: 71  Temp: 97.9 F (36.6 C)  TempSrc: Oral  Height: 5\' 7"  (1.702 m)  Weight: 230 lb (104.327 kg)    Not recorded    Body mass index is 36.01 kg/(m^2).  GENERAL EXAM: Patient is in no distress; well developed, nourished and groomed; neck is supple  CARDIOVASCULAR: Regular rate and rhythm, no murmurs, no carotid bruits  NEUROLOGIC: MENTAL STATUS: awake, alert, oriented to person, place and time, recent and remote memory intact, normal attention and concentration, language fluent, comprehension intact, naming intact, fund of knowledge appropriate CRANIAL NERVE: no papilledema on fundoscopic exam, pupils equal and reactive to light, visual fields full to confrontation, extraocular muscles intact, MILD SACCADIC DYSMETRIA, facial sensation and strength symmetric, hearing intact, palate elevates symmetrically, uvula midline, shoulder shrug symmetric, tongue midline; MILD DYSARTHRIA. MOTOR: normal bulk and tone, full strength in the BUE, RLE; LLE 4/5 WITH INCR TONE.  SENSORY: ABSENT VIB AT TOES. NORMAL PP, LT, TEMP. COORDINATION: MILD DYSMETRIA WITH LUE > RUE REFLEXES: BUE TRACE, KNEES TRACE, ANKLES 0 GAIT/STATION: WISE BASED GAIT, ATAXIC GAIT. STAGGERS. CANNOT TANDEM, TOE OR HEEL WALK. CANNOT STAND FEET TOGETHER AND EYES OPEN.   DIAGNOSTIC DATA (LABS, IMAGING, TESTING) - I reviewed patient records, labs, notes, testing and imaging myself where available.  Lab Results  Component Value Date   WBC 8.1 07/19/2013   HGB 13.7* 07/19/2013   HCT 42.5* 07/19/2013   MCV 98.2* 07/19/2013   PLT 286 04/17/2013      Component Value Date/Time   NA 139 10/31/2013 1621   K 3.8 10/31/2013 1621   CL 103 10/31/2013 1621   CO2 25 10/31/2013 1621   GLUCOSE 98 10/31/2013 1621   BUN 12 10/31/2013 1621   CREATININE 0.94 10/31/2013 1621   CREATININE 0.80 04/17/2013 0515   CALCIUM 9.7 10/31/2013 1621    PROT 7.4 10/31/2013 1621   ALBUMIN 4.6 10/31/2013 1621   AST 31 10/31/2013 1621   ALT 49 10/31/2013 1621   ALKPHOS 57 10/31/2013 1621   BILITOT 0.4 10/31/2013 1621   GFRNONAA >90 04/17/2013 0515   GFRAA >90 04/17/2013 0515   Lab Results  Component Value Date   CHOL 93 04/16/2013   HDL 34* 04/16/2013   LDLCALC 34 04/16/2013   TRIG 126 04/16/2013   CHOLHDL 2.7 04/16/2013   Lab Results  Component Value Date   HGBA1C 5.6 07/19/2013   Lab Results  Component Value Date   VITAMINB12 817 11/15/2012   Lab Results  Component Value Date   TSH 2.957 07/19/2013    I reviewed images myself and agree with interpretation. -VRP  06/25/07 MRI brain (with and without)  1. 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  2. No anterior lobe pituitary lesions to suggest a micro- or macroadenoma resulting in thyroid hormonal dysfunction.  3. Tiny focus of increased white matter signal in the right pons, likely due to small vessel disease from diabetes/hypertension.   ASSESSMENT AND PLAN  53 y.o. year old male here with spinocerebellar ataxia type 3, based on symptoms, family history and gene testing. Unfortunately there is no specific treatment for this condition. Levodopa is sometimes used to help with tremor, stiffness and bradykinesia. Baclofen is sometimes used for also spasms. At this time patient's main issue is balance and walking. I will refer him to physical therapy and encouraged him to start using a rolling walker.  Dx: SCA 3  PLAN: - PT evaluation - use rollator walker - may consider levodopa and baclofen in future   Orders Placed This Encounter  Procedures  . Ambulatory referral to Physical Therapy   Return in about 4 months (around 04/18/2014).    Suanne MarkerVIKRAM R. PENUMALLI, MD 12/17/2013, 11:28 AM Certified in Neurology, Neurophysiology and Neuroimaging  [facility] [address], [address] [place], [place] [id number] [phone]

## 2013-12-17 NOTE — Patient Instructions (Signed)
Use a rollator walker.  See physical therapy.

## 2013-12-19 ENCOUNTER — Other Ambulatory Visit: Payer: Self-pay

## 2013-12-19 MED ORDER — GLUCOSE BLOOD VI STRP: ORAL_STRIP | Status: DC

## 2013-12-19 MED ORDER — LANCING DEVICE MISC: Status: DC

## 2013-12-19 MED ORDER — BLOOD GLUCOSE METER KIT: PACK | Status: DC

## 2013-12-19 MED ORDER — LANCETS MISC: Status: DC

## 2014-01-18 ENCOUNTER — Other Ambulatory Visit: Payer: Self-pay | Admitting: Family Medicine

## 2014-03-06 ENCOUNTER — Ambulatory Visit (INDEPENDENT_AMBULATORY_CARE_PROVIDER_SITE_OTHER): Payer: Federal, State, Local not specified - PPO | Admitting: Family Medicine

## 2014-03-06 ENCOUNTER — Encounter: Payer: Self-pay | Admitting: Family Medicine

## 2014-03-06 VITALS — BP 152/82 | HR 89 | Temp 98.3°F | Resp 20 | Ht 67.5 in | Wt 230.0 lb

## 2014-03-06 DIAGNOSIS — E039 Hypothyroidism, unspecified: Secondary | ICD-10-CM

## 2014-03-06 DIAGNOSIS — Z23 Encounter for immunization: Secondary | ICD-10-CM

## 2014-03-06 DIAGNOSIS — E785 Hyperlipidemia, unspecified: Secondary | ICD-10-CM

## 2014-03-06 DIAGNOSIS — G118 Other hereditary ataxias: Secondary | ICD-10-CM

## 2014-03-06 DIAGNOSIS — I10 Essential (primary) hypertension: Secondary | ICD-10-CM

## 2014-03-06 DIAGNOSIS — D72829 Elevated white blood cell count, unspecified: Secondary | ICD-10-CM

## 2014-03-06 DIAGNOSIS — Z79899 Other long term (current) drug therapy: Secondary | ICD-10-CM

## 2014-03-06 DIAGNOSIS — E119 Type 2 diabetes mellitus without complications: Secondary | ICD-10-CM

## 2014-03-06 LAB — COMPREHENSIVE METABOLIC PANEL
ALBUMIN: 4.7 g/dL (ref 3.5–5.2)
ALK PHOS: 68 U/L (ref 39–117)
ALT: 47 U/L (ref 0–53)
AST: 35 U/L (ref 0–37)
BUN: 19 mg/dL (ref 6–23)
CO2: 25 meq/L (ref 19–32)
Calcium: 9.4 mg/dL (ref 8.4–10.5)
Chloride: 99 mEq/L (ref 96–112)
Creat: 1.05 mg/dL (ref 0.50–1.35)
Glucose, Bld: 189 mg/dL — ABNORMAL HIGH (ref 70–99)
Potassium: 3.9 mEq/L (ref 3.5–5.3)
SODIUM: 135 meq/L (ref 135–145)
TOTAL PROTEIN: 7.5 g/dL (ref 6.0–8.3)
Total Bilirubin: 0.4 mg/dL (ref 0.2–1.2)

## 2014-03-06 LAB — LIPID PANEL
Cholesterol: 187 mg/dL (ref 0–200)
HDL: 34 mg/dL — ABNORMAL LOW (ref >39)
LDL Cholesterol: 111 mg/dL — ABNORMAL HIGH (ref 0–99)
Total CHOL/HDL Ratio: 5.5 Ratio
Triglycerides: 212 mg/dL — ABNORMAL HIGH (ref <150)
VLDL: 42 mg/dL — ABNORMAL HIGH (ref 0–40)

## 2014-03-06 LAB — CBC WITH DIFFERENTIAL/PLATELET
BASOS PCT: 0 % (ref 0–1)
Basophils Absolute: 0 10*3/uL (ref 0.0–0.1)
EOS ABS: 0.2 10*3/uL (ref 0.0–0.7)
EOS PCT: 2 % (ref 0–5)
HEMATOCRIT: 40.7 % (ref 39.0–52.0)
Hemoglobin: 13.8 g/dL (ref 13.0–17.0)
Lymphocytes Relative: 24 % (ref 12–46)
Lymphs Abs: 2 10*3/uL (ref 0.7–4.0)
MCH: 30.5 pg (ref 26.0–34.0)
MCHC: 33.9 g/dL (ref 30.0–36.0)
MCV: 90 fL (ref 78.0–100.0)
MONO ABS: 0.7 10*3/uL (ref 0.1–1.0)
Monocytes Relative: 8 % (ref 3–12)
Neutro Abs: 5.5 10*3/uL (ref 1.7–7.7)
Neutrophils Relative %: 66 % (ref 43–77)
Platelets: 291 10*3/uL (ref 150–400)
RBC: 4.52 MIL/uL (ref 4.22–5.81)
RDW: 12.6 % (ref 11.5–15.5)
WBC: 8.3 10*3/uL (ref 4.0–10.5)

## 2014-03-06 LAB — POCT GLYCOSYLATED HEMOGLOBIN (HGB A1C): HEMOGLOBIN A1C: 7.3

## 2014-03-06 LAB — TSH: TSH: 3.927 u[IU]/mL (ref 0.350–4.500)

## 2014-03-06 MED ORDER — METFORMIN HCL 1000 MG PO TABS: 1000.0000 mg | ORAL_TABLET | Freq: Two times a day (BID) | ORAL | Status: DC

## 2014-03-06 MED ORDER — METOPROLOL SUCCINATE ER 100 MG PO TB24: 100.0000 mg | ORAL_TABLET | Freq: Every day | ORAL | Status: DC

## 2014-03-06 MED ORDER — GLIMEPIRIDE 4 MG PO TABS: 8.0000 mg | ORAL_TABLET | Freq: Every day | ORAL | Status: DC

## 2014-03-06 MED ORDER — LISINOPRIL-HYDROCHLOROTHIAZIDE 20-25 MG PO TABS: 1.0000 | ORAL_TABLET | Freq: Every day | ORAL | Status: DC

## 2014-03-06 NOTE — Progress Notes (Addendum)
Subjective:    Patient ID: Randy Ballard, male    DOB: 1961/01/14, 53 y.o.   MRN: [id number]  Chief Complaint  Patient presents with  . Medication Refill   HPI This chart was scribed for Shawnee Knapp, MD by Ladene Artist, ED Scribe. The patient was seen in room 24. Patient's care was started at 8:36 AM.  HPI Comments: Randy Ballard is a 53 y.o. male, with a h/o DM, HTN, SCA-3, who presents to the [facility] for medication refill.   Pt states that he has not checked blood sugars in a while. Last check was in the 140s after he ate breakfast.   He does check his BP and reports elevated levels due to financial stress. Average at home readings range from 121-135/76-80.   Pt sees Dr. Wylene Simmer for hypothyroid mngmnt (iatrogenic after radiation therapy for Graves w/ a hypofunctioning nodule) once a year. Pt states that he takes 125 mg of Levothyroxine qd.   His gain and muscle weakness from the SCA3 cont to slowly worsen.  Pt reports a fall in the church's parking lot a few months ago. He states that he often drags his feet with walking and he tripped and fell. He states that he needed assistance getting off the ground due to weakness. His wife purchased him a cane afterwards. Pt has not started physical therapy yet due to financial reasons. He is still awaiting disability approval which he applied for in March.  Pt reports muscle spasms/tremors in his R hand, R upper leg and L shoulder. Episodes last for 5-10 minutes. Pt also expresses his desire for a handicap parking decal due to spasms and weakness.  Pt quit working February 2015. He reports walking around in [place] for approximately 1 hour for exercise and is getting outside doing a lot of surgery.  Pt reports that his wife is doing well following her surgery - full hysterectomy for cervical cancer.  She is returning back to work now.  Past Medical History  Diagnosis Date  . Diabetes mellitus without complication   .  Hyperlipidemia   . Hypertension   . Hypogonadism male   . 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 Diverticulitis   . SCA-3 (spinocerebellar ataxia type 3)    Current Outpatient Prescriptions on File Prior to Visit  Medication Sig Dispense Refill  . aspirin 81 MG tablet Take 1 tablet (81 mg total) by mouth daily.  30 tablet  11  . atorvastatin (LIPITOR) 40 MG tablet Take 1 tablet (40 mg total) by mouth daily.  30 tablet  5  . Blood Glucose Monitoring Suppl (BLOOD GLUCOSE METER) kit Test blood sugar daily  1 each  0  . glimepiride (AMARYL) 4 MG tablet Take 1 tablet (4 mg total) by mouth daily before breakfast.  30 tablet  5  . glucose blood test strip Test blood sugar daily.  100 each  3  . Lancet Devices (LANCING DEVICE) MISC Test blood sugar daily.  1 each  0  . Lancets MISC Test blood sugar daily  100 each  3  . levothyroxine (SYNTHROID, LEVOTHROID) 125 MCG tablet take 1 tablet by mouth once daily  90 tablet  0  . lisinopril-hydrochlorothiazide (PRINZIDE,ZESTORETIC) 20-25 MG per tablet Take 1 tablet by mouth daily.  30 tablet  5  . metFORMIN (GLUCOPHAGE) 1000 MG tablet Take 1 tablet (1,000 mg total) by mouth 2 (two) times  daily with a meal.  60 tablet  5  . metoprolol succinate (TOPROL-XL) 100 MG 24 hr tablet Take 1 tablet (100 mg total) by mouth daily. Take with or immediately following a meal.  30 tablet  5   No current facility-administered medications on file prior to visit.   No Known Allergies   Review of Systems  Constitutional: Negative for fever and chills.  HENT: Positive for voice change.        Occasional slurred speech  Gastrointestinal: Negative for nausea, vomiting, abdominal pain, diarrhea and constipation.  Genitourinary: Negative for urgency, frequency, decreased urine volume and difficulty urinating.  Musculoskeletal: Positive for arthralgias, back pain, gait problem and myalgias.  Skin: Negative for color change and  wound.  Neurological: Positive for tremors, speech difficulty and weakness. Negative for dizziness, seizures, syncope, light-headedness and numbness.  Psychiatric/Behavioral: Negative for dysphoric mood. The patient is not nervous/anxious.      BP 152/82  Pulse 89  Temp(Src) 98.3 F (36.8 C) (Oral)  Resp 20  Ht 5' 7.5" (1.715 m)  Wt 230 lb (104.327 kg)  BMI 35.47 kg/m2  SpO2 98% Objective:   Physical Exam  Nursing note and vitals reviewed. Constitutional: He is oriented to person, place, and time. He appears well-developed and well-nourished. No distress.  HENT:  Head: Normocephalic and atraumatic.  Eyes: EOM are normal.  Neck: Neck supple.  Cardiovascular: Normal rate, regular rhythm, S1 normal, S2 normal and normal heart sounds.   Pulmonary/Chest: Effort normal. No respiratory distress.  Musculoskeletal: Normal range of motion.  Neurological: He is alert and oriented to person, place, and time.  Skin: Skin is warm and dry.  Psychiatric: He has a normal mood and affect. His behavior is normal.      Results for orders placed in visit on 03/06/14  POCT GLYCOSYLATED HEMOGLOBIN (HGB A1C)      Result Value Ref Range   Hemoglobin A1C 7.3     Assessment & Plan:   Type II or unspecified type diabetes mellitus without mention of complication, not stated as uncontrolled - Plan: HM Diabetes Foot Exam, lisinopril-hydrochlorothiazide (PRINZIDE,ZESTORETIC) 20-25 MG per tablet, POCT glycosylated hemoglobin (Hb A1C), metFORMIN (GLUCOPHAGE) 1000 MG tablet, glimepiride (AMARYL) 4 MG tablet - increase amaryl from 4 qam to 8 qam as a1c has increased from 5.9 -> 7.3 since pt has stopped working - he has gained 15 lbs as well. Plans to work on diet and exercise this summer. Recheck in 3 mos. If not improving may want to either add in SGLT2 or Lantus and decrease amaryl back down (or stop).  SCA-3 (spinocerebellar ataxia type 3) - can't afford PT currently, followed by neurology who may consider  adding prn baclofen or levodopa in future.  Leukocytosis - Plan: CBC with Differential  Hypothyroid - Plan: TSH - on levothyroxine 125 mcg - refill AFTER TSH returns. TSH nml, dose of 125 mcg unchanged  Dyslipidemia - Plan: Lipid panel - refill lipitor AFTER lipid panel returns to ensure at goal.  Lipids above goal - ldl 111, cont lipitor 40 and work on diet/exercise. Recheck in 6 mos and increase med if not improved at that time.  Unspecified essential hypertension - Plan: lisinopril-hydrochlorothiazide (PRINZIDE,ZESTORETIC) 20-25 MG per tablet, metoprolol succinate (TOPROL-XL) 100 MG 24 hr tablet - elevated today but at goal at home. Cont to check outside office  Encounter for long-term (current) use of other medications - Plan: Comprehensive metabolic panel  Need for prophylactic vaccination with combined diphtheria-tetanus-pertussis (DTP) vaccine - Plan:  Tdap vaccine greater than or equal to 7yo IM  Meds ordered this encounter  Medications  . lisinopril-hydrochlorothiazide (PRINZIDE,ZESTORETIC) 20-25 MG per tablet    Sig: Take 1 tablet by mouth daily.    Dispense:  30 tablet    Refill:  5  . metoprolol succinate (TOPROL-XL) 100 MG 24 hr tablet    Sig: Take 1 tablet (100 mg total) by mouth daily. Take with or immediately following a meal.    Dispense:  30 tablet    Refill:  5  . metFORMIN (GLUCOPHAGE) 1000 MG tablet    Sig: Take 1 tablet (1,000 mg total) by mouth 2 (two) times daily with a meal.    Dispense:  60 tablet    Refill:  5  . glimepiride (AMARYL) 4 MG tablet    Sig: Take 2 tablets (8 mg total) by mouth daily with breakfast.    Dispense:  60 tablet    Refill:  3    I personally performed the services described in this documentation, which was scribed in my presence. The recorded information has been reviewed and considered, and addended by me as needed.  [name], MD MPH

## 2014-03-06 NOTE — Patient Instructions (Addendum)
Systolic pressure of 120 to 139 or diastolic pressure of 80 to 89: Follow up with me in 6 mos.  Systolic pressure of 140 to 160 or diastolic pressure of 90 to 100: Follow up with me within 1 months.  Systolic pressure above 160 or diastolic pressure above 100: Follow up with your caregiver within 1 week.  Systolic pressure above 180 or diastolic pressure above 110: Come to clinic immediately for evaluation.  Systolic pressure above 200 or diastolic pressure above 120: Go to ER immediately for evaluation and treatment - consider calling 911 if you feel abnormal at all (chest pain, headache, vision change, lightheaded, etc). Managing Your High Blood Pressure Blood pressure is a measurement of how forceful your blood is pressing against the walls of the arteries. Arteries are muscular tubes within the circulatory system. Blood pressure does not stay the same. Blood pressure rises when you are active, excited, or nervous; and it lowers during sleep and relaxation. If the numbers measuring your blood pressure stay above normal most of the time, you are at risk for health problems. High blood pressure (hypertension) is a long-term (chronic) condition in which blood pressure is elevated. A blood pressure reading is recorded as two numbers, such as 120 over 80 (or 120/80). The first, higher number is called the systolic pressure. It is a measure of the pressure in your arteries as the heart beats. The second, lower number is called the diastolic pressure. It is a measure of the pressure in your arteries as the heart relaxes between beats.  Keeping your blood pressure in a normal range is important to your overall health and prevention of health problems, such as heart disease and stroke. When your blood pressure is uncontrolled, your heart has to work harder than normal. High blood pressure is a very common condition in adults because blood pressure tends to rise with age. Men and women are equally likely to  have hypertension but at different times in life. Before age 53, men are more likely to have hypertension. After 54 years of age, women are more likely to have it. Hypertension is especially common in African Americans. This condition often has no signs or symptoms. The cause of the condition is usually not known. Your caregiver can help you come up with a plan to keep your blood pressure in a normal, healthy range. BLOOD PRESSURE STAGES Blood pressure is classified into four stages: normal, prehypertension, stage 1, and stage 2. Your blood pressure reading will be used to determine what type of treatment, if any, is necessary. Appropriate treatment options are tied to these four stages:  Normal  Systolic pressure (mm Hg): below 120.  Diastolic pressure (mm Hg): below 80. Prehypertension  Systolic pressure (mm Hg): 120 to 139.  Diastolic pressure (mm Hg): 80 to 89. Stage1  Systolic pressure (mm Hg): 140 to 159.  Diastolic pressure (mm Hg): 90 to 99. Stage2  Systolic pressure (mm Hg): 160 or above.  Diastolic pressure (mm Hg): 100 or above. RISKS RELATED TO HIGH BLOOD PRESSURE Managing your blood pressure is an important responsibility. Uncontrolled high blood pressure can lead to:  A heart attack.  A stroke.  A weakened blood vessel (aneurysm).  Heart failure.  Kidney damage.  Eye damage.  Metabolic syndrome.  Memory and concentration problems. HOW TO MANAGE YOUR BLOOD PRESSURE Blood pressure can be managed effectively with lifestyle changes and medicines (if needed). Your caregiver will help you come up with a plan to bring your blood  pressure within a normal range. Your plan should include the following: Education  Read all information provided by your caregivers about how to control blood pressure.  Educate yourself on the latest guidelines and treatment recommendations. New research is always being done to further define the risks and treatments for high blood  pressure. Lifestylechanges  Control your weight.  Avoid smoking.  Stay physically active.  Reduce the amount of salt in your diet.  Reduce stress.  Control any chronic conditions, such as high cholesterol or diabetes.  Reduce your alcohol intake. Medicines  Several medicines (antihypertensive medicines) are available, if needed, to bring blood pressure within a normal range. Communication  Review all the medicines you take with your caregiver because there may be side effects or interactions.  Talk with your caregiver about your diet, exercise habits, and other lifestyle factors that may be contributing to high blood pressure.  See your caregiver regularly. Your caregiver can help you create and adjust your plan for managing high blood pressure. RECOMMENDATIONS FOR TREATMENT AND FOLLOW-UP  The following recommendations are based on current guidelines for managing high blood pressure in nonpregnant adults. Use these recommendations to identify the proper follow-up period or treatment option based on your blood pressure reading. You can discuss these options with your caregiver.  Systolic pressure of 120 to 139 or diastolic pressure of 80 to 89: Follow up with your caregiver as directed.  Systolic pressure of 140 to 160 or diastolic pressure of 90 to 100: Follow up with your caregiver within 2 months.  Systolic pressure above 160 or diastolic pressure above 100: Follow up with your caregiver within 1 month.  Systolic pressure above 180 or diastolic pressure above 110: Consider antihypertensive therapy; follow up with your caregiver within 1 week.  Systolic pressure above 200 or diastolic pressure above 120: Begin antihypertensive therapy; follow up with your caregiver within 1 week. Document Released: 06/19/2012 Document Reviewed: 06/19/2012 [facility] Patient Information 2014 [place], [place]. Diabetes and Exercise Exercising regularly is important. It is not just about losing  weight. It has many health benefits, such as:  Improving your overall fitness, flexibility, and endurance.  Increasing your bone density.  Helping with weight control.  Decreasing your body fat.  Increasing your muscle strength.  Reducing stress and tension.  Improving your overall health. People with diabetes who exercise gain additional benefits because exercise:  Reduces appetite.  Improves the body's use of blood sugar (glucose).  Helps lower or control blood glucose.  Decreases blood pressure.  Helps control blood lipids (such as cholesterol and triglycerides).  Improves the body's use of the hormone insulin by:  Increasing the body's insulin sensitivity.  Reducing the body's insulin needs.  Decreases the risk for heart disease because exercising:  Lowers cholesterol and triglycerides levels.  Increases the levels of good cholesterol (such as high-density lipoproteins [HDL]) in the body.  Lowers blood glucose levels. YOUR ACTIVITY PLAN  Choose an activity that you enjoy and set realistic goals. Your health care provider or diabetes educator can help you make an activity plan that works for you. You can break activities into 2 or 3 sessions throughout the day. Doing so is as good as one long session. Exercise ideas include:  Taking the dog for a walk.  Taking the stairs instead of the elevator.  Dancing to your favorite song.  Doing your favorite exercise with a friend. RECOMMENDATIONS FOR EXERCISING WITH TYPE 1 OR TYPE 2 DIABETES   Check your blood glucose before exercising. If  blood glucose levels are greater than 240 mg/dL, check for urine ketones. Do not exercise if ketones are present.  Avoid injecting insulin into areas of the body that are going to be exercised. For example, avoid injecting insulin into:  The arms when playing tennis.  The legs when jogging.  Keep a record of:  Food intake before and after you exercise.  Expected peak times  of insulin action.  Blood glucose levels before and after you exercise.  The type and amount of exercise you have done.  Review your records with your health care provider. Your health care provider will help you to develop guidelines for adjusting food intake and insulin amounts before and after exercising.  If you take insulin or oral hypoglycemic agents, watch for signs and symptoms of hypoglycemia. They include:  Dizziness.  Shaking.  Sweating.  Chills.  Confusion.  Drink plenty of water while you exercise to prevent dehydration or heat stroke. Body water is lost during exercise and must be replaced.  Talk to your health care provider before starting an exercise program to make sure it is safe for you. Remember, almost any type of activity is better than none. Document Released: 12/16/2003 Document Revised: 05/28/2013 Document Reviewed: 03/04/2013 [facility]: [facility] Patient Information 2014 [employer].

## 2014-03-09 MED ORDER — ATORVASTATIN CALCIUM 40 MG PO TABS: 40.0000 mg | ORAL_TABLET | Freq: Every day | ORAL | Status: DC

## 2014-03-09 MED ORDER — LEVOTHYROXINE SODIUM 125 MCG PO TABS: ORAL_TABLET | ORAL | Status: DC

## 2014-03-09 NOTE — Addendum Note (Signed)
Addended by: Norberto Sorenson on: 03/09/2014 06:17 AM   Modules accepted: Orders

## 2014-04-20 ENCOUNTER — Ambulatory Visit (INDEPENDENT_AMBULATORY_CARE_PROVIDER_SITE_OTHER): Payer: Federal, State, Local not specified - PPO | Admitting: Diagnostic Neuroimaging

## 2014-04-20 ENCOUNTER — Encounter: Payer: Self-pay | Admitting: Diagnostic Neuroimaging

## 2014-04-20 VITALS — BP 144/84 | HR 88 | Temp 98.1°F | Ht 67.0 in | Wt 233.6 lb

## 2014-04-20 DIAGNOSIS — G118 Other hereditary ataxias: Secondary | ICD-10-CM

## 2014-04-20 NOTE — Progress Notes (Signed)
[facility]  PATIENT: Randy Ballard DOB: 1961/04/21  REFERRING CLINICIAN:  HISTORY FROM: patient  REASON FOR VISIT: follow up   HISTORICAL  CHIEF COMPLAINT:  Chief Complaint  Patient presents with  . Follow-up    sca-3    HISTORY OF PRESENT ILLNESS:   UPDATE 04/20/14: Since last visit, sxs are stable. Now approved for SSI/SSD, due to start in Sept 2015. Not able to afford PT yet, but hopefully in Oct 2015. Int muscle twitching, without pain.   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 2012 patient developed some muscle weakness, difficulty climbing stairs, balance difficulty. By 2014 his balance is significantly affected. He went through physical therapy without relief. On 11/14/2013 patient quit his job because of these balance and walking difficulties. Patient's mother developed balance and walking problems 15 years ago (when she was 54 years old), resulting in multiple falls. Eventually she was transitioned to a nursing home. She was ultimately diagnosed with spinocerebellar ataxia type III. Patient's maternal grandmother and maternal aunt also have similar condition. Patient himself does not have any children. Patient has no siblings. Patient underwent gene testing which was positive for spinocerebellar ataxia type III. Nowadays patient having some muscle twitching in his quadriceps, shoulders, arms as well as some slurred speech. He's having difficulty with uneven and slip resurfaces.  REVIEW OF SYSTEMS: Full 14 system review of systems performed and notable only for numbness weakness back pain muscle cramp walking diff.  ALLERGIES: No Known Allergies  HOME MEDICATIONS: Outpatient Prescriptions Prior to Visit  Medication Sig Dispense Refill  . aspirin 81 MG tablet Take 1 tablet (81 mg total) by mouth daily.  30 tablet  11  . atorvastatin (LIPITOR) 40 MG tablet Take 1 tablet (40 mg total) by mouth daily.  30 tablet  5  .  Blood Glucose Monitoring Suppl (BLOOD GLUCOSE METER) kit Test blood sugar daily  1 each  0  . glimepiride (AMARYL) 4 MG tablet Take 2 tablets (8 mg total) by mouth daily with breakfast.  60 tablet  3  . glucose blood test strip Test blood sugar daily.  100 each  3  . Lancet Devices (LANCING DEVICE) MISC Test blood sugar daily.  1 each  0  . Lancets MISC Test blood sugar daily  100 each  3  . levothyroxine (SYNTHROID, LEVOTHROID) 125 MCG tablet take 1 tablet by mouth once daily  90 tablet  1  . lisinopril-hydrochlorothiazide (PRINZIDE,ZESTORETIC) 20-25 MG per tablet Take 1 tablet by mouth daily.  30 tablet  5  . metFORMIN (GLUCOPHAGE) 1000 MG tablet Take 1 tablet (1,000 mg total) by mouth 2 (two) times daily with a meal.  60 tablet  5  . metoprolol succinate (TOPROL-XL) 100 MG 24 hr tablet Take 1 tablet (100 mg total) by mouth daily. Take with or immediately following a meal.  30 tablet  5   No facility-administered medications prior to visit.    PAST MEDICAL HISTORY: Past Medical History  Diagnosis Date  . Diabetes mellitus without complication   . Hyperlipidemia   . Hypertension   . Hypogonadism male   . 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 Diverticulitis   . SCA-3 (spinocerebellar ataxia type 3)     PAST SURGICAL HISTORY: Past Surgical History  Procedure Laterality Date  . Biopsy thyroid  02/06/2009    cold thyroid nodule; negative/benign.  . Colonoscopy w/ polypectomy  Feb 2014    5 polyps    FAMILY HISTORY: Family History  Problem Relation Age of Onset  . Adopted: Yes  . Ataxia Mother 42    SCA3  . Ataxia Maternal Aunt     SCA3  . Ataxia Maternal Grandmother     SCA3    SOCIAL HISTORY:  History   Social History  . Marital Status: Married    Spouse Name: Helene Kelp    Number of Children: 0  . Years of Education: HS   Occupational History  . service rep   . retired    Social History Main Topics  . Smoking status:  Current Some Day Smoker -- 1.00 packs/day for 32 years    Types: Cigarettes  . Smokeless tobacco: Never Used  . Alcohol Use: No  . Drug Use: No  . Sexual Activity: Yes     Comment: 1 partner in last 12 months   Other Topics Concern  . Not on file   Social History Narrative   Marital status:  Married x 20 years; happily.      Children: none      Lives: with wife.      Employment: delivers oxygen; works for [employer]; drives locally      Tobacco: trying to quit 10/2012.      Alcohol:  Once per month      Drugs: none      Exercise: treadmill sporadic.    PHYSICAL EXAM  Filed Vitals:   04/20/14 1056  BP: 144/84  Pulse: 88  Temp: 98.1 F (36.7 C)  TempSrc: Oral  Height: _0  (1.702 m)  Weight: 233 lb 9.6 oz (105.96 kg)    Not recorded    Body mass index is 36.58 kg/(m^2).  GENERAL EXAM: Patient is in no distress; well developed, nourished and groomed; neck is supple  CARDIOVASCULAR: Regular rate and rhythm, no murmurs, no carotid bruits  NEUROLOGIC: MENTAL STATUS: awake, alert, oriented to person, place and time, recent and remote memory intact, normal attention and concentration, language fluent, comprehension intact, naming intact, fund of knowledge appropriate CRANIAL NERVE: no papilledema on fundoscopic exam, pupils equal and reactive to light, visual fields full to confrontation, extraocular muscles intact, MILD SACCADIC DYSMETRIA, facial sensation and strength symmetric, hearing intact, palate elevates symmetrically, uvula midline, shoulder shrug symmetric, tongue midline; MILD DYSARTHRIA. MOTOR: normal bulk and tone, full strength in the BUE, RLE; LLE 4/5 WITH INCR TONE. BRADYKINESIA IN BUE AND BLLE (WORSE IN LLE)  SENSORY: ABSENT VIB AT TOES. NORMAL PP, LT, TEMP. COORDINATION: MILD DYSMETRIA WITH LUE > RUE REFLEXES: BUE TRACE, KNEES TRACE, ANKLES 0 GAIT/STATION: WISE BASED GAIT, ATAXIC GAIT. STAGGERS. CANNOT TANDEM, TOE OR HEEL WALK. CANNOT STAND FEET TOGETHER AND  EYES OPEN.   DIAGNOSTIC DATA (LABS, IMAGING, TESTING) - I reviewed patient records, labs, notes, testing and imaging myself where available.  Lab Results  Component Value Date   WBC 8.3 03/06/2014   HGB 13.8 03/06/2014   HCT 40.7 03/06/2014   MCV 90.0 03/06/2014   PLT 291 03/06/2014      Component Value Date/Time   NA 135 03/06/2014 0900   K 3.9 03/06/2014 0900   CL 99 03/06/2014 0900   CO2 25 03/06/2014 0900   GLUCOSE 189* 03/06/2014 0900   BUN 19 03/06/2014 0900   CREATININE 1.05 03/06/2014 0900   CREATININE 0.80 04/17/2013 0515   CALCIUM 9.4 03/06/2014 0900   PROT 7.5 03/06/2014 0900   ALBUMIN 4.7 03/06/2014 0900  AST 35 03/06/2014 0900   ALT 47 03/06/2014 0900   ALKPHOS 68 03/06/2014 0900   BILITOT 0.4 03/06/2014 0900   GFRNONAA >89 10/31/2013 1621   GFRNONAA >90 04/17/2013 0515   GFRAA >89 10/31/2013 1621   GFRAA >90 04/17/2013 0515   Lab Results  Component Value Date   CHOL 187 03/06/2014   HDL 34* 03/06/2014   LDLCALC 111* 03/06/2014   TRIG 212* 03/06/2014   CHOLHDL 5.5 03/06/2014   Lab Results  Component Value Date   HGBA1C 7.3 03/06/2014   Lab Results  Component Value Date   VITAMINB12 817 11/15/2012   Lab Results  Component Value Date   TSH 3.927 03/06/2014    I reviewed images myself and agree with interpretation. -VRP  06/25/07 MRI brain (with and without)  1. 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  2. No anterior lobe pituitary lesions to suggest a micro- or macroadenoma resulting in thyroid hormonal dysfunction.  3. Tiny focus of increased white matter signal in the right pons, likely due to small vessel disease from diabetes/hypertension.   ASSESSMENT AND PLAN  53 y.o. year old male here with spinocerebellar ataxia type 3, based on symptoms, family history and gene testing. Unfortunately there is no specific treatment for this condition. Levodopa is sometimes used to help with tremor, stiffness and bradykinesia. Baclofen is  sometimes used for also spasms. At this time patient's main issue is balance and walking. I will refer him to physical therapy in Oct 2015 when he can afford it (SSI due to start).   Dx: SCA 3  PLAN: - PT evaluation - use rollator walker - may consider levodopa and baclofen in future  Return in about 6 months (around 10/21/2014).    Penni Bombard, MD 4/92/0100, 71:21 AM Certified in Neurology, Neurophysiology and Neuroimaging  [facility] [address] [place], [place] [address] [phone]

## 2014-04-20 NOTE — Patient Instructions (Signed)
Try physical therapy in Oct 2015.

## 2014-04-21 DIAGNOSIS — Z0289 Encounter for other administrative examinations: Secondary | ICD-10-CM

## 2014-04-22 DIAGNOSIS — Z0271 Encounter for disability determination: Secondary | ICD-10-CM

## 2014-06-12 ENCOUNTER — Ambulatory Visit: Payer: Federal, State, Local not specified - PPO | Admitting: Family Medicine

## 2014-07-03 ENCOUNTER — Encounter: Payer: Self-pay | Admitting: Family Medicine

## 2014-07-03 ENCOUNTER — Ambulatory Visit (INDEPENDENT_AMBULATORY_CARE_PROVIDER_SITE_OTHER): Payer: Federal, State, Local not specified - PPO | Admitting: Family Medicine

## 2014-07-03 VITALS — BP 142/74 | HR 77 | Temp 98.2°F | Resp 16 | Ht 67.0 in | Wt 227.0 lb

## 2014-07-03 DIAGNOSIS — B351 Tinea unguium: Secondary | ICD-10-CM

## 2014-07-03 DIAGNOSIS — Z79899 Other long term (current) drug therapy: Secondary | ICD-10-CM

## 2014-07-03 DIAGNOSIS — E1149 Type 2 diabetes mellitus with other diabetic neurological complication: Secondary | ICD-10-CM

## 2014-07-03 DIAGNOSIS — E119 Type 2 diabetes mellitus without complications: Secondary | ICD-10-CM

## 2014-07-03 DIAGNOSIS — I10 Essential (primary) hypertension: Secondary | ICD-10-CM

## 2014-07-03 DIAGNOSIS — Z23 Encounter for immunization: Secondary | ICD-10-CM

## 2014-07-03 DIAGNOSIS — E785 Hyperlipidemia, unspecified: Secondary | ICD-10-CM

## 2014-07-03 DIAGNOSIS — E032 Hypothyroidism due to medicaments and other exogenous substances: Secondary | ICD-10-CM

## 2014-07-03 DIAGNOSIS — E118 Type 2 diabetes mellitus with unspecified complications: Secondary | ICD-10-CM

## 2014-07-03 DIAGNOSIS — G118 Other hereditary ataxias: Secondary | ICD-10-CM

## 2014-07-03 LAB — COMPREHENSIVE METABOLIC PANEL
ALT: 52 U/L (ref 0–53)
AST: 37 U/L (ref 0–37)
Albumin: 4.6 g/dL (ref 3.5–5.2)
Alkaline Phosphatase: 73 U/L (ref 39–117)
BUN: 18 mg/dL (ref 6–23)
CO2: 23 mEq/L (ref 19–32)
CREATININE: 0.96 mg/dL (ref 0.50–1.35)
Calcium: 9.5 mg/dL (ref 8.4–10.5)
Chloride: 102 mEq/L (ref 96–112)
Glucose, Bld: 124 mg/dL — ABNORMAL HIGH (ref 70–99)
Potassium: 4.1 mEq/L (ref 3.5–5.3)
Sodium: 138 mEq/L (ref 135–145)
Total Bilirubin: 0.7 mg/dL (ref 0.2–1.2)
Total Protein: 7.8 g/dL (ref 6.0–8.3)

## 2014-07-03 LAB — TSH: TSH: 2.229 u[IU]/mL (ref 0.350–4.500)

## 2014-07-03 LAB — LIPID PANEL
CHOL/HDL RATIO: 4 ratio
Cholesterol: 112 mg/dL (ref 0–200)
HDL: 28 mg/dL — AB (ref >39)
LDL Cholesterol: 56 mg/dL (ref 0–99)
Triglycerides: 141 mg/dL (ref <150)
VLDL: 28 mg/dL (ref 0–40)

## 2014-07-03 LAB — POCT GLYCOSYLATED HEMOGLOBIN (HGB A1C): Hemoglobin A1C: 6.6

## 2014-07-03 MED ORDER — METFORMIN HCL 1000 MG PO TABS: 1000.0000 mg | ORAL_TABLET | Freq: Two times a day (BID) | ORAL | Status: DC

## 2014-07-03 MED ORDER — GLIMEPIRIDE 4 MG PO TABS: 8.0000 mg | ORAL_TABLET | Freq: Every day | ORAL | Status: DC

## 2014-07-03 MED ORDER — LISINOPRIL-HYDROCHLOROTHIAZIDE 20-25 MG PO TABS: 1.0000 | ORAL_TABLET | Freq: Every day | ORAL | Status: DC

## 2014-07-03 MED ORDER — METOPROLOL SUCCINATE ER 100 MG PO TB24: 100.0000 mg | ORAL_TABLET | Freq: Every day | ORAL | Status: DC

## 2014-07-03 NOTE — Progress Notes (Signed)
I recommend [facility]. If there are any issues, we can help set this up. Thanks, VRP.

## 2014-07-03 NOTE — Patient Instructions (Addendum)
Call neurology to get set up for physical therapy.   You will be hearing about a podiatry visit.  Call your eye doctor if you glasses are still making you off balance - you don't want to fall.! Your diabetes is doing great but decrease the amaryl to 1 tab in the morning while you are on this fast.  Try to go without bread and sweats for as long as possible but when you restart them then increase the amaryl back up to 2 tabs daily.    Diabetes and Standards of Medical Care Diabetes is complicated. You may find that your diabetes team includes a dietitian, nurse, diabetes educator, eye doctor, and more. To help everyone know what is going on and to help you get the care you deserve, the following schedule of care was developed to help keep you on track. Below are the tests, exams, vaccines, medicines, education, and plans you will need. HbA1c test This test shows how well you have controlled your glucose over the past 2-3 months. It is used to see if your diabetes management plan needs to be adjusted.   It is performed at least 2 times a year if you are meeting treatment goals.  It is performed 4 times a year if therapy has changed or if you are not meeting treatment goals. Blood pressure test  This test is performed at every routine medical visit. The goal is less than 140/90 mm Hg for most people, but 130/80 mm Hg in some cases. Ask your health care provider about your goal. Dental exam  Follow up with the dentist regularly. Eye exam  If you are diagnosed with type 1 diabetes as a child, get an exam upon reaching the age of 66 years or older and have had diabetes for 3-5 years. Yearly eye exams are recommended after that initial eye exam.  If you are diagnosed with type 1 diabetes as an adult, get an exam within 5 years of diagnosis and then yearly.  If you are diagnosed with type 2 diabetes, get an exam as soon as possible after the diagnosis and then yearly. Foot care exam  Visual foot  exams are performed at every routine medical visit. The exams check for cuts, injuries, or other problems with the feet.  A comprehensive foot exam should be done yearly. This includes visual inspection as well as assessing foot pulses and testing for loss of sensation.  Check your feet nightly for cuts, injuries, or other problems with your feet. Tell your health care provider if anything is not healing. Kidney function test (urine microalbumin)  This test is performed once a year.  Type 1 diabetes: The first test is performed 5 years after diagnosis.  Type 2 diabetes: The first test is performed at the time of diagnosis.  A serum creatinine and estimated glomerular filtration rate (eGFR) test is done once a year to assess the level of chronic kidney disease (CKD), if present. Lipid profile (cholesterol, HDL, LDL, triglycerides)  Performed every 5 years for most people.  The goal for LDL is less than 100 mg/dL. If you are at high risk, the goal is less than 70 mg/dL.  The goal for HDL is 40 mg/dL-50 mg/dL for men and 50 mg/dL-60 mg/dL for women. An HDL cholesterol of 60 mg/dL or higher gives some protection against heart disease.  The goal for triglycerides is less than 150 mg/dL. Influenza vaccine, pneumococcal vaccine, and hepatitis B vaccine  The influenza vaccine is recommended yearly.  It is recommended that people with diabetes who are over 25 years old get the pneumonia vaccine. In some cases, two separate shots may be given. Ask your health care provider if your pneumonia vaccination is up to date.  The hepatitis B vaccine is also recommended for adults with diabetes. Diabetes self-management education  Education is recommended at diagnosis and ongoing as needed. Treatment plan  Your treatment plan is reviewed at every medical visit. Document Released: 07/23/2009 Document Revised: 02/09/2014 Document Reviewed: 02/25/2013 [facility] Patient Information 2015 [place], [place].  This information is not intended to replace advice given to you by your health care provider. Make sure you discuss any questions you have with your health care provider.   Influenza Vaccine (Flu Vaccine, Inactivated or Recombinant) 2014-2015: What You Need to Know 1. Why get vaccinated? Influenza ("flu") is a contagious disease that spreads around the [place] every winter, usually between October and May. Flu is caused by influenza viruses, and is spread mainly by coughing, sneezing, and close contact. Anyone can get flu, but the risk of getting flu is highest among children. Symptoms come on suddenly and may last several days. They can include:  fever/chills  sore throat  muscle aches  fatigue  cough  headache  runny or stuffy nose Flu can make some people much sicker than others. These people include young children, people 51 and older, pregnant women, and people with certain health conditions-such as heart, lung or kidney disease, nervous system disorders, or a weakened immune system. Flu vaccination is especially important for these people, and anyone in close contact with them. Flu can also lead to pneumonia, and make existing medical conditions worse. It can cause diarrhea and seizures in children. Each year thousands of people in the [place] die from flu, and many more are hospitalized. Flu vaccine is the best protection against flu and its complications. Flu vaccine also helps prevent spreading flu from person to person. 2. Inactivated and recombinant flu vaccines You are getting an injectable flu vaccine, which is either an "inactivated" or "recombinant" vaccine. These vaccines do not contain any live influenza virus. They are given by injection with a needle, and often called the "flu shot."  A different live, attenuated (weakened) influenza vaccine is sprayed into the nostrils. This vaccine is described in a separate Vaccine Information Statement. Flu vaccination  is recommended every year. Some children 6 months through 72 years of age might need two doses during one year. Flu viruses are always changing. Each year's flu vaccine is made to protect against 3 or 4 viruses that are likely to cause disease that year. Flu vaccine cannot prevent all cases of flu, but it is the best defense against the disease.  It takes about 2 weeks for protection to develop after the vaccination, and protection lasts several months to a year. Some illnesses that are not caused by influenza virus are often mistaken for flu. Flu vaccine will not prevent these illnesses. It can only prevent influenza. Some inactivated flu vaccine contains a very small amount of a mercury-based preservative called thimerosal. Studies have shown that thimerosal in vaccines is not harmful, but flu vaccines that do not contain a preservative are available. 3. Some people should not get this vaccine Tell the person who gives you the vaccine:  If you have any severe, life-threatening allergies. If you ever had a life-threatening allergic reaction after a dose of flu vaccine, or have a severe allergy to any part of this vaccine, including (  for example) an allergy to gelatin, antibiotics, or eggs, you may be advised not to get vaccinated. Most, but not all, types of flu vaccine contain a small amount of egg protein.  If you ever had Guillain-Barr Syndrome (a severe paralyzing illness, also called GBS). Some people with a history of GBS should not get this vaccine. This should be discussed with your doctor.  If you are not feeling well. It is usually okay to get flu vaccine when you have a mild illness, but you might be advised to wait until you feel better. You should come back when you are better. 4. Risks of a vaccine reaction With a vaccine, like any medicine, there is a chance of side effects. These are usually mild and go away on their own. Problems that could happen after any vaccine:  Brief fainting  spells can happen after any medical procedure, including vaccination. Sitting or lying down for about 15 minutes can help prevent fainting, and injuries caused by a fall. Tell your doctor if you feel dizzy, or have vision changes or ringing in the ears.  Severe shoulder pain and reduced range of motion in the arm where a shot was given can happen, very rarely, after a vaccination.  Severe allergic reactions from a vaccine are very rare, estimated at less than 1 in a million doses. If one were to occur, it would usually be within a few minutes to a few hours after the vaccination. Mild problems following inactivated flu vaccine:  soreness, redness, or swelling where the shot was given  hoarseness  sore, red or itchy eyes  cough  fever  aches  headache  itching  fatigue If these problems occur, they usually begin soon after the shot and last 1 or 2 days. Moderate problems following inactivated flu vaccine:  Young children who get inactivated flu vaccine and pneumococcal vaccine (PCV13) at the same time may be at increased risk for seizures caused by fever. Ask your doctor for more information. Tell your doctor if a child who is getting flu vaccine has ever had a seizure. Inactivated flu vaccine does not contain live flu virus, so you cannot get the flu from this vaccine. As with any medicine, there is a very remote chance of a vaccine causing a serious injury or death. The safety of vaccines is always being monitored. For more information, visit: http://www.aguilar.org/ 5. What if there is a serious reaction? What should I look for?  Look for anything that concerns you, such as signs of a severe allergic reaction, very high fever, or behavior changes. Signs of a severe allergic reaction can include hives, swelling of the face and throat, difficulty breathing, a fast heartbeat, dizziness, and weakness. These would start a few minutes to a few hours after the vaccination. What  should I do?  If you think it is a severe allergic reaction or other emergency that can't wait, call 9-1-1 and get the person to the nearest hospital. Otherwise, call your doctor.  Afterward, the reaction should be reported to the Vaccine Adverse Event Reporting System (VAERS). Your doctor should file this report, or you can do it yourself through the VAERS web site at www.vaers.SamedayNews.es, or by calling 959-424-6485. VAERS does not give medical advice. 6. The National Vaccine Injury Compensation Program The [employer] Vaccine Injury Compensation Program (VICP) is a federal program that was created to compensate people who may have been injured by certain vaccines. Persons who believe they may have been injured by a vaccine  can learn about the program and about filing a claim by calling 941-020-9217 or visiting the [place] website at GoldCloset.com.ee. There is a time limit to file a claim for compensation. 7. How can I learn more?  Ask your health care provider.  Call your local or state health department.  Contact the Centers for Disease Control and Prevention (CDC):  Call 608 467 1430 (1-800-CDC-INFO) or  Visit CDC's website at https://gibson.com/ CDC Vaccine Information Statement (Interim) Inactivated Influenza Vaccine (05/27/2013) Document Released: 07/20/2006 Document Revised: 02/09/2014 Document Reviewed: 09/12/2013 [facility] Patient Information 2015 [place]. This information is not intended to replace advice given to you by your health care provider. Make sure you discuss any questions you have with your health care provider.

## 2014-07-03 NOTE — Progress Notes (Addendum)
Subjective:    Patient ID: Randy Ballard, male    DOB: 1961/02/06, 53 y.o.   MRN: [id number] This chart was scribed for Shawnee Knapp, MD by Cathie Hoops, ED Scribe. The patient was seen in Room 21. The patient's care was started at 8:45 AM.   07/03/2014  Chief Complaint  Patient presents with  . Follow-up    DM  . Medication Refill   Diabetes   HPI Comments: Randy Ballard is a 53 y.o. male who presents to the [facility] for follow-up of diabetes and medication refill.   Diabetes Type II Pt is here for a follow-up visit for his Type 2 Diabetes which is stable. He notes some associated tingling and numbness in his hands and feet bilaterally. He notes he does yard-work to try to stay active.Pt states he checks his blood sugar occasionally but denies regular checks. Pt notes he is doing a 21 day fast with his church in which he eats no bread and no sweets and does not eat after 4pm. He notes this fast will last for three weeks. Pt notes he noticed some vision disturbances with blurriness and "wavy" lines after he took his metformin and drank some water. Pt denies checking his blood sugar during that episode. Pt is compliant with his medications and states he takes two Amaryl tablets in the mornings. He recently went to the opthalmology and received new trifocal glasses which are causing him some mild dizziness and will likely get his prescription changed. Pt will also see podiatry for toenail fungus on his feet bilaterally and would like to look into a specialized treatment offered by his podiatrist.   Blood Pressure Pt checks his BP 2-3x/week with average readings of 130/75 and the lowest BP reading being 125/73. He notes he forgets to eat lunch regularly. Pt denies any recent falls.   He notes he just received his first disability check several days ago. Pt states he will start physical therapy in October and will schedule through his neurologist, Dr. Andrey Spearman. Pt states his wife is doing well and that she will make an appointment with Dr. Brigitte Pulse soon.   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 Just this month he was able to get disability this month, so neurology would like him to start physical therapy as soon as possible. At our last visit 4 months ago, we increased his Amaryl from 4 to 8 mg as his A1C was 7.3, his cholesterol was above goal with an LDL of 111. He was going to work on diet and exercise to adjust cholesterol medication.  Past Medical History  Diagnosis Date  . Diabetes mellitus without complication   . Hyperlipidemia   . Hypertension   . Hypogonadism male   . 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 Diverticulitis   . SCA-3 (spinocerebellar ataxia type 3)    No Known Allergies Current Outpatient Prescriptions on File Prior to Visit  Medication Sig Dispense Refill  . aspirin 81 MG tablet Take 1 tablet (81 mg total) by mouth daily.  30 tablet  11  . atorvastatin (LIPITOR) 40 MG tablet Take 1 tablet (40 mg total) by mouth daily.  30 tablet  5  . Blood Glucose Monitoring Suppl (BLOOD GLUCOSE METER) kit Test blood sugar daily  1 each  0  . glimepiride (AMARYL) 4 MG tablet Take 2 tablets (8 mg total)  by mouth daily with breakfast.  60 tablet  3  . glucose blood test strip Test blood sugar daily.  100 each  3  . Lancet Devices (LANCING DEVICE) MISC Test blood sugar daily.  1 each  0  . Lancets MISC Test blood sugar daily  100 each  3  . levothyroxine (SYNTHROID, LEVOTHROID) 125 MCG tablet take 1 tablet by mouth once daily  90 tablet  1  . lisinopril-hydrochlorothiazide (PRINZIDE,ZESTORETIC) 20-25 MG per tablet Take 1 tablet by mouth daily.  30 tablet  5  . metFORMIN (GLUCOPHAGE) 1000 MG tablet Take 1 tablet (1,000 mg total) by mouth 2 (two) times daily with a meal.  60 tablet  5  . metoprolol succinate (TOPROL-XL) 100 MG 24 hr tablet Take 1 tablet (100  mg total) by mouth daily. Take with or immediately following a meal.  30 tablet  5   No current facility-administered medications on file prior to visit.    Review of Systems  Constitutional: Negative for fever and chills.  Eyes: Positive for visual disturbance.  Respiratory: Negative for shortness of breath.   Gastrointestinal: Negative for nausea and vomiting.  Neurological: Positive for light-headedness.    Objective:  Triage Vitals: BP 142/74  Pulse 77  Temp(Src) 98.2 F (36.8 C)  Resp 16  Ht '5\' 7"'  (1.702 m)  Wt 227 lb (102.967 kg)  BMI 35.55 kg/m2  SpO2 97%  Physical Exam  Nursing note and vitals reviewed. Constitutional: He is oriented to person, place, and time. He appears well-developed and well-nourished. No distress.  HENT:  Head: Normocephalic and atraumatic.  Eyes: Conjunctivae and EOM are normal.  Neck: Neck supple. No tracheal deviation present.  Cardiovascular: Normal rate, regular rhythm, S1 normal, S2 normal and normal heart sounds.  Exam reveals no gallop.   No murmur heard. Pulses:      Dorsalis pedis pulses are 2+ on the right side, and 2+ on the left side.  Pulmonary/Chest: Effort normal and breath sounds normal. No respiratory distress.  Musculoskeletal: Normal range of motion.  No extremity edema.  Neurological: He is alert and oriented to person, place, and time.  Skin: Skin is warm and dry.  Psychiatric: He has a normal mood and affect. His behavior is normal.    Assessment & Plan:  9:00 AM- Patient informed of current plan for treatment and evaluation and agrees with plan at this time.  Type II or unspecified type diabetes mellitus without mention of complication, not stated as uncontrolled - Plan: [facility] Diabetes Foot Exam, POCT glycosylated hemoglobin (Hb A1C), glimepiride (AMARYL) 4 MG tablet, lisinopril-hydrochlorothiazide (PRINZIDE,ZESTORETIC) 20-25 MG per tablet, metFORMIN (GLUCOPHAGE) 1000 MG tablet - Diabetes at much improved control,  unchanged dose except for the next two weeks while fasting, decrease the Amaryl to 4 mg/day from 8 - go back up when fasting no-carb diet is over.   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  Type 2 diabetes mellitus with complication  Encounter for long-term (current) use of other medications - Plan: Comprehensive metabolic panel  Unspecified essential hypertension - Plan: lisinopril-hydrochlorothiazide (PRINZIDE,ZESTORETIC) 20-25 MG  per tablet, metoprolol succinate (TOPROL-XL) 100 MG 24 hr tablet  Onychomycosis - Plan: Ambulatory referral to Podiatry  Type II or unspecified type diabetes mellitus with neurological manifestations, not stated as uncontrolled - Plan: Ambulatory referral to Podiatry  Meds ordered this encounter  Medications  . glimepiride (AMARYL) 4 MG tablet    Sig: Take 2 tablets (8 mg total) by mouth daily with breakfast.    Dispense:  60 tablet    Refill:  5  . lisinopril-hydrochlorothiazide (PRINZIDE,ZESTORETIC) 20-25 MG per tablet    Sig: Take 1 tablet by mouth daily.    Dispense:  30 tablet    Refill:  5  . metFORMIN (GLUCOPHAGE) 1000 MG tablet    Sig: Take 1 tablet (1,000 mg total) by mouth 2 (two) times daily with a meal.    Dispense:  60 tablet    Refill:  5  . metoprolol succinate (TOPROL-XL) 100 MG 24 hr tablet    Sig: Take 1 tablet (100 mg total) by mouth daily. Take with or immediately following a meal.    Dispense:  30 tablet    Refill:  5    I personally performed the services described in this  documentation, which was scribed in my presence. The recorded information has been reviewed and considered, and addended by me as needed.  [name], MD MPH

## 2014-08-02 MED ORDER — LEVOTHYROXINE SODIUM 125 MCG PO TABS: 125.0000 ug | ORAL_TABLET | Freq: Every day | ORAL | Status: DC

## 2014-08-02 MED ORDER — ATORVASTATIN CALCIUM 40 MG PO TABS: 40.0000 mg | ORAL_TABLET | Freq: Every day | ORAL | Status: DC

## 2014-09-15 ENCOUNTER — Ambulatory Visit: Payer: Federal, State, Local not specified - PPO | Attending: Family Medicine

## 2014-09-15 DIAGNOSIS — R269 Unspecified abnormalities of gait and mobility: Secondary | ICD-10-CM | POA: Diagnosis present

## 2014-09-15 DIAGNOSIS — R279 Unspecified lack of coordination: Secondary | ICD-10-CM

## 2014-09-15 DIAGNOSIS — M6281 Muscle weakness (generalized): Secondary | ICD-10-CM | POA: Diagnosis not present

## 2014-09-16 ENCOUNTER — Telehealth: Payer: Self-pay

## 2014-09-16 NOTE — Telephone Encounter (Signed)
Dr. Clelia CroftShaw~  I saw Mr. Randy Ballard on 09/15/14, per your PT referral for spinocerebellar ataxia type III. I feel that he might benefit from an OT eval, as pt reported he has difficulty dressing and performing ADLs due to B hand weakness. If you agree, can you please send us an electronic referral for an OT eval.  Thank you,  Zerita BoersJennifer Latonga Ponder, PT, DPT

## 2014-09-16 NOTE — Therapy (Signed)
[facility] [address] [place], [place], [address] Phone: [phone]   Fax:  [phone]  Physical Therapy Evaluation  Patient Details  Name: Randy Ballard MRN: [id number] Date of Birth: Dec 19, 1960  Encounter Date: 09/15/2014      PT End of Session - 09/16/14 0931    Visit Number 1   Number of Visits 17   Authorization Type BCBS   PT Start Time 1025   PT Stop Time 1100   PT Time Calculation (min) 35 min   Equipment Utilized During Treatment Gait belt   Activity Tolerance Patient tolerated treatment well   Behavior [facility] Therapy [facility] for [facility] assessed/performed    Pt arrived 10 minutes to appt.  Past Medical History  Diagnosis Date  . Diabetes mellitus without complication   . Hyperlipidemia   . Hypertension   . Hypogonadism male   . 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 Diverticulitis   . SCA-3 (spinocerebellar ataxia type 3)     Past Surgical History  Procedure Laterality Date  . Biopsy thyroid  02/06/2009    cold thyroid nodule; negative/benign.  . Colonoscopy w/ polypectomy  Feb 2014    5 polyps    There were no vitals taken for this visit.  Visit Diagnosis:  Abnormality of gait - Plan: PT plan of care cert/re-cert  Lack of coordination - Plan: PT plan of care cert/re-cert  Muscle weakness (generalized) - Plan: PT plan of care cert/re-cert      Subjective Assessment - 09/15/14 1034    Symptoms Impaired balance, difficulty walking, N/T in hands in feet, slurred speech (occasionally)   Pertinent History bone spur which compresses sciatic nerve, diabetes, and falls   Patient Stated Goals improve balance, no falls during ambulation   Currently in Pain? No/denies          [facility] Assessment - 09/15/14 1038    Precautions   Precautions Fall   Balance Screen   Has the patient fallen in the past 6 months Yes   How many times? 2   Has the patient had a  decrease in activity level because of a fear of falling?  Yes   Is the patient reluctant to leave their home because of a fear of falling?  No   Home Environment   Living Enviornment Private residence   Living Arrangements Spouse/significant other   Available Help at Discharge Family   Type of Home House   Home Access Level entry   Home Layout One level   Home Equipment [place] - single point;Walker - 4 wheels   Prior Function   Level of Independence Independent with basic ADLs;Independent with homemaking with ambulation;Independent with gait;Independent with transfers   Vocation On disability  Used to work for [employer].   Leisure [place] work, house work, Tax adviserguitar (bass) Engineer, maintenance (IT)player   Cognition   Memory Impaired   Memory Impairment Decreased short term memory   Sensation   Light Touch Impaired by gross assessment   Additional Comments Decreased L ankle sensation.   Coordination   Gross Motor Movements are Fluid and Coordinated Yes   Fine Motor Movements are Fluid and Coordinated Yes  decr. finger to thumb coordination and speed   AROM   Overall AROM  Within functional limits for tasks performed   Strength   Overall Strength Deficits   Overall Strength Comments B LE globally 4+/5, except for B dorsiflexion, knee flex and hip flex:  3+/5.    Transfers   Transfers Sit to Stand;Stand to Sit   Sit to Stand 5: Supervision;With upper extremity assist;From chair/3-in-1   Sit to Stand Details (indicate cue type and reason) Supervision to ensure safety.   Stand to Sit 5: Supervision;With armrests;To chair/3-in-1   Stand to Sit Details Supervision to ensure safety.   Ambulation/Gait   Ambulation/Gait Yes   Ambulation/Gait Assistance 5: Supervision;4: Min guard   Ambulation/Gait Assistance Details Pt ambulated over even terrain with SPC, with no LOB episodes.   Ambulation Distance (Feet) 100 Feet   Assistive device Straight cane   Gait Pattern Step-through pattern;Decreased stride length;Decreased  dorsiflexion - right;Decreased dorsiflexion - left;Wide base of support;Decreased trunk rotation   Gait velocity 1.53ft/sec  : 20.75sec.   Balance   Balance Assessed --   Standardized Balance Assessment   Standardized Balance Assessment Timed Up and Go Test   Timed Up and Go Test   TUG Normal TUG   Normal TUG (seconds) 22.88              PT Short Term Goals - 09/16/14 0935    PT SHORT TERM GOAL #1   Title Pt will be independent in HEP to improve functional mobility. Target date: 10/14/14.   Status New   PT SHORT TERM GOAL #2   Title Pt will perform TUG with LRAD in </=18 seconds, to decrease falls risk. Target date: 10/14/14.   Status New   PT SHORT TERM GOAL #3   Title Pt will improve gait speed to >/=2.31ft/sec., with LRAD, to improve functional mobility. Target date: 10/14/14.   Status New   PT SHORT TERM GOAL #4   Title Pt will be able to ambulate 300' , with LRAD, and supevision over even/uneven terrain to improve functional mobility. Target date: 10/14/14.   Status New   PT SHORT TERM GOAL #5   Title Assess BERG and write appropriate STG and LTG goal. Target date: 10/14/14.   Status New          PT Long Term Goals - 09/16/14 1610    PT LONG TERM GOAL #1   Title Pt will verbalize understanding and agreement of falls prevention strategies. Target date: 11/11/14.   Status New   PT LONG TERM GOAL #2   Title Pt will perform TUG with LRAD in </=13.5 seconds, to decrease falls risk. Target date: 11/11/14.   Status New   PT LONG TERM GOAL #3   Title Pt will improve gait speed to >/=2.79ft/sec., with LRAD, to improve functional mobility. Target date: 11/11/14.   Status New   PT LONG TERM GOAL #4   Title Pt will verbalize understanding joining gym upon PT d/c, to maintain strength and endurance gains. Target date: 11/11/14.   PT LONG TERM GOAL #5   Title Pt will improve ABC score by 10% to improve quality of life. Target date: 11/11/14.   Status New   Additional Long Term Goals    Additional Long Term Goals Yes   PT LONG TERM GOAL #6   Title Pt will ambulate 500', with LRAD, at MOD I level over even/uneven terrain to improve functional mobility. Target date: 11/11/14.   PT LONG TERM GOAL #7   Status New          Plan - 09/15/14 1030    Clinical Impression Statement Pt is a pleasant 54 y/o male presenting to OPPT neuro with spinocerebellar ataxia type III; with pt reporting impaired balance and decreased strength.  Pt reported he was diagnosed with ataxia last 10/2013, and quit work in 11/2103 due to difficulty performing work duties due to ataxia.  Pt reported slurred speech, muscle weakness (especially in fingers), N/T in hands and feet, impaired balance and difficulty walking.  All symptoms have been getting progressively worse. Pt reported 2 falls in the last six months. Pt reported he has difficulty with ADLs due to decreased ability to use fingers, pt would benefit from OT eval to address ADLs.   Pt will benefit from skilled therapeutic intervention in order to improve on the following deficits Abnormal gait;Decreased coordination;Impaired flexibility;Decreased endurance;Impaired sensation;Decreased balance;Decreased strength;Decreased mobility;Impaired UE functional use   Rehab Potential Good   PT Frequency 2x / week   PT Duration 8 weeks   PT Treatment/Interventions ADLs/Self Care Home Management;Gait training;Neuromuscular re-education;Functional mobility training;Patient/family education;Therapeutic activities;Therapeutic exercise;Electrical Stimulation;DME Instruction;Balance training;Manual techniques   PT Next Visit Plan Perform BERG, initiate balance/strengthening HEP.   Recommended Other Services Yes, OT eval to address difficulty performing ADLs with B UEs.   Consulted and Agree with Plan of Care Patient                              Problem List Patient Active Problem List   Diagnosis Date Noted  . SCA-3 (spinocerebellar ataxia type  3) 12/17/2013  . Acute pancreatitis 04/16/2013  . Leukocytosis 04/16/2013  . BMI 34.0-34.9,adult 02/05/2012  . HTN (hypertension) 12/23/2011  . DM type 2 (diabetes mellitus, type 2) 12/23/2011  . Hypothyroid 12/23/2011  . Dyslipidemia 12/23/2011    Zakee Deerman L 09/16/2014, 9:51 AM      Zerita BoersJennifer Julita Ozbun, PT,DPT 09/16/2014 9:51 AM Phone: [phone] Fax: [phone]

## 2014-09-21 ENCOUNTER — Ambulatory Visit: Payer: Federal, State, Local not specified - PPO

## 2014-09-21 DIAGNOSIS — M6281 Muscle weakness (generalized): Secondary | ICD-10-CM

## 2014-09-21 DIAGNOSIS — R269 Unspecified abnormalities of gait and mobility: Secondary | ICD-10-CM | POA: Diagnosis not present

## 2014-09-21 DIAGNOSIS — R279 Unspecified lack of coordination: Secondary | ICD-10-CM

## 2014-09-21 NOTE — Therapy (Signed)
[facility] [address] [place], [place], [address] Phone: [phone]   Fax:  [phone]  Physical Therapy Treatment  Patient Details  Name: Randy Ballard MRN: [id number] Date of Birth: [birth year]  Encounter Date: 09/21/2014      PT End of Session - 09/21/14 1311    Visit Number 2   Number of Visits 17   Authorization Type BCBS   PT Start Time 1100   PT Stop Time 1143   PT Time Calculation (min) 43 min   Equipment Utilized During Treatment Gait belt   Activity Tolerance Patient tolerated treatment well   Behavior During Therapy [facility] for tasks assessed/performed      Past Medical History  Diagnosis Date  . Diabetes mellitus without complication   . Hyperlipidemia   . Hypertension   . Hypogonadism male   . 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 Diverticulitis   . SCA-3 (spinocerebellar ataxia type 3)     Past Surgical History  Procedure Laterality Date  . Biopsy thyroid  02/06/2009    cold thyroid nodule; negative/benign.  . Colonoscopy w/ polypectomy  Feb 2014    5 polyps    There were no vitals taken for this visit.  Visit Diagnosis:  Abnormality of gait  Lack of coordination  Muscle weakness (generalized)      Subjective Assessment - 09/21/14 1102    Symptoms "I've been doing good, no falls or anything."   Patient Stated Goals improve balance, no falls during ambulation   Currently in Pain? No/denies            [facility] Adult PT Treatment/Exercise - 09/21/14 1102    Balance   Balance Assessed Yes   Static Standing Balance   Static Standing - Balance Support No upper extremity supported   Static Standing - Level of Assistance 5: Stand by assistance;Other (comment)  min guard   Static Standing - Comment/# of Minutes Performed in corner with chair in front of pt for safety with B LE, for 10-30 seconds x1-2sets. Feet apart/together with and without head  turns, feet apart with eyes closed, modified tandem, single leg stance with one UE support. VC's to improve weight shifitng. Pt required min A during two LOB episodes and use hands to self correct 4 LOB episodes.   Standardized Balance Assessment   Standardized Balance Assessment Berg Balance Test   Berg Balance Test   Sit to Stand Able to stand  independently using hands   Standing Unsupported Able to stand 2 minutes with supervision   Sitting with Back Unsupported but Feet Supported on Floor or Stool Able to sit 2 minutes under supervision   Stand to Sit Controls descent by using hands   Transfers Able to transfer safely, definite need of hands   Standing Unsupported with Eyes Closed Able to stand 10 seconds with supervision   Standing Ubsupported with Feet Together Able to place feet together independently and stand for 1 minute with supervision   From Standing, Reach Forward with Outstretched Arm Can reach forward >5 cm safely (2")  4"   From Standing Position, Pick up Object from Floor Able to pick up shoe, needs supervision   From Standing Position, Turn to Look Behind Over each Shoulder Looks behind one side only/other side shows less weight shift   Turn 360 Degrees Needs close supervision or verbal cueing   Standing Unsupported, Alternately Place Feet on Step/Stool Able to complete >  2 steps/needs minimal assist   Standing Unsupported, One Foot in Front Able to take small step independently and hold 30 seconds   Standing on One Leg Tries to lift leg/unable to hold 3 seconds but remains standing independently  2 seconds standing on R LE.   Total Score 34          PT Education - 09/21/14 1310    Education provided Yes   Education Details Balance HEP and educated on using rollator at all times, due to 34/56 BERG score. Pt did bring cane to PT today. PT asked pt to bring his cane and rollator to next visit.   Person(s) Educated Patient   Methods Explanation;Demonstration;Handout    Comprehension Verbalized understanding;Returned demonstration          PT Short Term Goals - 09/21/14 1312    PT SHORT TERM GOAL #1   Title Pt will be independent in HEP to improve functional mobility. Target date: 10/14/14.   Status On-going   PT SHORT TERM GOAL #2   Title Pt will perform TUG with LRAD in </=18 seconds, to decrease falls risk. Target date: 10/14/14.   Status On-going   PT SHORT TERM GOAL #3   Title Pt will improve gait speed to >/=2.430ft/sec., with LRAD, to improve functional mobility. Target date: 10/14/14.   Status On-going   PT SHORT TERM GOAL #4   Title Pt will be able to ambulate 300' , with LRAD, and supevision over even/uneven terrain to improve functional mobility. Target date: 10/14/14.   Status On-going   PT SHORT TERM GOAL #5   Title Assess BERG and write appropriate STG and LTG goal. Target date: 10/14/14.   Status Achieved   Additional Short Term Goals   Additional Short Term Goals Yes   PT SHORT TERM GOAL #6   Title Pt will improve BERG balance score to >/=38/56 to decrease risk of falls. Target date: 10/14/14.   Status New          PT Long Term Goals - 09/21/14 1313    PT LONG TERM GOAL #1   Title Pt will verbalize understanding and agreement of falls prevention strategies. Target date: 11/11/14.   Status On-going   PT LONG TERM GOAL #2   Title Pt will perform TUG with LRAD in </=13.5 seconds, to decrease falls risk. Target date: 11/11/14.   Status On-going   PT LONG TERM GOAL #3   Title Pt will improve gait speed to >/=2.624ft/sec., with LRAD, to improve functional mobility. Target date: 11/11/14.   Status On-going   PT LONG TERM GOAL #4   Title Pt will verbalize understanding joining gym upon PT d/c, to maintain strength and endurance gains. Target date: 11/11/14.   Status On-going   PT LONG TERM GOAL #5   Title Pt will improve ABC score by 10% to improve quality of life. Target date: 11/11/14.   Status On-going   PT LONG TERM GOAL #6   Title Pt will  ambulate 500', with LRAD, at MOD I level over even/uneven terrain to improve functional mobility. Target date: 11/11/14.   Status On-going   PT LONG TERM GOAL #7   Title Pt will improve BERG balance score to >/=42/56 to decrease risk of falls. Target date: 11/11/14.   Status New          Plan - 09/21/14 1311    Clinical Impression Statement Pt scored 34/56 on BERG, indicating he's at a high risk for falls. Pt tolerated static  standing HEP well. Pt would continue to benefit from skilled therapy to improve safety during functional mobility.   Pt will benefit from skilled therapeutic intervention in order to improve on the following deficits Abnormal gait;Decreased coordination;Impaired flexibility;Decreased endurance;Impaired sensation;Decreased balance;Decreased strength;Decreased mobility;Impaired UE functional use   Rehab Potential Good   PT Frequency 2x / week   PT Duration 8 weeks   PT Treatment/Interventions ADLs/Self Care Home Management;Gait training;Neuromuscular re-education;Functional mobility training;Patient/family education;Therapeutic activities;Therapeutic exercise;Electrical Stimulation;DME Instruction;Balance training;Manual techniques   PT Next Visit Plan Assess gait with rollator, RW and cane; initiate dynamic gait traning and strengthening HEP.   Consulted and Agree with Plan of Care Patient                               Problem List Patient Active Problem List   Diagnosis Date Noted  . SCA-3 (spinocerebellar ataxia type 3) 12/17/2013  . Acute pancreatitis 04/16/2013  . Leukocytosis 04/16/2013  . BMI 34.0-34.9,adult 02/05/2012  . HTN (hypertension) 12/23/2011  . DM type 2 (diabetes mellitus, type 2) 12/23/2011  . Hypothyroid 12/23/2011  . Dyslipidemia 12/23/2011    Locke Barrell L 09/21/2014, 1:14 PM     Zerita BoersJennifer Anvay Tennis, PT,DPT 09/21/2014 1:14 PM Phone: [phone] Fax: [phone]

## 2014-09-21 NOTE — Patient Instructions (Signed)
Perform all activities in the corner with a chair in front of you for safety.  Feet Apart, Head Motion - Eyes Open   With eyes open, feet apart, move head slowly: up and down, side to side, and in diagonally (corner to corner) for 30 seconds. Repeat _3___ times per session. Do __1__ sessions per day.  Copyright  VHI. All rights reserved.  Feet Apart, Arm Motion - Eyes Closed   With eyes closed and feet shoulder with arms at your side and hold for 10-30 seconds. Repeat _3___ times per session. Do _1___ sessions per day.  Copyright  VHI. All rights reserved.  Feet Partial Heel-Toe, Varied Arm Positions - Eyes Open   With eyes open, right foot partially in front of the other, arms at your side, look straight ahead at a stationary object. Hold _30___ seconds. Repeat _3___ times per session. Do __1__ sessions per day.  Copyright  VHI. All rights reserved.  Feet Together, Varied Arm Positions - Eyes Open   With eyes open, feet together, arms at your side, look straight ahead at a stationary object. Hold _30___ seconds. Repeat _3___ times per session. Do __1__ sessions per day.  Copyright  VHI. All rights reserved.  Single Leg - Eyes Open   Holding support, lift right leg while maintaining balance over other leg. Progress to removing hands from support surface for longer periods of time. Hold__10-30__ seconds. Repeat with left leg lifted up while balancing on right leg. Repeat _3___ times per session per leg. Do _1___ sessions per day.  Copyright  VHI. All rights reserved.

## 2014-09-23 ENCOUNTER — Ambulatory Visit: Payer: Federal, State, Local not specified - PPO

## 2014-09-23 DIAGNOSIS — R279 Unspecified lack of coordination: Secondary | ICD-10-CM

## 2014-09-23 DIAGNOSIS — M6281 Muscle weakness (generalized): Secondary | ICD-10-CM

## 2014-09-23 DIAGNOSIS — R269 Unspecified abnormalities of gait and mobility: Secondary | ICD-10-CM | POA: Diagnosis not present

## 2014-09-23 NOTE — Therapy (Addendum)
[facility] [address] [place], [place], [address] Phone: [phone]   Fax:  [phone]  Physical Therapy Treatment  Patient Details  Name: Randy Ballard R Gamero MRN: [id number] Date of Birth: 25-May-1961  Encounter Date: 09/23/2014      PT End of Session - 09/23/14 1154    Visit Number 3   Number of Visits 17   Authorization Type BCBS   PT Start Time 1102   PT Stop Time 1144   PT Time Calculation (min) 42 min   Equipment Utilized During Treatment Gait belt   Activity Tolerance Patient tolerated treatment well   Behavior During Therapy [facility] for tasks assessed/performed      Past Medical History  Diagnosis Date  . Diabetes mellitus without complication   . Hyperlipidemia   . Hypertension   . Hypogonadism male   . 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 Diverticulitis   . SCA-3 (spinocerebellar ataxia type 3)     Past Surgical History  Procedure Laterality Date  . Biopsy thyroid  02/06/2009    cold thyroid nodule; negative/benign.  . Colonoscopy w/ polypectomy  Feb 2014    5 polyps    There were no vitals taken for this visit.  Visit Diagnosis:  Abnormality of gait  Lack of coordination  Muscle weakness (generalized)      Subjective Assessment - 09/23/14 1106    Symptoms Pt denied any falls or changes since last visit. Pt reported he's doing well.   Pertinent History bone spur which compresses sciatic nerve, diabetes, and falls   Patient Stated Goals improve balance, no falls during ambulation   Currently in Pain? No/denies            [facility]/Exercise - 09/23/14 1107    Transfers   Transfers Sit to Stand;Stand to Sit   Sit to Stand 5: Supervision;With upper extremity assist;From chair/3-in-1   Sit to Stand Details (indicate cue type and reason) x5. vc's for hand placement and sequencing.   Stand to Sit 5: Supervision;With armrests;To chair/3-in-1    Stand to Sit Details x5. vc's for sequencing.   Ambulation/Gait   Ambulation/Gait Yes   Ambulation/Gait Assistance 5: Supervision   Ambulation/Gait Assistance Details Pt ambulated over even terrain while performing head turns with rollator. Pt noted to ambulate with improved heel strike, stride length, improved BOS vs. wide BOS with rollator vs. cane. Pt required seated rest break after ambulation due to fatigue.   Ambulation Distance (Feet) --  75', 230'   Assistive device Rollator   Gait Pattern Step-through pattern;Decreased stride length;Decreased dorsiflexion - right;Decreased dorsiflexion - left;Wide base of support;Decreased trunk rotation   Gait velocity 2.5979ft/sec.  with rollator   Balance   Balance Assessed Yes   Dynamic Standing Balance   Dynamic Standing - Balance Support --  1-2 UE support   Dynamic Standing - Level of Assistance 5: Stand by assistance;Other (comment)  min guard   Dynamic Standing - Balance Activities Forward lean/weight shifting;Other (comment)   Dynamic Standing - Comments Pt performed, ant/post. weight shifting (x20/direction), sidestepping and marching with one UE support at counter. VC's to improve knee flexion during marching and to keep hips forward during sidestepping. (4x7'/activity), decr. trunk flexion during posterior weight shifting, and to improve upright posture.    Exercises   Exercises Knee/Hip          PT Education - 09/23/14 1152    Education provided Yes  Education Details Dynamic balance/strengthing HEP. PT educated pt on using rollator when ambulating in the community and [facility] at home only to improve safety and reduce risk of falls.   Person(s) Educated Patient   Methods Explanation   Comprehension Verbalized understanding          PT Short Term Goals - 09/23/14 1201    PT SHORT TERM GOAL #1   Title Pt will be independent in HEP to improve functional mobility. Target date: 10/14/14.   Status On-going   PT SHORT TERM GOAL  #2   Title Pt will perform TUG with LRAD in </=18 seconds, to decrease falls risk. Target date: 10/14/14.   Status On-going   PT SHORT TERM GOAL #3   Title Pt will improve gait speed to >/=2.41ft/sec., with LRAD, to improve functional mobility. Target date: 10/14/14.   Status On-going   PT SHORT TERM GOAL #4   Title Pt will be able to ambulate 300' , with LRAD, and supevision over even/uneven terrain to improve functional mobility. Target date: 10/14/14.   Status On-going   PT SHORT TERM GOAL #5   Title Assess BERG and write appropriate STG and LTG goal. Target date: 10/14/14.   Status Achieved   PT SHORT TERM GOAL #6   Title Pt will improve BERG balance score to >/=38/56 to decrease risk of falls. Target date: 10/14/14.   Status New          PT Long Term Goals - 09/23/14 1201    PT LONG TERM GOAL #1   Title Pt will verbalize understanding and agreement of falls prevention strategies. Target date: 11/11/14.   Status On-going   PT LONG TERM GOAL #2   Title Pt will perform TUG with LRAD in </=13.5 seconds, to decrease falls risk. Target date: 11/11/14.   Status On-going   PT LONG TERM GOAL #3   Title Pt will improve gait speed to >/=2.13ft/sec., with LRAD, to improve functional mobility. Target date: 11/11/14.   Status On-going   PT LONG TERM GOAL #4   Title Pt will verbalize understanding joining gym upon PT d/c, to maintain strength and endurance gains. Target date: 11/11/14.   Status On-going   PT LONG TERM GOAL #5   Title Pt will improve ABC score by 10% to improve quality of life. Target date: 11/11/14.   Status On-going   PT LONG TERM GOAL #6   Title Pt will ambulate 500', with LRAD, at MOD I level over even/uneven terrain to improve functional mobility. Target date: 11/11/14.   Status On-going   PT LONG TERM GOAL #7   Title Pt will improve BERG balance score to >/=42/56 to decrease risk of falls. Target date: 11/11/14.   Status New          Plan - 09/23/14 1157    Clinical Impression  Statement Pt ambulated in a safer and fast manner with rollator vs. SPC, as gait velocity increased to 1.25ft/sec [facility]) to 2.62ft/sec with rollator. Pt continues to experience decr. stride length and heel strike after ambulating longer distances. Continue with POC.   Pt will benefit from skilled therapeutic intervention in order to improve on the following deficits Abnormal gait;Decreased coordination;Impaired flexibility;Decreased endurance;Impaired sensation;Decreased balance;Decreased strength;Decreased mobility;Impaired UE functional use   Rehab Potential Good   PT Frequency 2x / week   PT Duration 8 weeks   PT Treatment/Interventions ADLs/Self Care Home Management;Gait training;Neuromuscular re-education;Functional mobility training;Patient/family education;Therapeutic activities;Therapeutic exercise;Electrical Stimulation;DME Instruction;Balance training;Manual techniques   PT Next Visit  Plan Provide hip abd strengthening and hamstring stretch. Dynamic gait training with rollator.   Consulted and Agree with Plan of Care Patient                               Problem List Patient Active Problem List   Diagnosis Date Noted  . SCA-3 (spinocerebellar ataxia type 3) 12/17/2013  . Acute pancreatitis 04/16/2013  . Leukocytosis 04/16/2013  . BMI 34.0-34.9,adult 02/05/2012  . HTN (hypertension) 12/23/2011  . DM type 2 (diabetes mellitus, type 2) 12/23/2011  . Hypothyroid 12/23/2011  . Dyslipidemia 12/23/2011    Kaoru Benda L 09/23/2014, 12:05 PM     Zerita BoersJennifer Kaegan Stigler, PT,DPT 09/23/2014 12:05 PM Phone: [phone] Fax: [phone]

## 2014-09-23 NOTE — Patient Instructions (Signed)
Hold onto counter with 1-2 hands for safety.  Weight Shift: Anterior / Posterior (Limits of Stability)   Slowly shift weight backward until toes begin to rise off floor, hold onto sink. Return to starting position. Shift weight slowly forward until heels begin to rise off floor. Hold each position __2__ seconds. Repeat __20__ times per session. Do _1___ sessions per day.  Copyright  VHI. All rights reserved.  Side-Stepping   Walk to left side with eyes open. Take even steps, leading with same foot. Make sure each foot lifts off the floor. Repeat in opposite direction. Repeat x4 length of counter. Do __1__ sessions per day.   Copyright  VHI. All rights reserved.  "I love a Emergency planning/management officerarade" Lift   Hold onto counter, march along counter, lifting knees to 90 degrees. Repeat __4__ times. Do __1__ sessions per day.  http://gt2.exer.us/344   Copyright  VHI. All rights reserved.

## 2014-09-28 ENCOUNTER — Ambulatory Visit: Payer: Federal, State, Local not specified - PPO

## 2014-09-28 DIAGNOSIS — R269 Unspecified abnormalities of gait and mobility: Secondary | ICD-10-CM | POA: Diagnosis not present

## 2014-09-28 DIAGNOSIS — M6281 Muscle weakness (generalized): Secondary | ICD-10-CM

## 2014-09-28 DIAGNOSIS — R279 Unspecified lack of coordination: Secondary | ICD-10-CM

## 2014-09-28 NOTE — Therapy (Signed)
[facility] [address] [place], [place], [address] Phone: [phone]   Fax:  [phone]  Physical Therapy Treatment  Patient Details  Name: Randy Ballard MRN: [id number] Date of Birth: [birth year]  Encounter Date: 09/28/2014      PT End of Session - 09/28/14 1309    Visit Number 4   Number of Visits 17   Authorization Type BCBS   PT Start Time 1104   PT Stop Time 1144   PT Time Calculation (min) 40 min   Equipment Utilized During Treatment Gait belt   Activity Tolerance Patient tolerated treatment well   Behavior During Therapy [facility] for tasks assessed/performed      Past Medical History  Diagnosis Date  . Diabetes mellitus without complication   . Hyperlipidemia   . Hypertension   . Hypogonadism male   . 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 Diverticulitis   . SCA-3 (spinocerebellar ataxia type 3)     Past Surgical History  Procedure Laterality Date  . Biopsy thyroid  02/06/2009    cold thyroid nodule; negative/benign.  . Colonoscopy w/ polypectomy  Feb 2014    5 polyps    There were no vitals taken for this visit.  Visit Diagnosis:  Abnormality of gait  Lack of coordination  Muscle weakness (generalized)      Subjective Assessment - 09/28/14 1106    Symptoms Pt arrived 4 minutes late. Pt denied any falls or changes since last visit.    Pertinent History bone spur which compresses sciatic nerve, diabetes, and falls   Patient Stated Goals improve balance, no falls during ambulation   Currently in Pain? Yes   Pain Score 4    Pain Location Ankle   Pain Orientation Left   Pain Descriptors / Indicators Aching   Pain Type Chronic pain   Pain Onset More than a month ago   Pain Frequency Intermittent   Aggravating Factors  Pain is worse in the morning   Pain Relieving Factors moving around                    [facility]  Treatment/Exercise - 09/28/14 1108    Ambulation/Gait   Ambulation/Gait Yes   Ambulation/Gait Assistance 5: Supervision;Other (comment)  min guard   Ambulation/Gait Assistance Details Pt ambulated over even terrain while performing head turns and figure eights with and without rollator. VC's to improve B heel strike, stride length, and upright posture. Pt required seated rest breaks during ambulation due to fatigue.   Ambulation Distance (Feet) --  230'x2, 115'x2, 75'   Assistive device Rollator;None   Gait Pattern Step-through pattern;Decreased stride length;Decreased dorsiflexion - right;Decreased dorsiflexion - left;Wide base of support;Decreased trunk rotation   Exercises   Exercises Knee/Hip   Knee/Hip Exercises: Stretches   Active Hamstring Stretch 3 reps;30 seconds   Active Hamstring Stretch Limitations B LE stretch, VC's and demonstration for technique and to keep back straight.   Knee/Hip Exercises: Sidelying   Clams B LE sidelying clamshells, 3x10. VC's for technique and to keep hips forward. Pt required rest breaks between each set due to fatigue.                PT Education - 09/28/14 1309    Education provided Yes   Education Details HEP   Person(s) Educated Patient   Methods Explanation;Demonstration;Verbal cues;Handout   Comprehension Returned demonstration;Verbalized understanding  PT Short Term Goals - 09/28/14 1311    PT SHORT TERM GOAL #1   Title Pt will be independent in HEP to improve functional mobility. Target date: 10/14/14.   Status On-going   PT SHORT TERM GOAL #2   Title Pt will perform TUG with LRAD in </=18 seconds, to decrease falls risk. Target date: 10/14/14.   Status On-going   PT SHORT TERM GOAL #3   Title Pt will improve gait speed to >/=2.340ft/sec., with LRAD, to improve functional mobility. Target date: 10/14/14.   Status On-going   PT SHORT TERM GOAL #4   Title Pt will be able to ambulate 300' , with LRAD, and supevision over  even/uneven terrain to improve functional mobility. Target date: 10/14/14.   Status On-going   PT SHORT TERM GOAL #5   Title Assess BERG and write appropriate STG and LTG goal. Target date: 10/14/14.   Status Achieved   PT SHORT TERM GOAL #6   Title Pt will improve BERG balance score to >/=38/56 to decrease risk of falls. Target date: 10/14/14.   Status New           PT Long Term Goals - 09/28/14 1311    PT LONG TERM GOAL #1   Title Pt will verbalize understanding and agreement of falls prevention strategies. Target date: 11/11/14.   Status On-going   PT LONG TERM GOAL #2   Title Pt will perform TUG with LRAD in </=13.5 seconds, to decrease falls risk. Target date: 11/11/14.   Status On-going   PT LONG TERM GOAL #3   Title Pt will improve gait speed to >/=2.594ft/sec., with LRAD, to improve functional mobility. Target date: 11/11/14.   Status On-going   PT LONG TERM GOAL #4   Title Pt will verbalize understanding joining gym upon PT d/c, to maintain strength and endurance gains. Target date: 11/11/14.   Status On-going   PT LONG TERM GOAL #5   Title Pt will improve ABC score by 10% to improve quality of life. Target date: 11/11/14.   Status On-going   PT LONG TERM GOAL #6   Title Pt will ambulate 500', with LRAD, at MOD I level over even/uneven terrain to improve functional mobility. Target date: 11/11/14.   Status On-going   PT LONG TERM GOAL #7   Title Pt will improve BERG balance score to >/=42/56 to decrease risk of falls. Target date: 11/11/14.   Status New               Plan - 09/28/14 1310    Clinical Impression Statement Pt demonstrated progress, as he tolerated new HEP well and ambulated with and without rollator without LOB. Pt continues to quickly fatigue during session, which causes pt to require rest breaks and decrease heel strike during ambulaiton. Continue with POC.   Pt will benefit from skilled therapeutic intervention in order to improve on the following deficits Abnormal  gait;Decreased coordination;Impaired flexibility;Decreased endurance;Impaired sensation;Decreased balance;Decreased strength;Decreased mobility;Impaired UE functional use   Rehab Potential Good   PT Frequency 2x / week   PT Duration 8 weeks   PT Treatment/Interventions ADLs/Self Care Home Management;Gait training;Neuromuscular re-education;Functional mobility training;Patient/family education;Therapeutic activities;Therapeutic exercise;Electrical Stimulation;DME Instruction;Balance training;Manual techniques   PT Next Visit Plan Dynamic gait training with and without rollator, dynamic balance activities. Check to see if Dr. Clelia CroftShaw sent OT referral to our clinic.   Consulted and Agree with Plan of Care Patient        Problem List Patient Active Problem List  Diagnosis Date Noted  . SCA-3 (spinocerebellar ataxia type 3) 12/17/2013  . Acute pancreatitis 04/16/2013  . Leukocytosis 04/16/2013  . BMI 34.0-34.9,adult 02/05/2012  . HTN (hypertension) 12/23/2011  . DM type 2 (diabetes mellitus, type 2) 12/23/2011  . Hypothyroid 12/23/2011  . Dyslipidemia 12/23/2011    Aniesa Boback L 09/28/2014, 1:12 PM  [facility] [address] [place], [place], [address] Phone: [phone]   Fax:  [phone]     Zerita Boers, PT,DPT 09/28/2014 1:13 PM Phone: [phone] Fax: [phone]

## 2014-09-28 NOTE — Patient Instructions (Signed)
Abduction: Clam (Eccentric) - Side-Lying   Lie on right side with knees bent. Lift top knee up towards ceiling while keeping feet together. Keep trunk steady.   _10__ reps per set, _3__ sets per day, _4__ days per week. Repeat while lying on the left side.  Copyright  VHI. All rights reserved.  HIP: Hamstrings - Short Sitting   Rest left leg on floor. Keep knee straight. Lift chest and bend at the hips. Hold _30__ seconds. Repeat with right leg. _3__ reps per set, _2__ sets per day, __7_ days per week  Copyright  VHI. All rights reserved.

## 2014-09-30 ENCOUNTER — Ambulatory Visit: Payer: Federal, State, Local not specified - PPO

## 2014-09-30 DIAGNOSIS — R269 Unspecified abnormalities of gait and mobility: Secondary | ICD-10-CM

## 2014-09-30 DIAGNOSIS — M6281 Muscle weakness (generalized): Secondary | ICD-10-CM

## 2014-09-30 DIAGNOSIS — R279 Unspecified lack of coordination: Secondary | ICD-10-CM

## 2014-09-30 NOTE — Therapy (Signed)
[facility] [address] [place], [place], [address] Phone: [phone]   Fax:  [phone]  Physical Therapy Treatment  Patient Details  Name: Randy Ballard MRN: [id number] Date of Birth: 01-08-1961  Encounter Date: 09/30/2014      PT End of Session - 09/30/14 1102    Visit Number 5   Number of Visits 17   Date for PT Re-Evaluation 11/15/14   Authorization Type BCBS   PT Start Time 0848   PT Stop Time 0927   PT Time Calculation (min) 39 min   Equipment Utilized During Treatment Gait belt   Activity Tolerance Patient tolerated treatment well   Behavior During Therapy [facility] for tasks assessed/performed      Past Medical History  Diagnosis Date  . Diabetes mellitus without complication   . Hyperlipidemia   . Hypertension   . Hypogonadism male   . 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 Diverticulitis   . SCA-3 (spinocerebellar ataxia type 3)     Past Surgical History  Procedure Laterality Date  . Biopsy thyroid  02/06/2009    cold thyroid nodule; negative/benign.  . Colonoscopy w/ polypectomy  Feb 2014    5 polyps    There were no vitals taken for this visit.  Visit Diagnosis:  Abnormality of gait  Lack of coordination  Muscle weakness (generalized)      Subjective Assessment - 09/30/14 0851    Symptoms Pt denied falls or changes since last visit.    Pertinent History bone spur which compresses sciatic nerve, diabetes, and falls   Patient Stated Goals improve balance, no falls during ambulation   Currently in Pain? No/denies                    [facility] Adult PT Treatment/Exercise - 09/30/14 6962    Ambulation/Gait   Ambulation/Gait Yes   Ambulation/Gait Assistance 5: Supervision;Other (comment)  min guard without rollator   Ambulation/Gait Assistance Details Pt ambulated over even terrain with and without rollator, while performing head  turns and turns. VC's to improve stride length, one LOB episode which pt self corrected with stepping strategy. Pt noted to ambulate at slower pace while performing dynamic gait activities.   Ambulation Distance (Feet) --  275', 130', 75'x2 with rollator, 230' and 130' with no AD   Assistive device Rollator;None   Gait Pattern Step-through pattern;Decreased stride length;Decreased dorsiflexion - right;Decreased dorsiflexion - left;Wide base of support;Decreased trunk rotation   Balance   Balance Assessed Yes   Dynamic Standing Balance   Dynamic Standing - Balance Support Right upper extremity supported;No upper extremity supported   Dynamic Standing - Level of Assistance Other (comment);4: Min assist  min guard   Dynamic Standing - Balance Activities Forward lean/weight shifting;Lateral lean/weight shifting;Alternating  foot traps   Dynamic Standing - Comments Pt performed ant/post/lat. weight shifting on rockerboard (2") x20/direction. Pt also performed B heel taps on dots x20/LE. Pt progressed from 1-2 UE support in parallel bars to no UE support. Pt required min A during 4 LOB episodes. VC's to improve weight shifting.                  PT Short Term Goals - 09/28/14 1311    PT SHORT TERM GOAL #1   Title Pt will be independent in HEP to improve functional mobility. Target date: 10/14/14.   Status On-going   PT SHORT TERM GOAL #2  Title Pt will perform TUG with LRAD in </=18 seconds, to decrease falls risk. Target date: 10/14/14.   Status On-going   PT SHORT TERM GOAL #3   Title Pt will improve gait speed to >/=2.20ft/sec., with LRAD, to improve functional mobility. Target date: 10/14/14.   Status On-going   PT SHORT TERM GOAL #4   Title Pt will be able to ambulate 300' , with LRAD, and supevision over even/uneven terrain to improve functional mobility. Target date: 10/14/14.   Status On-going   PT SHORT TERM GOAL #5   Title Assess BERG and write appropriate STG and LTG goal. Target  date: 10/14/14.   Status Achieved   PT SHORT TERM GOAL #6   Title Pt will improve BERG balance score to >/=38/56 to decrease risk of falls. Target date: 10/14/14.   Status New           PT Long Term Goals - 09/28/14 1311    PT LONG TERM GOAL #1   Title Pt will verbalize understanding and agreement of falls prevention strategies. Target date: 11/11/14.   Status On-going   PT LONG TERM GOAL #2   Title Pt will perform TUG with LRAD in </=13.5 seconds, to decrease falls risk. Target date: 11/11/14.   Status On-going   PT LONG TERM GOAL #3   Title Pt will improve gait speed to >/=2.124ft/sec., with LRAD, to improve functional mobility. Target date: 11/11/14.   Status On-going   PT LONG TERM GOAL #4   Title Pt will verbalize understanding joining gym upon PT d/c, to maintain strength and endurance gains. Target date: 11/11/14.   Status On-going   PT LONG TERM GOAL #5   Title Pt will improve ABC score by 10% to improve quality of life. Target date: 11/11/14.   Status On-going   PT LONG TERM GOAL #6   Title Pt will ambulate 500', with LRAD, at MOD I level over even/uneven terrain to improve functional mobility. Target date: 11/11/14.   Status On-going   PT LONG TERM GOAL #7   Title Pt will improve BERG balance score to >/=42/56 to decrease risk of falls. Target date: 11/11/14.   Status New               Plan - 09/30/14 1106    Clinical Impression Statement Pt demonstrated progress as he was able to perform dynamic balance acitivites with min guard to min A to maintain balance. Pt continues to require rest breaks due to fatigue. Continue with POC.   Pt will benefit from skilled therapeutic intervention in order to improve on the following deficits Abnormal gait;Decreased coordination;Impaired flexibility;Decreased endurance;Impaired sensation;Decreased balance;Decreased strength;Decreased mobility;Impaired UE functional use   Rehab Potential Good   PT Frequency 2x / week   PT Duration 8 weeks    PT Treatment/Interventions ADLs/Self Care Home Management;Gait training;Neuromuscular re-education;Functional mobility training;Patient/family education;Therapeutic activities;Therapeutic exercise;Electrical Stimulation;DME Instruction;Balance training;Manual techniques   PT Next Visit Plan Dynamic gait training without rollator over compliant surfaces, progress dynamic balance activities. Check to see if Dr. Clelia CroftShaw sent OT referral to our clinic, if not send another request.   Consulted and Agree with Plan of Care Patient        Problem List Patient Active Problem List   Diagnosis Date Noted  . SCA-3 (spinocerebellar ataxia type 3) 12/17/2013  . Acute pancreatitis 04/16/2013  . Leukocytosis 04/16/2013  . BMI 34.0-34.9,adult 02/05/2012  . HTN (hypertension) 12/23/2011  . DM type 2 (diabetes mellitus, type 2) 12/23/2011  .  Hypothyroid 12/23/2011  . Dyslipidemia 12/23/2011    Sandy Haye L 09/30/2014, 11:09 AM  [facility] [address] [place], [place], [address] Phone: [phone]   Fax:  [phone]     Zerita BoersJennifer Merrill Deanda, PT,DPT 09/30/2014 11:09 AM Phone: [phone] Fax: [phone]

## 2014-10-05 ENCOUNTER — Ambulatory Visit: Payer: Federal, State, Local not specified - PPO

## 2014-10-05 DIAGNOSIS — R269 Unspecified abnormalities of gait and mobility: Secondary | ICD-10-CM

## 2014-10-05 DIAGNOSIS — R279 Unspecified lack of coordination: Secondary | ICD-10-CM

## 2014-10-05 DIAGNOSIS — M6281 Muscle weakness (generalized): Secondary | ICD-10-CM

## 2014-10-05 NOTE — Therapy (Signed)
[facility] [address] [place], [place], [address] Phone: [phone]   Fax:  [phone]  Physical Therapy Treatment  Patient Details  Name: Randy Ballard MRN: [id number] Date of Birth: Aug 26, 1961  Encounter Date: 10/05/2014      PT End of Session - 10/05/14 1310    Visit Number 6   Number of Visits 17   Date for PT Re-Evaluation 11/15/14   Authorization Type BCBS   PT Start Time [id number]   PT Stop Time 0930   PT Time Calculation (min) 39 min   Equipment Utilized During Treatment Gait belt   Activity Tolerance Patient tolerated treatment well   Behavior During Therapy [facility] for tasks assessed/performed      Past Medical History  Diagnosis Date  . Diabetes mellitus without complication   . Hyperlipidemia   . Hypertension   . Hypogonadism male   . 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 Diverticulitis   . SCA-3 (spinocerebellar ataxia type 3)     Past Surgical History  Procedure Laterality Date  . Biopsy thyroid  02/06/2009    cold thyroid nodule; negative/benign.  . Colonoscopy w/ polypectomy  Feb 2014    5 polyps    There were no vitals taken for this visit.  Visit Diagnosis:  Abnormality of gait  Lack of coordination  Muscle weakness (generalized)      Subjective Assessment - 10/05/14 0856    Symptoms Pt 5 minutes late.Pt denied falls or changes since last visit. "I feel pretty good." Pt reported he walked around [employer] for about 30 minutes, using the cart for balance.   Patient Stated Goals improve balance, no falls during ambulation   Currently in Pain? No/denies                    [facility]/Exercise - 10/05/14 0858    Transfers   Transfers Sit to Stand;Stand to Sit   Sit to Stand 5: Supervision;With upper extremity assist;From chair/3-in-1   Sit to Stand Details (indicate cue type and reason) x3. Demonstrated proper  technique.   Stand to Sit 5: Supervision;To chair/3-in-1;With upper extremity assist   Stand to Sit Details x3. VC's to improve eccentric control.   Ambulation/Gait   Ambulation/Gait Yes   Ambulation/Gait Assistance 5: Supervision;4: Min guard   Ambulation/Gait Assistance Details Pt ambulated over even terrain, without rollator, while performing head turns. Pt ambulated over compliant surface 4x7', VC's to go"up with the good, down with the bad" to successsfully clear mat threshold, as pt's L LE is weaker than R LE. VC's to improve weight shifting and decr. wide BOS during ambulation. Pt required seated rest breaks after ambulating due to fatigue.   Ambulation Distance (Feet) --  230', 75', 50', 4x7'   Assistive device Rollator;None   Gait Pattern Step-through pattern;Decreased stride length;Decreased dorsiflexion - right;Decreased dorsiflexion - left;Wide base of support;Decreased trunk rotation   Balance   Balance Assessed Yes   Dynamic Standing Balance   Dynamic Standing - Balance Support No upper extremity supported   Dynamic Standing - Level of Assistance 4: Min assist;Other (comment)  min guard   Dynamic Standing - Balance Activities Step ups;Forward lean/weight shifting;Head turns;Head nods;Eyes open   Dynamic Standing - Comments B LE: 2" step-ups x10/LE. VC's to improve weight shifting and min A to maintain balance during 3 LOB episodes. Pt performed 4" rockerboard ant/post weight shifting with and without  head turns, pt required min A during 5 LOB episodes with head turns and no UE support. Pt required seated rest breaks due to fatigue.                  PT Short Term Goals - 09/28/14 1311    PT SHORT TERM GOAL #1   Title Pt will be independent in HEP to improve functional mobility. Target date: 10/14/14.   Status On-going   PT SHORT TERM GOAL #2   Title Pt will perform TUG with LRAD in </=18 seconds, to decrease falls risk. Target date: 10/14/14.   Status On-going   PT  SHORT TERM GOAL #3   Title Pt will improve gait speed to >/=2.69ft/sec., with LRAD, to improve functional mobility. Target date: 10/14/14.   Status On-going   PT SHORT TERM GOAL #4   Title Pt will be able to ambulate 300' , with LRAD, and supevision over even/uneven terrain to improve functional mobility. Target date: 10/14/14.   Status On-going   PT SHORT TERM GOAL #5   Title Assess BERG and write appropriate STG and LTG goal. Target date: 10/14/14.   Status Achieved   PT SHORT TERM GOAL #6   Title Pt will improve BERG balance score to >/=38/56 to decrease risk of falls. Target date: 10/14/14.   Status New           PT Long Term Goals - 09/28/14 1311    PT LONG TERM GOAL #1   Title Pt will verbalize understanding and agreement of falls prevention strategies. Target date: 11/11/14.   Status On-going   PT LONG TERM GOAL #2   Title Pt will perform TUG with LRAD in </=13.5 seconds, to decrease falls risk. Target date: 11/11/14.   Status On-going   PT LONG TERM GOAL #3   Title Pt will improve gait speed to >/=2.80ft/sec., with LRAD, to improve functional mobility. Target date: 11/11/14.   Status On-going   PT LONG TERM GOAL #4   Title Pt will verbalize understanding joining gym upon PT d/c, to maintain strength and endurance gains. Target date: 11/11/14.   Status On-going   PT LONG TERM GOAL #5   Title Pt will improve ABC score by 10% to improve quality of life. Target date: 11/11/14.   Status On-going   PT LONG TERM GOAL #6   Title Pt will ambulate 500', with LRAD, at MOD I level over even/uneven terrain to improve functional mobility. Target date: 11/11/14.   Status On-going   PT LONG TERM GOAL #7   Title Pt will improve BERG balance score to >/=42/56 to decrease risk of falls. Target date: 11/11/14.   Status New               Plan - 10/05/14 1310    Clinical Impression Statement Pt demonstrated progress as he was able to ambulate without rollator and perform step-ups with less assist. Pt  continues to be limited by decreased endurance, as he required frequent seated rest breaks. Continue with POC.   Pt will benefit from skilled therapeutic intervention in order to improve on the following deficits Abnormal gait;Decreased coordination;Impaired flexibility;Decreased endurance;Impaired sensation;Decreased balance;Decreased strength;Decreased mobility;Impaired UE functional use   Rehab Potential Good   PT Frequency 2x / week   PT Duration 8 weeks   PT Treatment/Interventions ADLs/Self Care Home Management;Gait training;Neuromuscular re-education;Functional mobility training;Patient/family education;Therapeutic activities;Therapeutic exercise;Electrical Stimulation;DME Instruction;Balance training;Manual techniques   PT Next Visit Plan Dynamic gait training without rollator over compliant surfaces,  progress dynamic balance activities. Check to see if Dr. Clelia CroftShaw sent OT referral to our clinic, if not send another request.   Consulted and Agree with Plan of Care Patient        Problem List Patient Active Problem List   Diagnosis Date Noted  . SCA-3 (spinocerebellar ataxia type 3) 12/17/2013  . Acute pancreatitis 04/16/2013  . Leukocytosis 04/16/2013  . BMI 34.0-34.9,adult 02/05/2012  . HTN (hypertension) 12/23/2011  . DM type 2 (diabetes mellitus, type 2) 12/23/2011  . Hypothyroid 12/23/2011  . Dyslipidemia 12/23/2011    Joab Carden L 10/05/2014, 1:12 PM  [place] [facility] [address] [place], [place], [address] Phone: [phone]   Fax:  [phone]     Zerita BoersJennifer Bryer Gottsch, PT,DPT 10/05/2014 1:12 PM Phone: [phone] Fax: [phone]

## 2014-10-07 ENCOUNTER — Encounter: Payer: Self-pay | Admitting: Physical Therapy

## 2014-10-07 ENCOUNTER — Ambulatory Visit: Payer: Federal, State, Local not specified - PPO | Admitting: Physical Therapy

## 2014-10-07 DIAGNOSIS — M6281 Muscle weakness (generalized): Secondary | ICD-10-CM

## 2014-10-07 DIAGNOSIS — R279 Unspecified lack of coordination: Secondary | ICD-10-CM

## 2014-10-07 DIAGNOSIS — R269 Unspecified abnormalities of gait and mobility: Secondary | ICD-10-CM | POA: Diagnosis not present

## 2014-10-07 NOTE — Therapy (Signed)
[facility] [address] [place], [place], [address] Phone: [phone]   Fax:  [phone]  Physical Therapy Treatment  Patient Details  Name: Randy Ballard MRN: [id number] Date of Birth: [birth year]  Encounter Date: 10/07/2014      PT End of Session - 10/07/14 1300    Visit Number 7   Number of Visits 17   Date for PT Re-Evaluation 11/15/14   Authorization Type BCBS   PT Start Time 0940   PT Stop Time 1017   PT Time Calculation (min) 37 min   Equipment Utilized During Treatment Gait belt   Activity Tolerance Patient tolerated treatment well   Behavior During Therapy [facility] for tasks assessed/performed      Past Medical History  Diagnosis Date  . Diabetes mellitus without complication   . Hyperlipidemia   . Hypertension   . Hypogonadism male   . 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 Diverticulitis   . SCA-3 (spinocerebellar ataxia type 3)     Past Surgical History  Procedure Laterality Date  . Biopsy thyroid  02/06/2009    cold thyroid nodule; negative/benign.  . Colonoscopy w/ polypectomy  Feb 2014    5 polyps    There were no vitals taken for this visit.  Visit Diagnosis:  Abnormality of gait  Lack of coordination  Muscle weakness (generalized)      Subjective Assessment - 10/07/14 0944    Symptoms Pt arrived 10 minutes late for appointment.  Denies changes since last visit.  Reports L ankle hurts when he first gets up but improves as he moves around.   Currently in Pain? No/denies           [facility]/Exercise - 10/07/14 0953    Transfers   Transfers Sit to Stand;Stand to Sit   Sit to Stand 5: Supervision;With upper extremity assist;From chair/3-in-1   Sit to Stand Details (indicate cue type and reason) x 10 reps   Stand to Sit 5: Supervision;To chair/3-in-1;With upper extremity assist   Stand to Sit Details x 10 reps  controling descent   Ambulation/Gait   Ambulation/Gait Yes   Ambulation/Gait Assistance 5: Supervision   Ambulation/Gait Assistance Details no LOB noted   Ambulation Distance (Feet) 100 Feet  twice   Assistive device Rollator   Gait Pattern Step-through pattern;Decreased stride length;Decreased dorsiflexion - right;Decreased dorsiflexion - left;Wide base of support;Decreased trunk rotation   Knee/Hip Exercises: Standing   Other Standing Knee Exercises bil hamstring curl, hip flexion, hip abduction, hip extension-all x 10 x 2 with 4# weight   Knee/Hip Exercises: Seated   Long Arc Quad AROM;Both;2 sets;10 reps;Weights   Long Arc Quad Weight 4 lbs.   Other Seated Knee Exercises bil seated hip flexion x 10 x 2 sets with 4# weight           PT Short Term Goals - 09/28/14 1311    PT SHORT TERM GOAL #1   Title Pt will be independent in HEP to improve functional mobility. Target date: 10/14/14.   Status On-going   PT SHORT TERM GOAL #2   Title Pt will perform TUG with LRAD in </=18 seconds, to decrease falls risk. Target date: 10/14/14.   Status On-going   PT SHORT TERM GOAL #3   Title Pt will improve gait speed to >/=2.340ft/sec., with LRAD, to improve functional mobility. Target date: 10/14/14.   Status On-going   PT SHORT  TERM GOAL #4   Title Pt will be able to ambulate 300' , with LRAD, and supevision over even/uneven terrain to improve functional mobility. Target date: 10/14/14.   Status On-going   PT SHORT TERM GOAL #5   Title Assess BERG and write appropriate STG and LTG goal. Target date: 10/14/14.   Status Achieved   PT SHORT TERM GOAL #6   Title Pt will improve BERG balance score to >/=38/56 to decrease risk of falls. Target date: 10/14/14.   Status New           PT Long Term Goals - 09/28/14 1311    PT LONG TERM GOAL #1   Title Pt will verbalize understanding and agreement of falls prevention strategies. Target date: 11/11/14.   Status On-going   PT LONG TERM GOAL #2   Title Pt  will perform TUG with LRAD in </=13.5 seconds, to decrease falls risk. Target date: 11/11/14.   Status On-going   PT LONG TERM GOAL #3   Title Pt will improve gait speed to >/=2.214ft/sec., with LRAD, to improve functional mobility. Target date: 11/11/14.   Status On-going   PT LONG TERM GOAL #4   Title Pt will verbalize understanding joining gym upon PT d/c, to maintain strength and endurance gains. Target date: 11/11/14.   Status On-going   PT LONG TERM GOAL #5   Title Pt will improve ABC score by 10% to improve quality of life. Target date: 11/11/14.   Status On-going   PT LONG TERM GOAL #6   Title Pt will ambulate 500', with LRAD, at MOD I level over even/uneven terrain to improve functional mobility. Target date: 11/11/14.   Status On-going   PT LONG TERM GOAL #7   Title Pt will improve BERG balance score to >/=42/56 to decrease risk of falls. Target date: 11/11/14.   Status New               Plan - 10/07/14 1301    Clinical Impression Statement Continues with decreased endurance and needs rest breaks during treatment.  Motivated to improve mobility.  Continue per POC.  Pt unsure if he will schedule more appointments after next week due to finances.   Pt will benefit from skilled therapeutic intervention in order to improve on the following deficits Abnormal gait;Decreased coordination;Impaired flexibility;Decreased endurance;Impaired sensation;Decreased balance;Decreased strength;Decreased mobility;Impaired UE functional use   Rehab Potential Good   PT Frequency 2x / week   PT Duration 8 weeks   PT Treatment/Interventions ADLs/Self Care Home Management;Gait training;Neuromuscular re-education;Functional mobility training;Patient/family education;Therapeutic activities;Therapeutic exercise;Electrical Stimulation;DME Instruction;Balance training;Manual techniques   PT Next Visit Plan Continue dynamic balance and gait.  Start checking goals.   Consulted and Agree with Plan of Care Patient         Problem List Patient Active Problem List   Diagnosis Date Noted  . SCA-3 (spinocerebellar ataxia type 3) 12/17/2013  . Acute pancreatitis 04/16/2013  . Leukocytosis 04/16/2013  . BMI 34.0-34.9,adult 02/05/2012  . HTN (hypertension) 12/23/2011  . DM type 2 (diabetes mellitus, type 2) 12/23/2011  . Hypothyroid 12/23/2011  . Dyslipidemia 12/23/2011    Newell Coralobertson, Denise Terry 10/07/2014, 1:06 PM  [place] [facility] [address] [place], [place], [address] Phone: [phone]   Fax:  [phone]    Newell CoralDenise Terry Robertson, [place]A [facility] 10/07/2014 1:07 PM Phone: [phone] Fax: [phone]

## 2014-10-12 ENCOUNTER — Ambulatory Visit: Payer: Federal, State, Local not specified - PPO | Attending: Family Medicine

## 2014-10-12 DIAGNOSIS — R269 Unspecified abnormalities of gait and mobility: Secondary | ICD-10-CM | POA: Diagnosis not present

## 2014-10-12 DIAGNOSIS — M6281 Muscle weakness (generalized): Secondary | ICD-10-CM

## 2014-10-12 DIAGNOSIS — R279 Unspecified lack of coordination: Secondary | ICD-10-CM | POA: Insufficient documentation

## 2014-10-12 NOTE — Patient Instructions (Signed)
"  I love a Licensed conveyancer   Using a chair if necessary (or hold onto counter), march in place 10 times with each leg, with 4 pound ankle weight on each ankle. Repeat _3___ times. Do __4__ sessions per week.  http://gt2.exer.us/344   Copyright  VHI. All rights reserved.   Back Leg Kick   With 4 pound ankle weight on each ankle. Swing leg back as far as possible, keeping knee straight and trunk upright (don't lean forward). Return to center. Repeat with other leg. Perform 3 sets of 10 per leg. Do _4___ sessions per week.  http://gt2.exer.us/483   Copyright  VHI. All rights reserved.   ABDUCTION: Standing (Active)   Stand, feet flat and take half a step back with right leg and then lift right leg out to side. Make sure your toes and hips are pointed forward.  Use _4__ lbs ankle weight. Repeat with left leg. Complete _3__ sets of _10__ repetitions. Perform _4__ sessions per week  Knee Extension: Quadriceps Double Leg Minisquat   Standing while holding onto counter, slowly bend knees and hold for 3-5 seconds. Then straight knees.  _10__ reps per set, __1_ sets per day, _7__ days per week.  Copyright  VHI. All rights reserved.  .  http://gtsc.exer.us/110   Copyright  VHI. All rights reserved.   Hamstring Curl: Standing (Single Leg)   In shoulder width stance, with 4 pound ankle weight on each leg. Bend right knee, foot toward buttock. Repeat _10_ times per set. Repeat with other leg. Do _3_ sets per session. Do _4_ sessions per week.  http://tub.exer.us/197   Copyright  VHI. All rights reserved.

## 2014-10-12 NOTE — Therapy (Signed)
[facility] [address] [place], [place], [address] Phone: [phone]   Fax:  [phone]  Physical Therapy Treatment  Patient Details  Name: Randy Ballard MRN: [id number] Date of Birth: [birth year]  Encounter Date: 10/12/2014      PT End of Session - 10/12/14 1307    Visit Number 8   Number of Visits 17   Date for PT Re-Evaluation 11/15/14   Authorization Type BCBS   PT Start Time 1107   PT Stop Time 1146   PT Time Calculation (min) 39 min   Equipment Utilized During Treatment Gait belt   Activity Tolerance Patient tolerated treatment well   Behavior During Therapy [facility] for tasks assessed/performed      Past Medical History  Diagnosis Date  . Diabetes mellitus without complication   . Hyperlipidemia   . Hypertension   . Hypogonadism male   . 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 Diverticulitis   . SCA-3 (spinocerebellar ataxia type 3)     Past Surgical History  Procedure Laterality Date  . Biopsy thyroid  02/06/2009    cold thyroid nodule; negative/benign.  . Colonoscopy w/ polypectomy  Feb 2014    5 polyps    There were no vitals taken for this visit.  Visit Diagnosis:  Abnormality of gait  Lack of coordination  Muscle weakness (generalized)      Subjective Assessment - 10/12/14 1111    Symptoms Pt arrived 7 minutes late. Pt denied changes or falls since last visit.     Pertinent History bone spur which compresses sciatic nerve, diabetes, and falls   Patient Stated Goals improve balance, no falls during ambulation   Currently in Pain? No/denies                    [facility] Adult PT Treatment/Exercise - 10/12/14 1116    Exercises   Exercises Knee/Hip   Knee/Hip Exercises: Standing   Other Standing Knee Exercises B LE: mini squats x10 with B UE support in parallel bars; hamstring curl, hip flexion, hip abduction, hip extension: 3x  10/activity with one UE support in bars and 4# ankle weight. VC's to improve upright posture and technique and to decrease hip flexion during hamstring curls.                PT Education - 10/12/14 1307    Education provided Yes   Education Details Strengthening HEP   Person(s) Educated Patient   Methods Explanation;Demonstration;Tactile cues;Verbal cues;Handout   Comprehension Verbalized understanding;Returned demonstration          PT Short Term Goals - 09/28/14 1311    PT SHORT TERM GOAL #1   Title Pt will be independent in HEP to improve functional mobility. Target date: 10/14/14.   Status On-going   PT SHORT TERM GOAL #2   Title Pt will perform TUG with LRAD in </=18 seconds, to decrease falls risk. Target date: 10/14/14.   Status On-going   PT SHORT TERM GOAL #3   Title Pt will improve gait speed to >/=2.70ft/sec., with LRAD, to improve functional mobility. Target date: 10/14/14.   Status On-going   PT SHORT TERM GOAL #4   Title Pt will be able to ambulate 300' , with LRAD, and supevision over even/uneven terrain to improve functional mobility. Target date: 10/14/14.   Status On-going   PT SHORT TERM GOAL #5   Title Assess BERG and write  appropriate STG and LTG goal. Target date: 10/14/14.   Status Achieved   PT SHORT TERM GOAL #6   Title Pt will improve BERG balance score to >/=38/56 to decrease risk of falls. Target date: 10/14/14.   Status New           PT Long Term Goals - 09/28/14 1311    PT LONG TERM GOAL #1   Title Pt will verbalize understanding and agreement of falls prevention strategies. Target date: 11/11/14.   Status On-going   PT LONG TERM GOAL #2   Title Pt will perform TUG with LRAD in </=13.5 seconds, to decrease falls risk. Target date: 11/11/14.   Status On-going   PT LONG TERM GOAL #3   Title Pt will improve gait speed to >/=2.41ft/sec., with LRAD, to improve functional mobility. Target date: 11/11/14.   Status On-going   PT LONG TERM GOAL #4   Title  Pt will verbalize understanding joining gym upon PT d/c, to maintain strength and endurance gains. Target date: 11/11/14.   Status On-going   PT LONG TERM GOAL #5   Title Pt will improve ABC score by 10% to improve quality of life. Target date: 11/11/14.   Status On-going   PT LONG TERM GOAL #6   Title Pt will ambulate 500', with LRAD, at MOD I level over even/uneven terrain to improve functional mobility. Target date: 11/11/14.   Status On-going   PT LONG TERM GOAL #7   Title Pt will improve BERG balance score to >/=42/56 to decrease risk of falls. Target date: 11/11/14.   Status New               Plan - 10/12/14 1308    Clinical Impression Statement Pt demonstrated porgress, as he was able to tolerate increased sets during LE strengthening program (added to HEP). Pt reported he is unable to afford co-pay past this week, and would like to hold PT for 2 weeks and will call back if he would like to resume. Continue with POC.   Pt will benefit from skilled therapeutic intervention in order to improve on the following deficits Abnormal gait;Decreased coordination;Impaired flexibility;Decreased endurance;Impaired sensation;Decreased balance;Decreased strength;Decreased mobility;Impaired UE functional use   Rehab Potential Good   PT Frequency 2x / week   PT Duration 8 weeks   PT Treatment/Interventions ADLs/Self Care Home Management;Gait training;Neuromuscular re-education;Functional mobility training;Patient/family education;Therapeutic activities;Therapeutic exercise;Electrical Stimulation;DME Instruction;Balance training;Manual techniques   PT Next Visit Plan Check STGs and hold pt for 2 weeks.   Consulted and Agree with Plan of Care Patient        Problem List Patient Active Problem List   Diagnosis Date Noted  . SCA-3 (spinocerebellar ataxia type 3) 12/17/2013  . Acute pancreatitis 04/16/2013  . Leukocytosis 04/16/2013  . BMI 34.0-34.9,adult 02/05/2012  . HTN (hypertension)  12/23/2011  . DM type 2 (diabetes mellitus, type 2) 12/23/2011  . Hypothyroid 12/23/2011  . Dyslipidemia 12/23/2011    Naythen Heikkila L 10/12/2014, 1:10 PM  [facility] [address] [place], [place], [address] Phone: [phone]   Fax:  [phone]     Zerita Boers, PT,DPT 10/12/2014 1:10 PM Phone: [phone] Fax: [phone]

## 2014-10-14 ENCOUNTER — Ambulatory Visit: Payer: Federal, State, Local not specified - PPO

## 2014-10-14 DIAGNOSIS — R269 Unspecified abnormalities of gait and mobility: Secondary | ICD-10-CM

## 2014-10-14 DIAGNOSIS — M6281 Muscle weakness (generalized): Secondary | ICD-10-CM

## 2014-10-14 DIAGNOSIS — R279 Unspecified lack of coordination: Secondary | ICD-10-CM

## 2014-10-14 NOTE — Therapy (Signed)
[facility] [address] [place], [place], [address] Phone: [phone]   Fax:  [phone]  Physical Therapy Treatment  Patient Details  Name: Randy Ballard MRN: [id number] Date of Birth: 1961/03/19  Encounter Date: 10/14/2014      PT End of Session - 10/14/14 1512    Visit Number 9   Number of Visits 17   Date for PT Re-Evaluation 11/15/14   Authorization Type [employer]   PT Start Time 1109   PT Stop Time 1143   PT Time Calculation (min) 34 min   Equipment Utilized During Treatment Gait belt   Activity Tolerance Patient tolerated treatment well   Behavior During Therapy [facility] for tasks assessed/performed      Past Medical History  Diagnosis Date  . Diabetes mellitus without complication   . Hyperlipidemia   . Hypertension   . Hypogonadism male   . 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 Diverticulitis   . SCA-3 (spinocerebellar ataxia type 3)     Past Surgical History  Procedure Laterality Date  . Biopsy thyroid  02/06/2009    cold thyroid nodule; negative/benign.  . Colonoscopy w/ polypectomy  Feb 2014    5 polyps    There were no vitals taken for this visit.  Visit Diagnosis:  Abnormality of gait  Lack of coordination  Muscle weakness (generalized)      Subjective Assessment - 10/14/14 1112    Symptoms Pt arrived 8 minutes late. Pt denied falls or changes since last visit. Pt reported he had to walk outside yesterday, for jury duty and he was fatigued after walking long distances.   Pertinent History bone spur which compresses sciatic nerve, diabetes, and falls   Patient Stated Goals improve balance, no falls during ambulation   Currently in Pain? No/denies                    [facility] Adult PT Treatment/Exercise - 10/14/14 1113    Ambulation/Gait   Ambulation/Gait Yes   Ambulation/Gait Assistance 5: Supervision   Ambulation/Gait Assistance  Details Pt ambulated over even/uneven terrain with 1 LOB while turning but self corrected with stepping strategy. VC's to stay close to rollator.   Ambulation Distance (Feet) --  100', 325', 75'   Assistive device Rollator   Gait Pattern Step-through pattern;Decreased stride length;Decreased dorsiflexion - right;Decreased dorsiflexion - left;Wide base of support   Gait velocity 2.53f/sec.  10MWT: 11.03 sec.   Standardized Balance Assessment   Standardized Balance Assessment Berg Balance Test;Timed Up and Go Test   Berg Balance Test   Sit to Stand Able to stand without using hands and stabilize independently   Standing Unsupported Able to stand safely 2 minutes   Sitting with Back Unsupported but Feet Supported on Floor or Stool Able to sit safely and securely 2 minutes   Stand to Sit Sits safely with minimal use of hands   Transfers Able to transfer safely, minor use of hands   Standing Unsupported with Eyes Closed Able to stand 10 seconds with supervision   Standing Ubsupported with Feet Together Able to place feet together independently and stand 1 minute safely   From Standing, Reach Forward with Outstretched Arm Can reach forward >12 cm safely (5")  6"   From Standing Position, Pick up Object from Floor Able to pick up shoe, needs supervision   From Standing Position, Turn to Look Behind Over each Shoulder Looks  behind one side only/other side shows less weight shift   Turn 360 Degrees Able to turn 360 degrees safely but slowly   Standing Unsupported, Alternately Place Feet on Step/Stool Able to complete 4 steps without aid or supervision   Standing Unsupported, One Foot in Front Able to plae foot ahead of the other independently and hold 30 seconds   Standing on One Leg Able to lift leg independently and hold equal to or more than 3 seconds   Total Score 45   Timed Up and Go Test   TUG Normal TUG   Normal TUG (seconds) 16.12  with rollator                PT Education -  10/14/14 1511    Education provided Yes   Education Details PT educated pt on the importance of continuing HEP, in order to maintain gains made in PT. Pt requested to hold PT for 2 weeks due to financial reasons; PT informed pt that PT will hold pt for 2 weeks and then D/C if pt does not return after 2 weeks. PT explained that pt will need new PT order if he needs PT after d/c period.   Person(s) Educated Patient   Methods Explanation   Comprehension Verbalized understanding          PT Short Term Goals - 10/14/14 1516    PT SHORT TERM GOAL #1   Title Pt will be independent in HEP to improve functional mobility. Target date: 10/14/14.   Status Achieved   PT SHORT TERM GOAL #2   Title Pt will perform TUG with LRAD in </=18 seconds, to decrease falls risk. Target date: 10/14/14.   Baseline 16.12 sec. on 10/14/14.   Status Achieved   PT SHORT TERM GOAL #3   Title Pt will improve gait speed to >/=2.30f/sec., with LRAD, to improve functional mobility. Target date: 10/14/14.   Baseline 2.947fsec. on 10/14/14 with rollator.   Status Achieved   PT SHORT TERM GOAL #4   Title Pt will be able to ambulate 300' , with LRAD, and supevision over even/uneven terrain to improve functional mobility. Target date: 10/14/14.   Baseline 325' on 10/14/14.   Status Achieved   PT SHORT TERM GOAL #5   Title Assess BERG and write appropriate STG and LTG goal. Target date: 10/14/14.   Status Achieved   PT SHORT TERM GOAL #6   Title Pt will improve BERG balance score to >/=38/56 to decrease risk of falls. Target date: 10/14/14.   Baseline 45/56 on 10/14/14.   Status Achieved           PT Long Term Goals - 10/14/14 1517    PT LONG TERM GOAL #1   Title Pt will verbalize understanding and agreement of falls prevention strategies. Target date: 11/11/14.   Status On-going   PT LONG TERM GOAL #2   Title Pt will perform TUG with LRAD in </=13.5 seconds, to decrease falls risk. Target date: 11/11/14.   Status On-going   PT LONG  TERM GOAL #3   Title Pt will improve gait speed to >/=2.35f135fec., with LRAD, to improve functional mobility. Target date: 11/11/14.   Baseline 2.47f18fc. on 10/14/14.   Status Achieved   PT LONG TERM GOAL #4   Title Pt will verbalize understanding joining gym upon PT d/c, to maintain strength and endurance gains. Target date: 11/11/14.   Status On-going   PT LONG TERM GOAL #5   Title Pt will improve ABC  score by 10% to improve quality of life. Target date: 11/11/14.   Status On-going   PT LONG TERM GOAL #6   Title Pt will ambulate 500', with LRAD, at MOD I level over even/uneven terrain to improve functional mobility. Target date: 11/11/14.   Status On-going   PT LONG TERM GOAL #7   Title Pt will improve BERG balance score to >/=42/56 to decrease risk of falls. Target date: 11/11/14.   Baseline 45/56 on 10/14/14.   Status Achieved               Plan - 10/14/14 1515    Clinical Impression Statement Pt demonstrated progress as he met 5/5 STGs, and 2/7 LTGs. Pt wishes to hold PT at this time to financial concerns, PT will hold chart for 2 weeks and will d/c pt if he does not reschedule prior to 2 weeks-pt is aware. Pt would continue to benefit from skliled PT to improve safety during functional mobility.   Pt will benefit from skilled therapeutic intervention in order to improve on the following deficits Abnormal gait;Decreased coordination;Impaired flexibility;Decreased endurance;Impaired sensation;Decreased balance;Decreased strength;Decreased mobility;Impaired UE functional use   Rehab Potential Good   PT Frequency 2x / week   PT Duration 8 weeks   PT Treatment/Interventions ADLs/Self Care Home Management;Gait training;Neuromuscular re-education;Functional mobility training;Patient/family education;Therapeutic activities;Therapeutic exercise;Electrical Stimulation;DME Instruction;Balance training;Manual techniques   PT Next Visit Plan Hold fo 2 weeks. If pt returns continue dynamic gait/balance  training and update HEP as needed.   Consulted and Agree with Plan of Care Patient        Problem List Patient Active Problem List   Diagnosis Date Noted  . SCA-3 (spinocerebellar ataxia type 3) 12/17/2013  . Acute pancreatitis 04/16/2013  . Leukocytosis 04/16/2013  . BMI 34.0-34.9,adult 02/05/2012  . HTN (hypertension) 12/23/2011  . DM type 2 (diabetes mellitus, type 2) 12/23/2011  . Hypothyroid 12/23/2011  . Dyslipidemia 12/23/2011    Kapena Hamme L 10/14/2014, 3:20 PM  [facility] [address] [place], [place], [address] Phone: [phone]   Fax:  [phone]     Geoffry Paradise, PT,DPT 10/14/2014 3:20 PM Phone: [phone] Fax: [phone]

## 2014-10-26 ENCOUNTER — Ambulatory Visit (INDEPENDENT_AMBULATORY_CARE_PROVIDER_SITE_OTHER): Payer: Federal, State, Local not specified - PPO | Admitting: Diagnostic Neuroimaging

## 2014-10-26 ENCOUNTER — Ambulatory Visit (INDEPENDENT_AMBULATORY_CARE_PROVIDER_SITE_OTHER): Payer: Federal, State, Local not specified - PPO | Admitting: Internal Medicine

## 2014-10-26 ENCOUNTER — Telehealth: Payer: Self-pay | Admitting: *Deleted

## 2014-10-26 ENCOUNTER — Encounter: Payer: Self-pay | Admitting: Diagnostic Neuroimaging

## 2014-10-26 VITALS — BP 124/84 | HR 79 | Temp 98.3°F | Resp 16 | Ht 68.0 in | Wt 232.0 lb

## 2014-10-26 VITALS — BP 168/78 | HR 82 | Temp 98.3°F | Ht 67.0 in | Wt 235.0 lb

## 2014-10-26 DIAGNOSIS — R002 Palpitations: Secondary | ICD-10-CM

## 2014-10-26 DIAGNOSIS — E119 Type 2 diabetes mellitus without complications: Secondary | ICD-10-CM

## 2014-10-26 DIAGNOSIS — I10 Essential (primary) hypertension: Secondary | ICD-10-CM

## 2014-10-26 DIAGNOSIS — I499 Cardiac arrhythmia, unspecified: Secondary | ICD-10-CM

## 2014-10-26 DIAGNOSIS — G118 Other hereditary ataxias: Secondary | ICD-10-CM

## 2014-10-26 NOTE — Progress Notes (Signed)
   Subjective:    Patient ID: Randy Ballard, male    DOB: October 26, 1960, 54 y.o.   MRN: [id number]  HPI Feels fine and normal for him. Neurologist concerned about HBP in his office this am, somewhere in range of 160s/100s. Also noticed lots of irregular heart beats. Randy Ballard did take otc cold preps with decongestants and thinks this triggered his problem.  No dizzy or syncope, or cp, or sob. HTN usually controlled as is his diabetes. He is scheduled to see Dr. Clelia Ballard in next 1-2 weeks. Last  Review of Systems Epic chart reviewed    Objective:   Physical Exam  Constitutional: He is oriented to person, place, and time. He appears well-developed and well-nourished. No distress.  HENT:  Head: Normocephalic.  Mouth/Throat: Oropharynx is clear and moist.  Eyes: EOM are normal. Pupils are equal, round, and reactive to light.  Neck: Normal range of motion.  Cardiovascular: Normal rate, S1 normal and S2 normal.  A regularly irregular rhythm present. Frequent extrasystoles are present. Exam reveals no gallop.   No murmur heard. Pulmonary/Chest: Effort normal and breath sounds normal. No respiratory distress. He exhibits no tenderness.  Neurological: He is alert and oriented to person, place, and time. No cranial nerve deficit. He exhibits abnormal muscle tone. Coordination abnormal.  Psychiatric: He has a normal mood and affect. His behavior is normal. Thought content normal.  Vitals reviewed.   EKG NSR with ectopics probably PACS consistent with past EKGs, asymptomatic  BP 120/80    Assessment & Plan:  HTN back to goal Stop! All decongestants always See Dr. Clelia Ballard soon as scheduled

## 2014-10-26 NOTE — Patient Instructions (Signed)
Please see your PCP today regarding high blood pressure readings and irregular heart beat.  Continue exercises.

## 2014-10-26 NOTE — Patient Instructions (Signed)
Premature Beats A premature beat is an extra heartbeat that happens earlier than normal. Premature beats are called premature atrial contractions (PACs) or premature ventricular contractions (PVCs) depending on the area of the heart where they start. CAUSES  Premature beats may be brought on by a variety of factors including:  Emotional stress.  Lack of sleep.  Caffeine.  Asthma medicines.  Stimulants.  Herbal teas.  Dietary supplements.  Alcohol. In most cases, premature beats are not dangerous and are not a sign of serious heart disease. Most patients evaluated for premature beats have completely normal heart function. Rarely, premature beats may be a sign of more significant heart problems or medical illness. SYMPTOMS  Premature beats may cause palpitations. This means you feel like your heart is skipping a beat or beating harder than usual. Sometimes, slight chest pain occurs with premature beats, lasting only a few seconds. This pain has been described as a "flopping" feeling inside the chest. In many cases, premature beats do not cause any symptoms and they are only detected when an electrocardiography test (EKG) or heart monitoring is performed. DIAGNOSIS  Your caregiver may run some tests to evaluate your heart such as an EKG or echocardiography. You may need to wear a portable heart monitor for several days to record the electrical activity of your heart. Blood testing may also be performed to check your electrolytes and thyroid function. TREATMENT  Premature beats usually go away with rest. If the problem continues, your caregiver will determine a treatment plan for you.  HOME CARE INSTRUCTIONS  Get plenty of rest over the next few days until your symptoms improve.  Avoid coffee, tea, alcohol, and soda (pop, cola).  Do not smoke. SEEK MEDICAL CARE IF:  Your symptoms continue after 1 to 2 days of rest.  You have new symptoms, such as chest pain or trouble  breathing. SEEK IMMEDIATE MEDICAL CARE IF:  You have severe chest pain or abdominal pain.  You have pain that radiates into the neck, arm, or jaw.  You faint or have extreme weakness.  You have shortness of breath.  Your heartbeat races for more than 5 seconds. MAKE SURE YOU:  Understand these instructions.  Will watch your condition.  Will get help right away if you are not doing well or get worse. Document Released: 11/02/2004 Document Revised: 12/18/2011 Document Reviewed: 05/29/2011 [facility] Patient Information 2015 [place], [place]. This information is not intended to replace advice given to you by your health care provider. Make sure you discuss any questions you have with your health care provider. Hypertension Hypertension, commonly called high blood pressure, is when the force of blood pumping through your arteries is too strong. Your arteries are the blood vessels that carry blood from your heart throughout your body. A blood pressure reading consists of a higher number over a lower number, such as 110/72. The higher number (systolic) is the pressure inside your arteries when your heart pumps. The lower number (diastolic) is the pressure inside your arteries when your heart relaxes. Ideally you want your blood pressure below 120/80. Hypertension forces your heart to work harder to pump blood. Your arteries may become narrow or stiff. Having hypertension puts you at risk for heart disease, stroke, and other problems.  RISK FACTORS Some risk factors for high blood pressure are controllable. Others are not.  Risk factors you cannot control include:   Race. You may be at higher risk if you are African American.  Age. Risk increases with age.  Gender. Men are at higher risk than women before age 60 years. After age 39, women are at higher risk than men. Risk factors you can control include:  Not getting enough exercise or physical activity.  Being overweight.  Getting too  much fat, sugar, calories, or salt in your diet.  Drinking too much alcohol. SIGNS AND SYMPTOMS Hypertension does not usually cause signs or symptoms. Extremely high blood pressure (hypertensive crisis) may cause headache, anxiety, shortness of breath, and nosebleed. DIAGNOSIS  To check if you have hypertension, your health care provider will measure your blood pressure while you are seated, with your arm held at the level of your heart. It should be measured at least twice using the same arm. Certain conditions can cause a difference in blood pressure between your right and left arms. A blood pressure reading that is higher than normal on one occasion does not mean that you need treatment. If one blood pressure reading is high, ask your health care provider about having it checked again. TREATMENT  Treating high blood pressure includes making lifestyle changes and possibly taking medicine. Living a healthy lifestyle can help lower high blood pressure. You may need to change some of your habits. Lifestyle changes may include:  Following the DASH diet. This diet is high in fruits, vegetables, and whole grains. It is low in salt, red meat, and added sugars.  Getting at least 2 hours of brisk physical activity every week.  Losing weight if necessary.  Not smoking.  Limiting alcoholic beverages.  Learning ways to reduce stress. If lifestyle changes are not enough to get your blood pressure under control, your health care provider may prescribe medicine. You may need to take more than one. Work closely with your health care provider to understand the risks and benefits. HOME CARE INSTRUCTIONS  Have your blood pressure rechecked as directed by your health care provider.   Take medicines only as directed by your health care provider. Follow the directions carefully. Blood pressure medicines must be taken as prescribed. The medicine does not work as well when you skip doses. Skipping doses also  puts you at risk for problems.   Do not smoke.   Monitor your blood pressure at home as directed by your health care provider. SEEK MEDICAL CARE IF:   You think you are having a reaction to medicines taken.  You have recurrent headaches or feel dizzy.  You have swelling in your ankles.  You have trouble with your vision. SEEK IMMEDIATE MEDICAL CARE IF:  You develop a severe headache or confusion.  You have unusual weakness, numbness, or feel faint.  You have severe chest or abdominal pain.  You vomit repeatedly.  You have trouble breathing. MAKE SURE YOU:   Understand these instructions.  Will watch your condition.  Will get help right away if you are not doing well or get worse. Document Released: 09/25/2005 Document Revised: 02/09/2014 Document Reviewed: 07/18/2013 [facility] Patient Information 2015 [place], [place]. This information is not intended to replace advice given to you by your health care provider. Make sure you discuss any questions you have with your health care provider.

## 2014-10-26 NOTE — Progress Notes (Signed)
[facility]  PATIENT: Randy Ballard DOB: [birth year]  REFERRING CLINICIAN:  HISTORY FROM: patient  REASON FOR VISIT: follow up   HISTORICAL  CHIEF COMPLAINT:  Chief Complaint  Patient presents with  . 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 Now doing exercises at home. No falls. No major pain or spasms. No slurred speech or trouble swallowing.   UPDATE 04/20/14: Since last visit, sxs are stable. Now approved for SSI/SSD, due to start in Sept 2015. Not able to afford PT yet, but hopefully in Oct 2015. Int muscle twitching, without pain.   PRIOR HPI (12/17/13): 54 year old male here for valuation of SCA type 3. 2012 patient developed some muscle weakness, difficulty climbing stairs, balance difficulty. By 2014 his balance is significantly affected. He went through physical therapy without relief. On 11/14/2013 patient quit his job because of these balance and walking difficulties. Patient's mother developed balance and walking problems 15 years ago (when she was 55 years old), resulting in multiple falls. Eventually she was transitioned to a nursing home. She was ultimately diagnosed with spinocerebellar ataxia type III. Patient's maternal grandmother and maternal aunt also have similar condition. Patient himself does not have any children. Patient has no siblings. Patient underwent gene testing which was positive for spinocerebellar ataxia type III. Nowadays patient having some muscle twitching in his quadriceps, shoulders, arms as well as some slurred speech. He's having difficulty with uneven and slip resurfaces.  REVIEW OF SYSTEMS: Full 14 system review of systems performed and notable only for joint pain, weakness gait diff cough fatigue.    ALLERGIES: No Known Allergies  HOME MEDICATIONS: Outpatient Prescriptions  Prior to Visit  Medication Sig Dispense Refill  . aspirin 81 MG tablet Take 1 tablet (81 mg total) by mouth daily. 30 tablet 11  . atorvastatin (LIPITOR) 40 MG tablet Take 1 tablet (40 mg total) by mouth daily. 30 tablet 5  . Blood Glucose Monitoring Suppl (BLOOD GLUCOSE METER) kit Test blood sugar daily 1 each 0  . calcium-vitamin D (OSCAL WITH D) 500-200 MG-UNIT per tablet Take 1 tablet by mouth 2 (two) times daily.    Marland Kitchen glimepiride (AMARYL) 4 MG tablet Take 2 tablets (8 mg total) by mouth daily with breakfast. 60 tablet 5  . glucose blood test strip Test blood sugar daily. 100 each 3  . Lancet Devices (LANCING DEVICE) MISC Test blood sugar daily. 1 each 0  . Lancets MISC Test blood sugar daily 100 each 3  . levothyroxine (SYNTHROID) 125 MCG tablet Take 1 tablet (125 mcg total) by mouth daily before breakfast. 30 tablet 5  . lisinopril-hydrochlorothiazide (PRINZIDE,ZESTORETIC) 20-25 MG per tablet Take 1 tablet by mouth daily. 30 tablet 5  . metFORMIN (GLUCOPHAGE) 1000 MG tablet Take 1 tablet (1,000 mg total) by mouth 2 (two) times daily with a meal. 60 tablet 5  . metoprolol succinate (TOPROL-XL) 100 MG 24 hr tablet Take 1 tablet (100 mg total) by mouth daily. Take with or immediately following a meal. 30 tablet 5   No facility-administered medications prior to visit.    PAST MEDICAL HISTORY: Past Medical History  Diagnosis Date  . Diabetes mellitus without complication   . Hyperlipidemia   . Hypertension   . Hypogonadism male   . Thyroid disease 02/06/2009    Graves s/p RAIU; cold nodule s/p negative  biopsy  . Diverticulitis   . SCA-3 (spinocerebellar ataxia type 3)     PAST SURGICAL HISTORY: Past Surgical History  Procedure Laterality Date  . Biopsy thyroid  02/06/2009    cold thyroid nodule; negative/benign.  . Colonoscopy w/ polypectomy  Feb 2014    5 polyps    FAMILY HISTORY: Family History  Problem Relation Age of Onset  . Adopted: Yes  . Ataxia Mother 23    SCA3  .  Ataxia Maternal Aunt     SCA3  . Ataxia Maternal Grandmother     SCA3    SOCIAL HISTORY:  History   Social History  . Marital Status: Married    Spouse Name: Helene Kelp    Number of Children: 0  . Years of Education: HS   Occupational History  . service rep   . retired    Social History Main Topics  . Smoking status: Current Some Day Smoker -- 1.00 packs/day for 32 years    Types: Cigarettes  . Smokeless tobacco: Never Used  . Alcohol Use: No  . Drug Use: No  . Sexual Activity: Yes     Comment: 1 partner in last 12 months   Other Topics Concern  . Not on file   Social History Narrative   Marital status:  Married x 20 years; happily.      Children: none      Lives: with wife.      Employment: delivers oxygen; works for [employer]; drives locally      Tobacco: trying to quit 10/2012.      Alcohol:  Once per month      Drugs: none      Exercise: treadmill sporadic.    PHYSICAL EXAM  Filed Vitals:   10/26/14 1135  BP: 168/78  Pulse: 82  Temp: 98.3 F (36.8 C)  TempSrc: Oral  Height: 5' 7" (1.702 m)  Weight: 235 lb (106.595 kg)    Not recorded      Body mass index is 36.8 kg/(m^2).  GENERAL EXAM: Patient is in no distress; well developed, nourished and groomed; neck is supple  CARDIOVASCULAR: IRREGULAR HEART BEAT; NO MURMURS; NO CAROTID BRUITS  NEUROLOGIC: MENTAL STATUS: awake, alert, language fluent, comprehension intact, naming intact, fund of knowledge appropriate CRANIAL NERVE: pupils equal and reactive to light, visual fields full to confrontation, extraocular muscles intact, MILD SACCADIC DYSMETRIA, facial sensation and strength symmetric, hearing intact, palate elevates symmetrically, uvula midline, shoulder shrug symmetric, tongue midline; MILD DYSARTHRIA. MOTOR: normal bulk; MILD INCR TONE IN RUE AND BLE; full strength in the BUE; BLE 4+/5 WITH INCR TONE. BRADYKINESIA IN BUE AND BLE (WORSE IN LLE)  SENSORY: ABSENT VIB AT TOES. NORMAL PP, LT,  TEMP. COORDINATION: MILD DYSMETRIA WITH LUE > RUE REFLEXES: BUE TRACE, KNEES TRACE, ANKLES 0 GAIT/STATION: WIDE BASED GAIT, SLOW ATAXIC GAIT.     DIAGNOSTIC DATA (LABS, IMAGING, TESTING) - I reviewed patient records, labs, notes, testing and imaging myself where available.  Lab Results  Component Value Date   WBC 8.3 03/06/2014   HGB 13.8 03/06/2014   HCT 40.7 03/06/2014   MCV 90.0 03/06/2014   PLT 291 03/06/2014      Component Value Date/Time   NA 138 07/03/2014 0845   K 4.1 07/03/2014 0845   CL 102 07/03/2014 0845   CO2 23 07/03/2014 0845   GLUCOSE 124* 07/03/2014 0845   BUN 18 07/03/2014 0845   CREATININE 0.96 07/03/2014 0845   CREATININE 0.80 04/17/2013 0515   CALCIUM  9.5 07/03/2014 0845   PROT 7.8 07/03/2014 0845   ALBUMIN 4.6 07/03/2014 0845   AST 37 07/03/2014 0845   ALT 52 07/03/2014 0845   ALKPHOS 73 07/03/2014 0845   BILITOT 0.7 07/03/2014 0845   GFRNONAA >89 10/31/2013 1621   GFRNONAA >90 04/17/2013 0515   GFRAA >89 10/31/2013 1621   GFRAA >90 04/17/2013 0515   Lab Results  Component Value Date   CHOL 112 07/03/2014   HDL 28* 07/03/2014   LDLCALC 56 07/03/2014   TRIG 141 07/03/2014   CHOLHDL 4.0 07/03/2014   Lab Results  Component Value Date   HGBA1C 6.6 07/03/2014   Lab Results  Component Value Date   VITAMINB12 817 11/15/2012   Lab Results  Component Value Date   TSH 2.229 07/03/2014    I reviewed images myself and agree with interpretation. -VRP  06/25/07 MRI brain (with and without)  1. 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  2. No anterior lobe pituitary lesions to suggest a micro- or macroadenoma resulting in thyroid hormonal dysfunction.  3. Tiny focus of increased white matter signal in the right pons, likely due to small vessel disease from diabetes/hypertension.   ASSESSMENT AND PLAN  54 y.o. year old male here with spinocerebellar ataxia type 3, based on symptoms, family history and gene  testing. Unfortunately there is no specific treatment for this condition. Levodopa is sometimes used to help with tremor, stiffness and bradykinesia. Baclofen is sometimes used for also spasms. At this time patient's main issue is balance and walking. He has done well with first round PT exercises, and is continuing them at home.   New issue on 10/26/14 is accelerated HTN (194/81 --> 201/88 --> 168/78) and irregular heart beat on exam. Has been taking theraflu this week. No chest pain or shortness of breath. Will have patient follow up with PCP today for evaluation.  Dx: SCA 3   PLAN: - continue exercises and fall precautions - see PCP today re: high BP readings and irregular heart beat  Return in about 1 year (around 10/27/2015).    Penni Bombard, MD 6/80/3212, 24:82 PM Certified in Neurology, Neurophysiology and Neuroimaging  [facility] [address], [place], [place] [address] [phone]

## 2014-10-29 NOTE — Telephone Encounter (Signed)
Encounter complete. 

## 2014-10-30 ENCOUNTER — Other Ambulatory Visit: Payer: Self-pay | Admitting: Family Medicine

## 2014-10-30 DIAGNOSIS — R29898 Other symptoms and signs involving the musculoskeletal system: Secondary | ICD-10-CM

## 2014-10-30 DIAGNOSIS — G118 Other hereditary ataxias: Secondary | ICD-10-CM

## 2014-11-02 NOTE — Therapy (Signed)
[facility] [address] [place], [place], [address] Phone: [phone]   Fax:  [phone]  Patient Details  Name: Randy Ballard MRN: [id number] Date of Birth: [birth year] Referring Provider:  No ref. provider found  Encounter Date: 11/02/2014  PHYSICAL THERAPY DISCHARGE SUMMARY  Visits from Start of Care: 9  Current functional level related to goals / functional outcomes: PT SHORT TERM GOAL #1    Title Pt will be independent in HEP to improve functional mobility. Target date: 10/14/14.   Status Achieved   PT SHORT TERM GOAL #2   Title Pt will perform TUG with LRAD in </=18 seconds, to decrease falls risk. Target date: 10/14/14.   Baseline 16.12 sec. on 10/14/14.   Status Achieved   PT SHORT TERM GOAL #3   Title Pt will improve gait speed to >/=2.76ft/sec., with LRAD, to improve functional mobility. Target date: 10/14/14.   Baseline 2.26ft/sec. on 10/14/14 with rollator.   Status Achieved   PT SHORT TERM GOAL #4   Title Pt will be able to ambulate 300' , with LRAD, and supevision over even/uneven terrain to improve functional mobility. Target date: 10/14/14.   Baseline 325' on 10/14/14.   Status Achieved   PT SHORT TERM GOAL #5   Title Assess BERG and write appropriate STG and LTG goal. Target date: 10/14/14.   Status Achieved   PT SHORT TERM GOAL #6   Title Pt will improve BERG balance score to >/=38/56 to decrease risk of falls. Target date: 10/14/14.   Baseline 45/56 on 10/14/14.   Status Achieved           PT Long Term Goals - 10/14/14 1517    PT LONG TERM GOAL #1   Title Pt will verbalize understanding and agreement of falls prevention strategies. Target date: 11/11/14.   Status On-going   PT LONG TERM GOAL #2   Title Pt will perform TUG with LRAD in </=13.5 seconds, to decrease falls risk. Target date: 11/11/14.   Status On-going   PT LONG  TERM GOAL #3   Title Pt will improve gait speed to >/=2.42ft/sec., with LRAD, to improve functional mobility. Target date: 11/11/14.   Baseline 2.98ft/sec. on 10/14/14.   Status Achieved   PT LONG TERM GOAL #4   Title Pt will verbalize understanding joining gym upon PT d/c, to maintain strength and endurance gains. Target date: 11/11/14.   Status On-going   PT LONG TERM GOAL #5   Title Pt will improve ABC score by 10% to improve quality of life. Target date: 11/11/14.   Status On-going   PT LONG TERM GOAL #6   Title Pt will ambulate 500', with LRAD, at MOD I level over even/uneven terrain to improve functional mobility. Target date: 11/11/14.   Status On-going   PT LONG TERM GOAL #7   Title Pt will improve BERG balance score to >/=42/56 to decrease risk of falls. Target date: 11/11/14.   Baseline 45/56 on 10/14/14.   Status Achieved      Remaining deficits: Impaired balance and strength.   Education / Equipment: HEP, rollator  Plan: Patient agrees to discharge.  Patient goals were partially met. Patient is being discharged due to financial reasons.  ?????  Pt requested to cease PT for financial reasons, and PT put pt on hold with the understanding that pt would call back to schedule additional PT within 2 weeks of last appointment, if he was able to afford it. Pt did not call  back, so PT is discharging pt. Pt met all STGs and one LTG (BERG balance score).         Analiza Cowger L 11/02/2014, 1:05 PM  [facility] [address] [place], [place], [address] Phone: [phone]   Fax:  [phone]   Geoffry Paradise, PT,DPT 11/02/2014 1:06 PM Phone: [phone] Fax: [phone]

## 2014-11-06 ENCOUNTER — Ambulatory Visit (INDEPENDENT_AMBULATORY_CARE_PROVIDER_SITE_OTHER): Payer: Federal, State, Local not specified - PPO | Admitting: Family Medicine

## 2014-11-06 ENCOUNTER — Encounter: Payer: Self-pay | Admitting: Family Medicine

## 2014-11-06 VITALS — BP 136/74 | HR 78 | Temp 98.3°F | Resp 16 | Ht 67.5 in | Wt 230.4 lb

## 2014-11-06 DIAGNOSIS — E1142 Type 2 diabetes mellitus with diabetic polyneuropathy: Secondary | ICD-10-CM

## 2014-11-06 DIAGNOSIS — E119 Type 2 diabetes mellitus without complications: Secondary | ICD-10-CM

## 2014-11-06 DIAGNOSIS — I10 Essential (primary) hypertension: Secondary | ICD-10-CM

## 2014-11-06 DIAGNOSIS — G118 Other hereditary ataxias: Secondary | ICD-10-CM

## 2014-11-06 DIAGNOSIS — Z720 Tobacco use: Secondary | ICD-10-CM

## 2014-11-06 DIAGNOSIS — E785 Hyperlipidemia, unspecified: Secondary | ICD-10-CM

## 2014-11-06 LAB — POCT GLYCOSYLATED HEMOGLOBIN (HGB A1C): HEMOGLOBIN A1C: 7

## 2014-11-06 NOTE — Progress Notes (Signed)
Subjective:    Patient ID: Randy Ballard, male    DOB: 12-Oct-1960, 54 y.o.   MRN: [id number]  HPI  This is a 54 year old male with PMH spinocerebellar ataxia type 3, HTN, DMII, HLD, hypothyroidism who is presenting for follow up DMII.  DMII: last A1C 3 months ago 6.6. On metformin 1000 mg BID and amaryl 8 mg QAM. Checks blood sugars twice a week. FBG 120s, postprandial 150s. Doing well with diet. Only drinks diet soda every now and then. Uses treadmill at home every three days. Walks 2 hours at [employer] 2 days a week. Doing exercises taught from PT every other day.   HTN: 2 weeks ago had high pressures at neurologist office d/t decongestants for cold symptoms. Was advised to be seen by PCP. Was seen here by Dr. Elder Cyphers. Normal pressures at that time and EKG normal. Dr. Elder Cyphers advised to stop decongestants. On lisinopril-HCTZ and toprol.  SCA-type 3: 6-8 sessions of PT, states helped a lot. Neurologist wants him to do more sessions but states he needs to wait to get his finances together. Goes to [employer] twice a week and walks for 2 hours working on gait. Does PT exercises at home every other day. Mom has been bedridden for past 10 years d/t same dz. He states he feels he doesn't have the disease as bad as her. States her sister has symptoms of disease as well but symptoms are not as bad. He does not have children. He denies problem with bladder/bowel, respiration or speech. He has problems with constipation "every now and then" but he has had this problem for many years. States since being diagnosed he has had bilateral finger and toe numbness.  Tobacco abuse: newly learned from his wife he smokes cigarettes. 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 When he stopped driving trucks he cut back to 1 ppd. He is trying to quit and is now smoking 1/2-3/4 ppd. Never been on any medications for cessation.  Review of Systems  Constitutional: Negative for fever and chills.  HENT:  Negative for trouble swallowing.   Eyes: Negative for visual disturbance.  Respiratory: Negative for shortness of breath.   Cardiovascular: Negative for chest pain and leg swelling.  Gastrointestinal: Negative for nausea, vomiting, abdominal pain and diarrhea.  Genitourinary: Negative for difficulty urinating.  Musculoskeletal: Positive for gait problem.  Neurological: Positive for weakness and numbness.    Patient Active Problem List   Diagnosis Date Noted  . Tobacco abuse 11/06/2014  . SCA-3 (spinocerebellar ataxia type 3) 12/17/2013  . Acute pancreatitis 04/16/2013  . Leukocytosis 04/16/2013  . BMI 34.0-34.9,adult 02/05/2012  . HTN (hypertension) 12/23/2011  . DM type 2 (diabetes mellitus, type 2) 12/23/2011  . Hypothyroid 12/23/2011  . Dyslipidemia 12/23/2011    Prior to Admission medications   Medication Sig Start Date End Date Taking? Authorizing Provider  aspirin 81 MG tablet Take 1 tablet (81 mg total) by mouth daily. 12/23/11  Yes Hayden Rasmussen, MD  atorvastatin (LIPITOR) 40 MG tablet Take 1 tablet (40 mg total) by mouth daily. 08/02/14  Yes Shawnee Knapp, MD  Blood Glucose Monitoring Suppl (BLOOD GLUCOSE METER) kit Test blood sugar daily 12/19/13  Yes Shawnee Knapp, MD  calcium-vitamin D (OSCAL WITH D) 500-200 MG-UNIT per tablet Take 1 tablet by mouth 2 (two) times daily.   Yes Historical Provider, MD  glimepiride (AMARYL) 4 MG tablet Take 2 tablets (8 mg total) by  mouth daily with breakfast. 07/03/14  Yes Shawnee Knapp, MD  glucose blood test strip Test blood sugar daily. 12/19/13  Yes Shawnee Knapp, MD  Lancet Devices (LANCING DEVICE) MISC Test blood sugar daily. 12/19/13  Yes Shawnee Knapp, MD  Lancets MISC Test blood sugar daily 12/19/13  Yes Shawnee Knapp, MD  levothyroxine (SYNTHROID) 125 MCG tablet Take 1 tablet (125 mcg total) by mouth daily before breakfast. 08/02/14  Yes Shawnee Knapp, MD  lisinopril-hydrochlorothiazide (PRINZIDE,ZESTORETIC) 20-25 MG per tablet Take 1 tablet by mouth  daily. 07/03/14  Yes Shawnee Knapp, MD  metFORMIN (GLUCOPHAGE) 1000 MG tablet Take 1 tablet (1,000 mg total) by mouth 2 (two) times daily with a meal. 07/03/14  Yes Shawnee Knapp, MD  metoprolol succinate (TOPROL-XL) 100 MG 24 hr tablet Take 1 tablet (100 mg total) by mouth daily. Take with or immediately following a meal. 07/03/14  Yes Shawnee Knapp, MD     No Known Allergies  Patient's social and family history were reviewed.     Objective:   Physical Exam  Constitutional: He is oriented to person, place, and time. He appears well-developed and well-nourished. No distress.  HENT:  Head: Normocephalic and atraumatic.  Right Ear: Hearing normal.  Left Ear: Hearing normal.  Nose: Nose normal.  Mouth/Throat: Oropharynx is clear and moist and mucous membranes are normal. Abnormal dentition (missing upper front teeth).  Eyes: Conjunctivae and lids are normal. Right eye exhibits no discharge. Left eye exhibits no discharge. No scleral icterus.  Cardiovascular: Normal rate, normal heart sounds, intact distal pulses and normal pulses.  An irregular rhythm present.  No murmur heard. Occasional skipped beats (documented PVCs per EKG)  Pulmonary/Chest: Effort normal and breath sounds normal. No respiratory distress. He has no wheezes. He has no rhonchi. He has no rales.  Abdominal: Soft. Normal appearance. There is no tenderness.  Neurological: He is alert and oriented to person, place, and time. He has normal strength. Gait abnormal.  See diabetic foot exam   Skin: Skin is warm, dry and intact. No lesion and no rash noted.  Psychiatric: He has a normal mood and affect. His speech is normal and behavior is normal. Thought content normal.   BP 136/74 mmHg  Pulse 78  Temp(Src) 98.3 F (36.8 C) (Oral)  Resp 16  Ht 5' 7.5" (1.715 m)  Wt 230 lb 6.4 oz (104.509 kg)  BMI 35.53 kg/m2  SpO2 99%     Assessment & Plan:  1. Diabetes mellitus without complication O7H 7.0 today up from 6.6 three months ago.  Will stay with current medications, no changes at this time. Follow up in 3 months. - HM Diabetes Foot Exam - POCT glycosylated hemoglobin (Hb A1C)  2. Essential hypertension Stable. Continue current medications.  3. SCA-3 (spinocerebellar ataxia type 3) Stable. Follow up with neurology.  4. Tobacco abuse Discussed the importance of cessation. Goal of cutting out 1 cigarette/day per week. Next visit goal of </= 5 cigarettes/day. Will discuss medications for smoking cessation next visit.   Benjaman Pott Drenda Freeze, MHS [facility]  11/06/2014

## 2014-11-17 MED ORDER — METOPROLOL SUCCINATE ER 100 MG PO TB24: 100.0000 mg | ORAL_TABLET | Freq: Every day | ORAL | Status: DC

## 2014-11-17 MED ORDER — LISINOPRIL-HYDROCHLOROTHIAZIDE 20-25 MG PO TABS: 1.0000 | ORAL_TABLET | Freq: Every day | ORAL | Status: DC

## 2014-11-17 MED ORDER — GLIMEPIRIDE 4 MG PO TABS: 8.0000 mg | ORAL_TABLET | Freq: Every day | ORAL | Status: DC

## 2014-11-17 MED ORDER — METFORMIN HCL 1000 MG PO TABS: 1000.0000 mg | ORAL_TABLET | Freq: Two times a day (BID) | ORAL | Status: DC

## 2014-11-17 MED ORDER — LEVOTHYROXINE SODIUM 125 MCG PO TABS: 125.0000 ug | ORAL_TABLET | Freq: Every day | ORAL | Status: DC

## 2014-11-17 MED ORDER — ATORVASTATIN CALCIUM 40 MG PO TABS: 40.0000 mg | ORAL_TABLET | Freq: Every day | ORAL | Status: DC

## 2014-11-17 NOTE — Progress Notes (Signed)
Diabetes mellitus without complication - Plan: HM Diabetes Foot Exam, POCT glycosylated hemoglobin (Hb A1C) - try to cont exercise, watch diet, as hgba1c increased today at 7.0 up from 6.6 4 mos prior - recheck in 4 mos.  Essential hypertension  SCA-3 (spinocerebellar ataxia type 3)  Tobacco abuse - encouraged to cut down - pt will try.  Hypothyroid - tsh stable for 1 yr - last checked 4 mos prior, recheck tsh at f/u, on levothyroxine 125.  Results for orders placed or performed in visit on 11/06/14  POCT glycosylated hemoglobin (Hb A1C)  Result Value Ref Range   Hemoglobin A1C 7.0      Pt assessed, reviewed documentation and agree w/ assessment and plan. Norberto SorensonEva Shaw, MD MPH

## 2015-01-20 ENCOUNTER — Ambulatory Visit (INDEPENDENT_AMBULATORY_CARE_PROVIDER_SITE_OTHER): Payer: Federal, State, Local not specified - PPO

## 2015-01-20 ENCOUNTER — Encounter: Payer: Self-pay | Admitting: Podiatry

## 2015-01-20 ENCOUNTER — Ambulatory Visit (INDEPENDENT_AMBULATORY_CARE_PROVIDER_SITE_OTHER): Payer: Federal, State, Local not specified - PPO | Admitting: Podiatry

## 2015-01-20 ENCOUNTER — Ambulatory Visit: Payer: Federal, State, Local not specified - PPO

## 2015-01-20 VITALS — BP 175/95 | HR 90 | Resp 18

## 2015-01-20 DIAGNOSIS — B351 Tinea unguium: Secondary | ICD-10-CM

## 2015-01-20 DIAGNOSIS — L84 Corns and callosities: Secondary | ICD-10-CM | POA: Diagnosis not present

## 2015-01-20 DIAGNOSIS — R52 Pain, unspecified: Secondary | ICD-10-CM

## 2015-01-20 DIAGNOSIS — E119 Type 2 diabetes mellitus without complications: Secondary | ICD-10-CM

## 2015-01-20 NOTE — Patient Instructions (Signed)
Over the counter treatment for nail fungus is called fungi-nail  

## 2015-01-20 NOTE — Progress Notes (Signed)
   Subjective:    Patient ID: Randy Ballard, male    DOB: 1961-08-11, 54 y.o.   MRN: [id number]  HPI  54 year old male presents the office today with complaints of painful, thick, elongated toenails which she is unable to trim himself. He is also quite possible toenail fungus. States he is diabetic his last HbA1c was 7.0. Denies a history of ulceration. No claudication symptoms. No other complaints at this time.  Review of Systems  All other systems reviewed and are negative.      Objective:   Physical Exam AAO 3, NAD DP/PT pulses palpable, CRT less than 3 seconds Protective sensation intact with Simms Weinstein monofilament, vibratory sensation intact, Achilles tendon reflex intact. Nails are hypertrophic, dystrophic, elongated, brittle, discolored 10. There is subjective tenderness along nails modified bilaterally. There is no surrounding erythema or drainage of the nail sites. Left foot submetatarsal 5 hyperkeratotic lesion. Upon debridement lesion there is no underlying ulceration, drainage, or clinical signs of infection. The skin was intact. No other open lesions or pre-ulcerative lesions. There is no other areas of tenderness to bilateral lower extremities. No amount edema, erythema, increase in warmth. No pain with calf compression, swelling, warmth, erythema.      Assessment & Plan:  54 year old male with symptomatic onychomycosis, hyperkeratotic lesion -Treatment options discussed including alternatives, risks, complications. -X-ray was obtained of the left foot. There is no evidence of foreign body, exostosis -Nail shop and debrided 10 without complication/putting. Discussed. Treatment options for onychomycosis with the patient. At this time if he proceeds of treatment he will likely start with over-the-counter treatment. -Hyperkeratotic lesions debrided 1 without complication/bleeding -Discussed daily foot inspection -Follow-up in 3 months or sooner if any problems  are to arise. In the meantime and cursory call the office they portions or concerns or any changes symptoms.

## 2015-01-25 ENCOUNTER — Encounter: Payer: Self-pay | Admitting: Podiatry

## 2015-01-29 ENCOUNTER — Encounter: Payer: Self-pay | Admitting: Family Medicine

## 2015-01-29 ENCOUNTER — Ambulatory Visit (INDEPENDENT_AMBULATORY_CARE_PROVIDER_SITE_OTHER): Payer: Federal, State, Local not specified - PPO | Admitting: Family Medicine

## 2015-01-29 VITALS — BP 138/70 | HR 75 | Temp 98.4°F | Resp 16 | Ht 68.0 in | Wt 225.2 lb

## 2015-01-29 DIAGNOSIS — K859 Acute pancreatitis, unspecified: Secondary | ICD-10-CM

## 2015-01-29 DIAGNOSIS — Z125 Encounter for screening for malignant neoplasm of prostate: Secondary | ICD-10-CM | POA: Diagnosis not present

## 2015-01-29 DIAGNOSIS — K5900 Constipation, unspecified: Secondary | ICD-10-CM | POA: Diagnosis not present

## 2015-01-29 DIAGNOSIS — E118 Type 2 diabetes mellitus with unspecified complications: Secondary | ICD-10-CM | POA: Diagnosis not present

## 2015-01-29 DIAGNOSIS — Z6834 Body mass index (BMI) 34.0-34.9, adult: Secondary | ICD-10-CM | POA: Diagnosis not present

## 2015-01-29 DIAGNOSIS — E559 Vitamin D deficiency, unspecified: Secondary | ICD-10-CM | POA: Diagnosis not present

## 2015-01-29 DIAGNOSIS — E785 Hyperlipidemia, unspecified: Secondary | ICD-10-CM | POA: Diagnosis not present

## 2015-01-29 DIAGNOSIS — I10 Essential (primary) hypertension: Secondary | ICD-10-CM

## 2015-01-29 DIAGNOSIS — Z79899 Other long term (current) drug therapy: Secondary | ICD-10-CM

## 2015-01-29 DIAGNOSIS — E032 Hypothyroidism due to medicaments and other exogenous substances: Secondary | ICD-10-CM

## 2015-01-29 DIAGNOSIS — G118 Other hereditary ataxias: Secondary | ICD-10-CM | POA: Diagnosis not present

## 2015-01-29 DIAGNOSIS — E1142 Type 2 diabetes mellitus with diabetic polyneuropathy: Secondary | ICD-10-CM

## 2015-01-29 DIAGNOSIS — Z720 Tobacco use: Secondary | ICD-10-CM | POA: Diagnosis not present

## 2015-01-29 LAB — COMPREHENSIVE METABOLIC PANEL
ALBUMIN: 4.5 g/dL (ref 3.5–5.2)
ALT: 37 U/L (ref 0–53)
AST: 32 U/L (ref 0–37)
Alkaline Phosphatase: 74 U/L (ref 39–117)
BUN: 16 mg/dL (ref 6–23)
CALCIUM: 9.7 mg/dL (ref 8.4–10.5)
CHLORIDE: 98 meq/L (ref 96–112)
CO2: 23 meq/L (ref 19–32)
Creat: 0.91 mg/dL (ref 0.50–1.35)
GLUCOSE: 149 mg/dL — AB (ref 70–99)
POTASSIUM: 4.1 meq/L (ref 3.5–5.3)
SODIUM: 136 meq/L (ref 135–145)
TOTAL PROTEIN: 7.3 g/dL (ref 6.0–8.3)
Total Bilirubin: 0.7 mg/dL (ref 0.2–1.2)

## 2015-01-29 LAB — POCT URINALYSIS DIPSTICK
BILIRUBIN UA: NEGATIVE
Blood, UA: NEGATIVE
GLUCOSE UA: 2590
Ketones, UA: NEGATIVE
LEUKOCYTES UA: NEGATIVE
NITRITE UA: NEGATIVE
PH UA: 5
Protein, UA: 30
Spec Grav, UA: 1.02
Urobilinogen, UA: 0.2

## 2015-01-29 LAB — POCT GLYCOSYLATED HEMOGLOBIN (HGB A1C): HEMOGLOBIN A1C: 7.7

## 2015-01-29 LAB — GLUCOSE, POCT (MANUAL RESULT ENTRY): POC Glucose: 177 mg/dl — AB (ref 70–99)

## 2015-01-29 LAB — LIPID PANEL
CHOL/HDL RATIO: 3.9 ratio
Cholesterol: 101 mg/dL (ref 0–200)
HDL: 26 mg/dL — ABNORMAL LOW (ref >=40)
LDL CALC: 45 mg/dL (ref 0–99)
Triglycerides: 150 mg/dL — ABNORMAL HIGH (ref <150)
VLDL: 30 mg/dL (ref 0–40)

## 2015-01-29 LAB — MICROALBUMIN / CREATININE URINE RATIO
Creatinine, Urine: 179.9 mg/dL
Microalb Creat Ratio: 44.5 mg/g — ABNORMAL HIGH (ref 0.0–30.0)
Microalb, Ur: 8 mg/dL — ABNORMAL HIGH (ref <2.0)

## 2015-01-29 MED ORDER — SENNOSIDES-DOCUSATE SODIUM 8.6-50 MG PO TABS: 2.0000 | ORAL_TABLET | Freq: Every day | ORAL | Status: DC

## 2015-01-29 NOTE — Progress Notes (Signed)
Subjective:    Patient ID: Randy Ballard, male    DOB: 05/15/1961, 54 y.o.   MRN: [id number] This chart was scribed for [name], MD by Zola Button, Medical Scribe. This patient was seen in Room 24 and the patient's care was started at 11:11 AM.   Chief Complaint  Patient presents with  . Diabetes  . Hypertension  . Hyperlipidemia  . Abdominal Pain    having a bout of pancreas pain but doing better now    HPI HPI Comments: Randy Ballard is a 54 y.o. male with a hx of hypothyroid, DM, HTN and HLD who presents to the [facility] for a follow-up.  Saw him 3 months ago. A1C had increased from 7.0 from 6.6. Patient recommended to work on diet and exercise. He ate some crackers and coffee with cream, but unsweetened, so he could take his medications. He was trying to cut down smoking with a plan of cutting down 1 cigarette each week, with goal of less than 5 cigarettes a day by this visit. Blood work last done 7 months ago. Has been stable on his dose of levothyroxine for over a year. Cholesterol was at goal and kidneys and liver were normal. He is overdue for urinalysis. Patient had a normal anemia workup over 2 years ago, normal B12, folate, and iron. Last PSA was 3 years ago. History of vitamin D deficiency 3 years ago, which was 63.    General Health: Patient and his wife have been doing well on smoking cessation; he has not had a cigarette since this past Monday (4 days ago). He does not see a urologist. He has been taking OTC vitamin D. Patient states his diet has been good. He notes variable CBG reading with readings including 135 and 145. He often forgets to measure his CBG in the mornings. Patient denies any recent hypoglycemic symptoms; he has not had problems with hypoglycemia for a few years. He states he has been urinating fine, but sometimes has 1-2 episodes of nocturia. He has been exercising some, walking on a treadmill and uses a push mower when cutting  grass.  Left Ankle Pain: Patient went to the podiatrist recently for intermittent left ankle pain. He was told he had some arthritis. The ankle pain sometimes interrupts his exercising and activity, about twice a week. He has taken acetaminophen sometimes for the pain, which sometimes provides relief.  Abdominal Pain: Patient did have some abdominal pain and nausea last week. He states it was a flare-up of pancreatitis and attributed the symptoms due to some chicken he had at [employer]. He still has some abdominal discomfort attributed to not having a good bowel movement for the past few days. Patient has been taking Miralax and Dulcolax PO to help with bowel movements. He notes the Dulcolax is more effective for him.  Past Medical History  Diagnosis Date  . Diabetes mellitus without complication   . Hyperlipidemia   . Hypertension   . Hypogonadism male   . 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 Diverticulitis   . SCA-3 (spinocerebellar ataxia type 3)    Current Outpatient Prescriptions on File Prior to Visit  Medication Sig Dispense Refill  . aspirin 81 MG tablet Take 1 tablet (81 mg total) by mouth daily. 30 tablet 11  . atorvastatin (LIPITOR) 40 MG tablet Take 1 tablet (40 mg total) by mouth daily. 30 tablet 5  .  Blood Glucose Monitoring Suppl (BLOOD GLUCOSE METER) kit Test blood sugar daily 1 each 0  . calcium-vitamin D (OSCAL WITH D) 500-200 MG-UNIT per tablet Take 1 tablet by mouth 2 (two) times daily.    Marland Kitchen glimepiride (AMARYL) 4 MG tablet Take 2 tablets (8 mg total) by mouth daily with breakfast. 60 tablet 5  . glucose blood test strip Test blood sugar daily. 100 each 3  . Lancet Devices (LANCING DEVICE) MISC Test blood sugar daily. 1 each 0  . Lancets MISC Test blood sugar daily 100 each 3  . levothyroxine (SYNTHROID) 125 MCG tablet Take 1 tablet (125 mcg total) by mouth daily before breakfast. 30 tablet 5  . lisinopril-hydrochlorothiazide  (PRINZIDE,ZESTORETIC) 20-25 MG per tablet Take 1 tablet by mouth daily. 30 tablet 5  . metFORMIN (GLUCOPHAGE) 1000 MG tablet Take 1 tablet (1,000 mg total) by mouth 2 (two) times daily with a meal. 60 tablet 5  . metoprolol succinate (TOPROL-XL) 100 MG 24 hr tablet Take 1 tablet (100 mg total) by mouth daily. Take with or immediately following a meal. 30 tablet 5   No current facility-administered medications on file prior to visit.   No Known Allergies   Review of Systems  Constitutional: Positive for diaphoresis. Negative for fever, chills, activity change, appetite change and unexpected weight change.  HENT: Positive for sore throat and trouble swallowing.   Gastrointestinal: Positive for abdominal pain and constipation. Negative for nausea and vomiting.  Genitourinary: Negative for urgency, hematuria, enuresis and difficulty urinating.  Musculoskeletal: Positive for arthralgias.  Neurological: Positive for weakness. Negative for tremors, syncope, speech difficulty and numbness.  Psychiatric/Behavioral: Negative for dysphoric mood. The patient is not nervous/anxious.        Objective:  BP 172/83 mmHg  Pulse 75  Temp(Src) 98.4 F (36.9 C) (Oral)  Resp 16  Ht '5\' 8"'  (1.727 m)  Wt 225 lb 3.2 oz (102.15 kg)  BMI 34.25 kg/m2  SpO2 99%  Physical Exam  Constitutional: He is oriented to person, place, and time. He appears well-developed and well-nourished. No distress.  HENT:  Head: Normocephalic and atraumatic.  Mouth/Throat: Oropharynx is clear and moist. No oropharyngeal exudate.  Eyes: Pupils are equal, round, and reactive to light.  Neck: Neck supple. No thyromegaly present.  Normal thyroid.  Cardiovascular: Normal rate, regular rhythm, S1 normal, S2 normal and normal heart sounds.   No murmur heard. Repeat blood pressure: 138/70.  Pulmonary/Chest: Effort normal and breath sounds normal. No respiratory distress. He has no wheezes. He has no rales.  CTAB.  Musculoskeletal: He  exhibits no edema.  Lymphadenopathy:    He has no cervical adenopathy.  Neurological: He is alert and oriented to person, place, and time. No cranial nerve deficit.  Skin: Skin is warm and dry. No rash noted.  1cm x 2cm fluctuant area on left lower anterior lateral leg.  Psychiatric: He has a normal mood and affect. His behavior is normal.  Vitals reviewed.         Assessment & Plan:  hg Hold him to have pharmacy request refills when medications run out.  bA1c had increased to 7.7. Patient has been eating more doughnuts; advised him to cut back . Re-check HbA1c at follow-up. T  F/u in 4 mos for CPE  1. Essential hypertension   2. Type 2 diabetes mellitus with complication   3. 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 SCA-3 (spinocerebellar ataxia type 3)   5. Dyslipidemia   6. BMI 34.0-34.9,adult  7. Tobacco abuse - AMAZING - pt has been able to cut way down and has had several days that he didn't smoke at all  8. Polypharmacy   9. Vitamin D deficiency   10. Screening for prostate cancer   11. Acute pancreatitis, unspecified pancreatitis type - h/o pancreatitis poss due to [place] - pt having some mild current pains similar - increase H20, low fat diet instructions given  12. Type 2 diabetes mellitus with diabetic polyneuropathy   13. Essential hypertension, benign   14. Constipation, unspecified constipation type - daily miralax    Orders Placed This Encounter  Procedures  . Comprehensive metabolic panel    Order Specific Question:  Has the patient fasted?    Answer:  Yes  . Lipid panel    Order Specific Question:  Has the patient fasted?    Answer:  Yes  . TSH  . Microalbumin/Creatinine Ratio, Urine  . PSA  . Vit D  25 hydroxy (rtn osteoporosis monitoring)  . Lipase  . POCT glucose (manual entry)  . POCT glycosylated hemoglobin (Hb A1C)  . POCT urinalysis dipstick  . HM Diabetes Foot Exam    Meds ordered this encounter  Medications  .  senna-docusate (SENOKOT S) 8.6-50 MG per tablet    Sig: Take 2 tablets by mouth at bedtime.    Dispense:  60 tablet    Refill:  1    I personally performed the services described in this documentation, which was scribed in my presence. The recorded information has been reviewed and considered, and addended by me as needed.  [name], MD MPH

## 2015-01-29 NOTE — Patient Instructions (Addendum)
I am so proud of you - you are doing a great job!  Why don't you come back in about 4 months for a full physical. Osteoarthritis Osteoarthritis is a disease that causes soreness and inflammation of a joint. It occurs when the cartilage at the affected joint wears down. Cartilage acts as a cushion, covering the ends of bones where they meet to form a joint. Osteoarthritis is the most common form of arthritis. It often occurs in older people. The joints affected most often by this condition include those in the:  Ends of the fingers.  Thumbs.  Neck.  Lower back.  Knees.  Hips. CAUSES  Over time, the cartilage that covers the ends of bones begins to wear away. This causes bone to rub on bone, producing pain and stiffness in the affected joints.  RISK FACTORS Certain factors can increase your chances of having osteoarthritis, including:  Older age.  Excessive body weight.  Overuse of joints.  Previous joint injury. SIGNS AND SYMPTOMS   Pain, swelling, and stiffness in the joint.  Over time, the joint may lose its normal shape.  Small deposits of bone (osteophytes) may grow on the edges of the joint.  Bits of bone or cartilage can break off and float inside the joint space. This may cause more pain and damage. DIAGNOSIS  Your health care provider will do a physical exam and ask about your symptoms. Various tests may be ordered, such as:  X-rays of the affected joint.  An MRI scan.  Blood tests to rule out other types of arthritis.  Joint fluid tests. This involves using a needle to draw fluid from the joint and examining the fluid under a microscope. TREATMENT  Goals of treatment are to control pain and improve joint function. Treatment plans may include:  A prescribed exercise program that allows for rest and joint relief.  A weight control plan.  Pain relief techniques, such as:  Properly applied heat and cold.  Electric pulses delivered to nerve endings under  the skin (transcutaneous electrical nerve stimulation [TENS]).  Massage.  Certain nutritional supplements.  Medicines to control pain, such as:  Acetaminophen.  Nonsteroidal anti-inflammatory drugs (NSAIDs), such as naproxen.  Narcotic or central-acting agents, such as tramadol.  Corticosteroids. These can be given orally or as an injection.  Surgery to reposition the bones and relieve pain (osteotomy) or to remove loose pieces of bone and cartilage. Joint replacement may be needed in advanced states of osteoarthritis. HOME CARE INSTRUCTIONS   Take medicines only as directed by your health care provider.  Maintain a healthy weight. Follow your health care provider's instructions for weight control. This may include dietary instructions.  Exercise as directed. Your health care provider can recommend specific types of exercise. These may include:  Strengthening exercises. These are done to strengthen the muscles that support joints affected by arthritis. They can be performed with weights or with exercise bands to add resistance.  Aerobic activities. These are exercises, such as brisk walking or low-impact aerobics, that get your heart pumping.  Range-of-motion activities. These keep your joints limber.  Balance and agility exercises. These help you maintain daily living skills.  Rest your affected joints as directed by your health care provider.  Keep all follow-up visits as directed by your health care provider. SEEK MEDICAL CARE IF:   Your skin turns red.  You develop a rash in addition to your joint pain.  You have worsening joint pain.  You have a fever along  with joint or muscle aches. SEEK IMMEDIATE MEDICAL CARE IF:  You have a significant loss of weight or appetite.  You have night sweats. FOR MORE INFORMATION   National Institute of Arthritis and Musculoskeletal and Skin Diseases: www.niams.http://www.myers.net/nih.gov  [employer] on Aging: https://walker.com/www.nia.nih.gov  American  College of Rheumatology: www.rheumatology.org Document Released: 09/25/2005 Document Revised: 02/09/2014 Document Reviewed: 06/02/2013 [facility] Patient Information 2015 [place]Care, [place]. This information is not intended to replace advice given to you by your health care provider. Make sure you discuss any questions you have with your health care provider.  Low-Fat Diet for Pancreatitis or Gallbladder Conditions A low-fat diet can be helpful if you have pancreatitis or a gallbladder condition. With these conditions, your pancreas and gallbladder have trouble digesting fats. A healthy eating plan with less fat will help rest your pancreas and gallbladder and reduce your symptoms. WHAT DO I NEED TO KNOW ABOUT THIS DIET?  Eat a low-fat diet.  Reduce your fat intake to less than 20-30% of your total daily calories. This is less than 50-60 g of fat per day.  Remember that you need some fat in your diet. Ask your dietician what your daily goal should be.  Choose nonfat and low-fat healthy foods. Look for the words "nonfat," "low fat," or "fat free."  As a guide, look on the label and choose foods with less than 3 g of fat per serving. Eat only one serving.  Avoid alcohol.  Do not smoke. If you need help quitting, talk with your health care provider.  Eat small frequent meals instead of three large heavy meals. WHAT FOODS CAN I EAT? Grains Include healthy grains and starches such as potatoes, wheat bread, fiber-rich cereal, and brown rice. Choose whole grain options whenever possible. In adults, whole grains should account for 45-65% of your daily calories.  Fruits and Vegetables Eat plenty of fruits and vegetables. Fresh fruits and vegetables add fiber to your diet. Meats and Other Protein Sources Eat lean meat such as chicken and pork. Trim any fat off of meat before cooking it. Eggs, fish, and beans are other sources of protein. In adults, these foods should account for 10-35% of your daily  calories. Dairy Choose low-fat milk and dairy options. Dairy includes fat and protein, as well as calcium.  Fats and Oils Limit high-fat foods such as fried foods, sweets, baked goods, sugary drinks.  Other Creamy sauces and condiments, such as mayonnaise, can add extra fat. Think about whether or not you need to use them, or use smaller amounts or low fat options. WHAT FOODS ARE NOT RECOMMENDED?  High fat foods, such as:  Tesoro CorporationBaked goods.  Ice cream.  [place] toast.  Sweet rolls.  Pizza.  Cheese bread.  Foods covered with batter, butter, creamy sauces, or cheese.  Fried foods.  Sugary drinks and desserts.  Foods that cause gas or bloating Document Released: 09/30/2013 Document Reviewed: 09/30/2013 [facility] Thomas[facility]Care Patient Information 2015 [place]ExitCare, [place]LLC. This information is not intended to replace advice given to you by your health care provider. Make sure you discuss any questions you have with your health care provider.

## 2015-01-30 LAB — LIPASE: LIPASE: 42 U/L (ref 0–75)

## 2015-01-30 LAB — TSH: TSH: 3.094 u[IU]/mL (ref 0.350–4.500)

## 2015-01-30 LAB — PSA: PSA: 3.57 ng/mL (ref <=4.00)

## 2015-01-30 LAB — VITAMIN D 25 HYDROXY (VIT D DEFICIENCY, FRACTURES): Vit D, 25-Hydroxy: 25 ng/mL — ABNORMAL LOW (ref 30–100)

## 2015-04-23 ENCOUNTER — Ambulatory Visit (INDEPENDENT_AMBULATORY_CARE_PROVIDER_SITE_OTHER): Payer: Federal, State, Local not specified - PPO | Admitting: Podiatry

## 2015-04-23 ENCOUNTER — Encounter: Payer: Self-pay | Admitting: Podiatry

## 2015-04-23 VITALS — BP 172/92 | HR 77 | Resp 18

## 2015-04-23 DIAGNOSIS — B351 Tinea unguium: Secondary | ICD-10-CM | POA: Diagnosis not present

## 2015-04-23 DIAGNOSIS — R52 Pain, unspecified: Secondary | ICD-10-CM

## 2015-04-23 DIAGNOSIS — M79673 Pain in unspecified foot: Secondary | ICD-10-CM | POA: Diagnosis not present

## 2015-04-26 ENCOUNTER — Encounter: Payer: Self-pay | Admitting: Podiatry

## 2015-04-26 NOTE — Progress Notes (Signed)
Patient ID: Randy Ballard, male   DOB: 1961/01/20, 54 y.o.   MRN: [id number]  Subjective: 54 y.o. male returns the office today for painful, elongated, thickened toenails which he is unable to trim himself. Denies any redness or drainage around the nails. Denies any acute changes since last appointment and no new complaints today. Denies any systemic complaints such as fevers, chills, nausea, vomiting.   Objective: AAO 3, NAD DP/PT pulses palpable, CRT less than 3 seconds Protective sensation intact with Simms Weinstein monofilament, Achilles tendon reflex intact.  Nails hypertrophic, dystrophic, elongated, brittle, discolored 10. There is tenderness overlying the nails 1-5 bilaterally. There is no surrounding erythema or drainage along the nail sites. No open lesions or pre-ulcerative lesions are identified. No other areas of tenderness bilateral lower extremities. No overlying edema, erythema, increased warmth. No pain with calf compression, swelling, warmth, erythema.  Assessment: Patient presents with symptomatic onychomycosis  Plan: -Treatment options including alternatives, risks, complications were discussed -Nails sharply debrided 10 without complication/bleeding. -Discussed daily foot inspection. If there are any changes, to call the office immediately.  -Follow-up in 3 months or sooner if any problems are to arise. In the meantime, encouraged to call the office with any questions, concerns, changes symptoms.   Ovid CurdMatthew Ballard, DPM

## 2015-05-25 ENCOUNTER — Other Ambulatory Visit: Payer: Self-pay | Admitting: Family Medicine

## 2015-06-10 ENCOUNTER — Ambulatory Visit (INDEPENDENT_AMBULATORY_CARE_PROVIDER_SITE_OTHER): Payer: Federal, State, Local not specified - PPO | Admitting: Family Medicine

## 2015-06-10 ENCOUNTER — Encounter: Payer: Self-pay | Admitting: Family Medicine

## 2015-06-10 VITALS — BP 130/72 | HR 87 | Temp 98.3°F | Resp 16 | Ht 66.75 in | Wt 221.2 lb

## 2015-06-10 DIAGNOSIS — E559 Vitamin D deficiency, unspecified: Secondary | ICD-10-CM | POA: Diagnosis not present

## 2015-06-10 DIAGNOSIS — Z1389 Encounter for screening for other disorder: Secondary | ICD-10-CM | POA: Diagnosis not present

## 2015-06-10 DIAGNOSIS — Z1383 Encounter for screening for respiratory disorder NEC: Secondary | ICD-10-CM

## 2015-06-10 DIAGNOSIS — Z6834 Body mass index (BMI) 34.0-34.9, adult: Secondary | ICD-10-CM

## 2015-06-10 DIAGNOSIS — Z125 Encounter for screening for malignant neoplasm of prostate: Secondary | ICD-10-CM

## 2015-06-10 DIAGNOSIS — Z23 Encounter for immunization: Secondary | ICD-10-CM | POA: Diagnosis not present

## 2015-06-10 DIAGNOSIS — Z130 Encounter for screening for diseases of the blood and blood-forming organs and certain disorders involving the immune mechanism: Secondary | ICD-10-CM

## 2015-06-10 DIAGNOSIS — Z1211 Encounter for screening for malignant neoplasm of colon: Secondary | ICD-10-CM | POA: Diagnosis not present

## 2015-06-10 DIAGNOSIS — Z0000 Encounter for general adult medical examination without abnormal findings: Secondary | ICD-10-CM | POA: Diagnosis not present

## 2015-06-10 DIAGNOSIS — R140 Abdominal distension (gaseous): Secondary | ICD-10-CM

## 2015-06-10 DIAGNOSIS — E785 Hyperlipidemia, unspecified: Secondary | ICD-10-CM

## 2015-06-10 DIAGNOSIS — G118 Other hereditary ataxias: Secondary | ICD-10-CM

## 2015-06-10 DIAGNOSIS — Q6100 Congenital renal cyst, unspecified: Secondary | ICD-10-CM

## 2015-06-10 DIAGNOSIS — E032 Hypothyroidism due to medicaments and other exogenous substances: Secondary | ICD-10-CM

## 2015-06-10 DIAGNOSIS — Z113 Encounter for screening for infections with a predominantly sexual mode of transmission: Secondary | ICD-10-CM | POA: Diagnosis not present

## 2015-06-10 DIAGNOSIS — N400 Enlarged prostate without lower urinary tract symptoms: Secondary | ICD-10-CM

## 2015-06-10 DIAGNOSIS — R351 Nocturia: Secondary | ICD-10-CM

## 2015-06-10 DIAGNOSIS — R972 Elevated prostate specific antigen [PSA]: Secondary | ICD-10-CM

## 2015-06-10 DIAGNOSIS — N281 Cyst of kidney, acquired: Secondary | ICD-10-CM

## 2015-06-10 DIAGNOSIS — Z136 Encounter for screening for cardiovascular disorders: Secondary | ICD-10-CM

## 2015-06-10 DIAGNOSIS — N401 Enlarged prostate with lower urinary tract symptoms: Secondary | ICD-10-CM

## 2015-06-10 DIAGNOSIS — Z1212 Encounter for screening for malignant neoplasm of rectum: Secondary | ICD-10-CM

## 2015-06-10 DIAGNOSIS — I10 Essential (primary) hypertension: Secondary | ICD-10-CM

## 2015-06-10 DIAGNOSIS — R634 Abnormal weight loss: Secondary | ICD-10-CM

## 2015-06-10 DIAGNOSIS — Z720 Tobacco use: Secondary | ICD-10-CM | POA: Diagnosis not present

## 2015-06-10 DIAGNOSIS — E118 Type 2 diabetes mellitus with unspecified complications: Secondary | ICD-10-CM | POA: Diagnosis not present

## 2015-06-10 DIAGNOSIS — R1902 Left upper quadrant abdominal swelling, mass and lump: Secondary | ICD-10-CM

## 2015-06-10 DIAGNOSIS — B353 Tinea pedis: Secondary | ICD-10-CM

## 2015-06-10 DIAGNOSIS — E1142 Type 2 diabetes mellitus with diabetic polyneuropathy: Secondary | ICD-10-CM | POA: Diagnosis not present

## 2015-06-10 LAB — COMPREHENSIVE METABOLIC PANEL
ALT: 31 U/L (ref 9–46)
AST: 23 U/L (ref 10–35)
Albumin: 4.5 g/dL (ref 3.6–5.1)
Alkaline Phosphatase: 77 U/L (ref 40–115)
BILIRUBIN TOTAL: 0.6 mg/dL (ref 0.2–1.2)
BUN: 16 mg/dL (ref 7–25)
CALCIUM: 9.8 mg/dL (ref 8.6–10.3)
CO2: 28 mmol/L (ref 20–31)
Chloride: 100 mmol/L (ref 98–110)
Creat: 0.9 mg/dL (ref 0.70–1.33)
Glucose, Bld: 210 mg/dL — ABNORMAL HIGH (ref 65–99)
Potassium: 4.5 mmol/L (ref 3.5–5.3)
Sodium: 139 mmol/L (ref 135–146)
Total Protein: 7.4 g/dL (ref 6.1–8.1)

## 2015-06-10 LAB — CBC
HEMATOCRIT: 37.5 % — AB (ref 39.0–52.0)
Hemoglobin: 12.7 g/dL — ABNORMAL LOW (ref 13.0–17.0)
MCH: 30.3 pg (ref 26.0–34.0)
MCHC: 33.9 g/dL (ref 30.0–36.0)
MCV: 89.5 fL (ref 78.0–100.0)
MPV: 11 fL (ref 8.6–12.4)
Platelets: 301 10*3/uL (ref 150–400)
RBC: 4.19 MIL/uL — ABNORMAL LOW (ref 4.22–5.81)
RDW: 12.5 % (ref 11.5–15.5)
WBC: 9.6 10*3/uL (ref 4.0–10.5)

## 2015-06-10 LAB — POCT URINALYSIS DIPSTICK
BILIRUBIN UA: NEGATIVE
GLUCOSE UA: 500
LEUKOCYTES UA: NEGATIVE
NITRITE UA: NEGATIVE
Protein, UA: 30
Spec Grav, UA: 1.025
UROBILINOGEN UA: 0.2
pH, UA: 5

## 2015-06-10 LAB — LIPID PANEL
CHOLESTEROL: 122 mg/dL — AB (ref 125–200)
HDL: 30 mg/dL — ABNORMAL LOW (ref >=40)
LDL Cholesterol: 66 mg/dL (ref <130)
TRIGLYCERIDES: 129 mg/dL (ref <150)
Total CHOL/HDL Ratio: 4.1 Ratio (ref <=5.0)
VLDL: 26 mg/dL (ref <30)

## 2015-06-10 LAB — HEPATITIS C ANTIBODY: HCV Ab: NEGATIVE

## 2015-06-10 LAB — HIV ANTIBODY (ROUTINE TESTING W REFLEX): HIV: NONREACTIVE

## 2015-06-10 LAB — TSH: TSH: 2.343 u[IU]/mL (ref 0.350–4.500)

## 2015-06-10 LAB — POCT GLYCOSYLATED HEMOGLOBIN (HGB A1C): Hemoglobin A1C: 7.7

## 2015-06-10 MED ORDER — LISINOPRIL-HYDROCHLOROTHIAZIDE 20-25 MG PO TABS: 1.0000 | ORAL_TABLET | Freq: Every day | ORAL | Status: DC

## 2015-06-10 MED ORDER — METFORMIN HCL 1000 MG PO TABS
1000.0000 mg | ORAL_TABLET | Freq: Two times a day (BID) | ORAL | Status: DC
Start: 2015-06-10 — End: 2015-11-25

## 2015-06-10 MED ORDER — NYSTATIN 100000 UNIT/GM EX POWD: 5.0000 g | Freq: Two times a day (BID) | CUTANEOUS | Status: DC

## 2015-06-10 MED ORDER — GLIMEPIRIDE 4 MG PO TABS: 8.0000 mg | ORAL_TABLET | Freq: Every day | ORAL | Status: DC

## 2015-06-10 NOTE — Patient Instructions (Addendum)
Get your shingles vaccine if your insurance covers it. Take vitamin D daily. [facility] is now offering annual lung cancer screening by low-dose CT scan.  This is covered for qualifying patients and your insurance will be checked before the procedure.  Call the lung cancer screening nurse navigators at [phone] to learn more about this and get scheduled.  Health Maintenance A healthy lifestyle and preventative care can promote health and wellness.  Maintain regular health, dental, and eye exams.  Eat a healthy diet. Foods like vegetables, fruits, whole grains, low-fat dairy products, and lean protein foods contain the nutrients you need and are low in calories. Decrease your intake of foods high in solid fats, added sugars, and salt. Get information about a proper diet from your health care provider, if necessary.  Regular physical exercise is one of the most important things you can do for your health. Most adults should get at least 150 minutes of moderate-intensity exercise (any activity that increases your heart rate and causes you to sweat) each week. In addition, most adults need muscle-strengthening exercises on 2 or more days a week.   Maintain a healthy weight. The body mass index (BMI) is a screening tool to identify possible weight problems. It provides an estimate of body fat based on height and weight. Your health care provider can find your BMI and can help you achieve or maintain a healthy weight. For males 20 years and older:  A BMI below 18.5 is considered underweight.  A BMI of 18.5 to 24.9 is normal.  A BMI of 25 to 29.9 is considered overweight.  A BMI of 30 and above is considered obese.  Maintain normal blood lipids and cholesterol by exercising and minimizing your intake of saturated fat. Eat a balanced diet with plenty of fruits and vegetables. Blood tests for lipids and cholesterol should begin at age 4 and be repeated every 5 years. If your lipid or cholesterol  levels are high, you are over age 77, or you are at high risk for heart disease, you may need your cholesterol levels checked more frequently.Ongoing high lipid and cholesterol levels should be treated with medicines if diet and exercise are not working.  If you smoke, find out from your health care provider how to quit. If you do not use tobacco, do not start.  Lung cancer screening is recommended for adults aged 55-80 years who are at high risk for developing lung cancer because of a history of smoking. A yearly low-dose CT scan of the lungs is recommended for people who have at least a 30-pack-year history of smoking and are current smokers or have quit within the past 15 years. A pack year of smoking is smoking an average of 1 pack of cigarettes a day for 1 year (for example, a 30-pack-year history of smoking could mean smoking 1 pack a day for 30 years or 2 packs a day for 15 years). Yearly screening should continue until the smoker has stopped smoking for at least 15 years. Yearly screening should be stopped for people who develop a health problem that would prevent them from having lung cancer treatment.  If you choose to drink alcohol, do not have more than 2 drinks per day. One drink is considered to be 12 oz (360 mL) of beer, 5 oz (150 mL) of wine, or 1.5 oz (45 mL) of liquor.  Avoid the use of street drugs. Do not share needles with anyone. Ask for help if you need support  or instructions about stopping the use of drugs.  High blood pressure causes heart disease and increases the risk of stroke. Blood pressure should be checked at least every 1-2 years. Ongoing high blood pressure should be treated with medicines if weight loss and exercise are not effective.  If you are 53-20 years old, ask your health care provider if you should take aspirin to prevent heart disease.  Diabetes screening involves taking a blood sample to check your fasting blood sugar level. This should be done once every  3 years after age 73 if you are at a normal weight and without risk factors for diabetes. Testing should be considered at a younger age or be carried out more frequently if you are overweight and have at least 1 risk factor for diabetes.  Colorectal cancer can be detected and often prevented. Most routine colorectal cancer screening begins at the age of 30 and continues through age 47. However, your health care provider may recommend screening at an earlier age if you have risk factors for colon cancer. On a yearly basis, your health care provider may provide home test kits to check for hidden blood in the stool. A small camera at the end of a tube may be used to directly examine the colon (sigmoidoscopy or colonoscopy) to detect the earliest forms of colorectal cancer. Talk to your health care provider about this at age 21 when routine screening begins. A direct exam of the colon should be repeated every 5-10 years through age 13, unless early forms of precancerous polyps or small growths are found.  People who are at an increased risk for hepatitis B should be screened for this virus. You are considered at high risk for hepatitis B if:  You were born in a country where hepatitis B occurs often. Talk with your health care provider about which countries are considered high risk.  Your parents were born in a high-risk country and you have not received a shot to protect against hepatitis B (hepatitis B vaccine).  You have HIV or AIDS.  You use needles to inject street drugs.  You live with, or have sex with, someone who has hepatitis B.  You are a man who has sex with other men (MSM).  You get hemodialysis treatment.  You take certain medicines for conditions like cancer, organ transplantation, and autoimmune conditions.  Hepatitis C blood testing is recommended for all people born from [birth year] through 1966 and any individual with known risk factors for hepatitis C.  Healthy men should no longer  receive prostate-specific antigen (PSA) blood tests as part of routine cancer screening. Talk to your health care provider about prostate cancer screening.  Testicular cancer screening is not recommended for adolescents or adult males who have no symptoms. Screening includes self-exam, a health care provider exam, and other screening tests. Consult with your health care provider about any symptoms you have or any concerns you have about testicular cancer.  Practice safe sex. Use condoms and avoid high-risk sexual practices to reduce the spread of sexually transmitted infections (STIs).  You should be screened for STIs, including gonorrhea and chlamydia if:  You are sexually active and are younger than 24 years.  You are older than 24 years, and your health care provider tells you that you are at risk for this type of infection.  Your sexual activity has changed since you were last screened, and you are at an increased risk for chlamydia or gonorrhea. Ask your health care provider  if you are at risk.  If you are at risk of being infected with HIV, it is recommended that you take a prescription medicine daily to prevent HIV infection. This is called pre-exposure prophylaxis (PrEP). You are considered at risk if:  You are a man who has sex with other men (MSM).  You are a heterosexual man who is sexually active with multiple partners.  You take drugs by injection.  You are sexually active with a partner who has HIV.  Talk with your health care provider about whether you are at high risk of being infected with HIV. If you choose to begin PrEP, you should first be tested for HIV. You should then be tested every 3 months for as long as you are taking PrEP.  Use sunscreen. Apply sunscreen liberally and repeatedly throughout the day. You should seek shade when your shadow is shorter than you. Protect yourself by wearing long sleeves, pants, a wide-brimmed hat, and sunglasses year round whenever you  are outdoors.  Tell your health care provider of new moles or changes in moles, especially if there is a change in shape or color. Also, tell your health care provider if a mole is larger than the size of a pencil eraser.  A one-time screening for abdominal aortic aneurysm (AAA) and surgical repair of large AAAs by ultrasound is recommended for men aged 65-75 years who are current or former smokers.  Stay current with your vaccines (immunizations). Document Released: 03/23/2008 Document Revised: 09/30/2013 Document Reviewed: 02/20/2011 [facility] Patient Information 2015 [place], [place]. This information is not intended to replace advice given to you by your health care provider. Make sure you discuss any questions you have with your health care provider.  Low-Fat Diet for Pancreatitis or Gallbladder Conditions A low-fat diet can be helpful if you have pancreatitis or a gallbladder condition. With these conditions, your pancreas and gallbladder have trouble digesting fats. A healthy eating plan with less fat will help rest your pancreas and gallbladder and reduce your symptoms. WHAT DO I NEED TO KNOW ABOUT THIS DIET?  Eat a low-fat diet.  Reduce your fat intake to less than 20-30% of your total daily calories. This is less than 50-60 g of fat per day.  Remember that you need some fat in your diet. Ask your dietician what your daily goal should be.  Choose nonfat and low-fat healthy foods. Look for the words "nonfat," "low fat," or "fat free."  As a guide, look on the label and choose foods with less than 3 g of fat per serving. Eat only one serving.  Avoid alcohol.  Do not smoke. If you need help quitting, talk with your health care provider.  Eat small frequent meals instead of three large heavy meals. WHAT FOODS CAN I EAT? Grains Include healthy grains and starches such as potatoes, wheat bread, fiber-rich cereal, and brown rice. Choose whole grain options whenever possible. In adults,  whole grains should account for 45-65% of your daily calories.  Fruits and Vegetables Eat plenty of fruits and vegetables. Fresh fruits and vegetables add fiber to your diet. Meats and Other Protein Sources Eat lean meat such as chicken and pork. Trim any fat off of meat before cooking it. Eggs, fish, and beans are other sources of protein. In adults, these foods should account for 10-35% of your daily calories. Dairy Choose low-fat milk and dairy options. Dairy includes fat and protein, as well as calcium.  Fats and Oils Limit high-fat foods such as  fried foods, sweets, baked goods, sugary drinks.  Other Creamy sauces and condiments, such as mayonnaise, can add extra fat. Think about whether or not you need to use them, or use smaller amounts or low fat options. WHAT FOODS ARE NOT RECOMMENDED?  High fat foods, such as:  [employer].  Ice cream.  [place] toast.  Sweet rolls.  Pizza.  Cheese bread.  Foods covered with batter, butter, creamy sauces, or cheese.  Fried foods.  Sugary drinks and desserts.  Foods that cause gas or bloating Document Released: 09/30/2013 Document Reviewed: 09/30/2013 [facility] Patient Information 2015 [place], [place]. This information is not intended to replace advice given to you by your health care provider. Make sure you discuss any questions you have with your health care provider.

## 2015-06-10 NOTE — Progress Notes (Signed)
Subjective:    Patient ID: Randy Ballard, male    DOB: August 02, 1961, 54 y.o.   MRN: [id number]  Chief Complaint  Patient presents with  . Annual Exam    HPI  Randy Ballard is a delightful 54 yo male who is here today for his complete physical.  I last saw him 4 mos prior with complete labs at that time. TSH stable for over 2 yrs  Lipids were at goal on lipitor 40 though hgba1c not at goal and urine microalb elevated.  cmp and psa were normal. Vit D mildly low.  Health Maintenance  Topic Date Due  . Hepatitis C Screening  03-18-1961  . HIV Screening  01/19/1975  . INFLUENZA VACCINE  05/10/2015  . OPHTHALMOLOGY EXAM  05/10/2015  . HEMOGLOBIN A1C  07/31/2015  . FOOT EXAM  01/29/2016  . URINE MICROALBUMIN  01/29/2016  . PNEUMOCOCCAL POLYSACCHARIDE VACCINE (2) 10/27/2017  . COLONOSCOPY - done Feb 2014 with 5 polyps 11/19/2022  . TETANUS/TDAP - dont 2015 03/06/2024  PSA done 01/2015 but was sig increased at 3.57 up from 0.52 3 years ago so will definitely want to keep a close eye on PSA  Immunization History  Administered Date(s) Administered  . Influenza Split 09/23/2013  . Influenza,inj,Quad PF,36+ Mos 07/03/2014  . Pneumococcal Polysaccharide-23 10/27/2012  . Tdap 03/06/2014   On vit D - taking 1 tab bid  Has been trying to quit smoking by cutting down gradually and at last visit was able to stop smoking for several days.  Has smoked 1 ppd x 32 yrs so qualifies for lung cancer screening CT.  Splitting 1 ppd now so stopped for a while - has had a pack last him 4d - will have 2-3 in a.m. And then does yard exercise in the morning and then again in the evening when not to high - having to use push mower which is good exercise.  H/o nml w/u anemia w/ nml B12, folate, iron.  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  Over the last several visits, hgba1c has been gradually increasing from 6.6 -> 7.0 -> 7.7 4 -> 7.7 mos prior so pt going to work on [place] as he had not been following diet.  occ has a twinkie - switched to decaf coffee last week but less donuts. Has switched to sugar free  last foot exam was approximately 4 mos ago along with urine microalbumin which was +, pt on lisinopril. Going to podiatrist  last dilated eye exam was approximately 4 mos ago - got new rx for trifocals and still getting dizzy -going to switch back to bifocals Hypoglycemia - none in memory Exercising H/o pancreatitis which concerned it was from [place]  Having some more back pain - is shrinking - hearing back pop, sig pan and stiffness after standing for a while  Constipation responding well to senokot S - only using it 1-2x/w,  Past Medical History  Diagnosis Date  . Diabetes mellitus without complication   . Hyperlipidemia   . Hypertension   . Hypogonadism male   . 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 Diverticulitis   . SCA-3 (spinocerebellar ataxia type 3)   . Neuromuscular disorder    Past Surgical  History  Procedure Laterality Date  . Biopsy thyroid  02/06/2009    cold thyroid nodule; negative/benign.  . Colonoscopy w/ polypectomy  Feb 2014    5 polyps   Current Outpatient Prescriptions on File Prior to Visit  Medication Sig Dispense Refill  . aspirin 81 MG tablet Take 1 tablet (81 mg total) by mouth daily. 30 tablet 11  . calcium-vitamin D (OSCAL WITH D) 500-200 MG-UNIT per tablet Take 1 tablet by mouth 2 (two) times daily.    Marland Kitchen glucose blood test strip Test blood sugar daily. 100 each 3  . Lanc[employer] Devices (LANCING DEVICE) MISC Test blood sugar daily. 1 each 0  . Lancets MISC Test blood sugar daily 100 each 3  . levothyroxine (SYNTHROID, LEVOTHROID) 125 MCG tablet take 1 tablet by mouth once daily BEFORE BREAKFAST 30 tablet 5  . metoprolol  succinate (TOPROL-XL) 100 MG 24 hr tablet take 1 tablet by mouth once daily WITH OR IMMEDIATELY FOLLOWING A MEAL 30 tablet 5  . senna-docusate (SENOKOT S) 8.6-50 MG per tablet Take 2 tablets by mouth at bedtime. 60 tablet 1  . Blood Glucose Monitoring Suppl (BLOOD GLUCOSE METER) kit Test blood sugar daily (Patient not taking: Reported on 06/10/2015) 1 each 0   No current facility-administered medications on file prior to visit.   No Known Allergies Family History  Problem Relation Age of Onset  . Adopted: Yes  . Ataxia Mother 47    SCA3  . Ataxia Maternal Aunt     SCA3  . Ataxia Maternal Grandmother     SCA3   Social History   Social History  . Marital Status: Married    Spouse Name: Helene Kelp  . Number of Children: 0  . Years of Education: HS   Occupational History  . service rep   . retired    Social History Main Topics  . Smoking status: Current Some Day Smoker -- 16.00 packs/day for 32 years    Types: Cigarettes  . Smokeless tobacco: Never Used  . Alcohol Use: No  . Drug Use: No  . Sexual Activity: Yes     Comment: 1 partner in last 12 months   Other Topics Concern  . None   Social History Narrative   Marital status:  Married x 20 years; happily.      Children: none      Lives: with wife.      Employment: delivers oxygen; works for [employer]; drives locally      Tobacco: trying to quit 10/2012.      Alcohol: None      Drugs: none      Exercise: treadmill sporadic.     Review of Systems  Constitutional: Negative for fever, chills, activity change, appetite change and unexpected weight change.  HENT: Negative for trouble swallowing.   Respiratory: Negative for cough, chest tightness, shortness of breath and wheezing.   Cardiovascular: Negative for chest pain and palpitations.  Gastrointestinal: Positive for constipation. Negative for nausea, vomiting, abdominal pain and abdominal distention.  Musculoskeletal: Positive for myalgias, back pain, joint swelling,  arthralgias and gait problem.  Skin: Negative for rash.  Neurological: Positive for dizziness, weakness and light-headedness. Negative for numbness and headaches.  All other systems reviewed and are negative.      Objective:  BP 130/72 mmHg  Pulse 87  Temp(Src) 98.3 F (36.8 C) (Oral)  Resp 16  Ht 5' 6.75" (1.695 m)  Wt 221 lb 3.2 oz (100.336 kg)  BMI 34.92 kg/m2  SpO2  96%  Physical Exam  Constitutional: He is oriented to person, place, and time. He appears well-developed and well-nourished. No distress.  HENT:  Head: Normocephalic and atraumatic.  Right Ear: Tympanic membrane, external ear and ear canal normal.  Left Ear: Tympanic membrane, external ear and ear canal normal.  Nose: Nose normal.  Mouth/Throat: Uvula is midline, oropharynx is clear and moist and mucous membranes are normal. No oropharyngeal exudate.  Eyes: Conjunctivae are normal. Right eye exhibits no discharge. Left eye exhibits no discharge. No scleral icterus.  Neck: Normal range of motion. Neck supple. No thyromegaly present.  Cardiovascular: Normal rate, regular rhythm, normal heart sounds and intact distal pulses.   Pulmonary/Chest: Effort normal and breath sounds normal. No respiratory distress.  Abdominal: Soft. Normal appearance and bowel sounds are normal. He exhibits ascites and mass. He exhibits no distension, no abdominal bruit and no pulsatile midline mass. There is splenomegaly. There is no hepatomegaly. There is no tenderness. There is no rebound, no guarding and no CVA tenderness. No hernia.  Genitourinary: Rectum normal. Rectal exam shows no mass, no tenderness and anal tone normal. Prostate is enlarged. Prostate is not tender.  Musculoskeletal: He exhibits no edema.  Lymphadenopathy:    He has no cervical adenopathy.  Neurological: He is alert and oriented to person, place, and time. No cranial nerve deficit. He exhibits abnormal muscle tone.  Skin: Skin is warm and dry. No rash noted. He is not  diaphoretic. No erythema.  Psychiatric: He has a normal mood and affect. His behavior is normal.      Results for orders placed or performed in visit on 06/10/15  POCT glycosylated hemoglobin (Hb A1C)  Result Value Ref Range   Hemoglobin A1C 7.7   POCT urinalysis dipstick  Result Value Ref Range   Color, UA Yellow    Clarity, UA Clear    Glucose, UA 500    Bilirubin, UA Negative    Ketones, UA Trace    Spec Grav, UA 1.025    Blood, UA Trace    pH, UA 5.0    Protein, UA 30    Urobilinogen, UA 0.2    Nitrite, UA Negative    Leukocytes, UA Negative Negative    Assessment & Plan:   1. Annual physical exam - flu shot today  2. Screening for cardiovascular, respiratory, and genitourinary diseases   3. Screening for colorectal cancer   4. Screening for deficiency anemia   5. Screening for prostate cancer   6. Essential hypertension - well controlled on lispinopril-hctz  7. Type 2 diabetes mellitus with complication - maxed out on metformin 1000 bid and glimepiride 8 qam so work on diet/exercise.  Had pancreatitis attributed to Januvia prior.  Reviewed recommended practical diet changes.  8. 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  9. SCA-3 (spinocerebellar ataxia type 3) - completed PT earlier this yr and still doing home exercises, doing well.  10. Dyslipidemia   11. BMI 34.0-34.9,adult   12. Tobacco abuse - call cancer center to arrange screening low dose lung CT scan  13. Routine screening for STI (sexually transmitted infection)   14. Vitamin D deficiency   15. Rising PSA level - recheck today  16. Flu vaccine need   17. Prostatic enlargement   18. Abdominal distension   19. BPH associated with nocturia   20. Weight loss   21. Left upper quadrant abdominal swelling - check CT - new  22. Kidney cysts   23.  Tinea pedis of both feet   24. Type 2 diabetes mellitus with diabetic polyneuropathy   25. Essential hypertension,  benign     Orders Placed This Encounter  Procedures  . CT Abdomen Pelvis W Contrast    PER REBECCA DO WITH CONTRAST ONLY     Standing Status: Future     Number of Occurrences:      Standing Expiration Date: 09/09/2016    Order Specific Question:  Reason for Exam (SYMPTOM  OR DIAGNOSIS REQUIRED)    Answer:  LUQ mass palpable on exam, involuntary weight loss, newly enlarged mass, recheck on renal cysts, pancreatitis, adrenal enlargment, night sweats    Order Specific Question:  Preferred imaging location?    Answer:  [facility]  . Flu Vaccine QUAD 36+ mos IM  . HIV antibody  . Hepatitis C antibody  . Comprehensive metabolic panel  . TSH  . CBC  . PSA  . Lipid panel    Order Specific Question:  Has the patient fasted?    Answer:  Yes  . POCT glycosylated hemoglobin (Hb A1C)  . POCT urinalysis dipstick    Meds ordered this encounter  Medications  . nystatin (MYCOSTATIN/NYSTOP) 100000 UNIT/GM POWD    Sig: Apply 5 g topically 2 (two) times daily. Between and under toes, esp 5th toenail    Dispense:  60 g    Refill:  5  . metFORMIN (GLUCOPHAGE) 1000 MG tablet    Sig: Take 1 tablet (1,000 mg total) by mouth 2 (two) times daily with a meal.    Dispense:  60 tablet    Refill:  5  . lisinopril-hydrochlorothiazide (PRINZIDE,ZESTORETIC) 20-25 MG per tablet    Sig: Take 1 tablet by mouth daily.    Dispense:  30 tablet    Refill:  5  . glimepiride (AMARYL) 4 MG tablet    Sig: Take 2 tablets (8 mg total) by mouth daily with breakfast.    Dispense:  60 tablet    Refill:  5    [name], MD MPH  Results for orders placed or performed in visit on 06/10/15  HIV antibody  Result Value Ref Range   HIV 1&2 Ab, 4th Generation NONREACTIVE NONREACTIVE  Hepatitis C antibody  Result Value Ref Range   HCV Ab NEGATIVE NEGATIVE  Comprehensive metabolic panel  Result Value Ref Range   Sodium 139 135 - 146 mmol/L   Potassium 4.5 3.5 - 5.3 mmol/L   Chloride 100 98 - 110 mmol/L    CO2 28 20 - 31 mmol/L   Glucose, Bld 210 (H) 65 - 99 mg/dL   BUN 16 7 - 25 mg/dL   Creat 0.90 0.70 - 1.33 mg/dL   Total Bilirubin 0.6 0.2 - 1.2 mg/dL   Alkaline Phosphatase 77 40 - 115 U/L   AST 23 10 - 35 U/L   ALT 31 9 - 46 U/L   Total Protein 7.4 6.1 - 8.1 g/dL   Albumin 4.5 3.6 - 5.1 g/dL   Calcium 9.8 8.6 - 10.3 mg/dL  TSH  Result Value Ref Range   TSH 2.343 0.350 - 4.500 uIU/mL  CBC  Result Value Ref Range   WBC 9.6 4.0 - 10.5 K/uL   RBC 4.19 (L) 4.22 - 5.81 MIL/uL   Hemoglobin 12.7 (L) 13.0 - 17.0 g/dL   HCT 37.5 (L) 39.0 - 52.0 %   MCV 89.5 78.0 - 100.0 fL   MCH 30.3 26.0 - 34.0 pg   MCHC 33.9  30.0 - 36.0 g/dL   RDW 12.5 11.5 - 15.5 %   Platelets 301 150 - 400 K/uL   MPV 11.0 8.6 - 12.4 fL  PSA  Result Value Ref Range   PSA 0.71 <=4.00 ng/mL  Lipid panel  Result Value Ref Range   Cholesterol 122 (L) 125 - 200 mg/dL   Triglycerides 129 <150 mg/dL   HDL 30 (L) >=40 mg/dL   Total CHOL/HDL Ratio 4.1 <=5.0 Ratio   VLDL 26 <30 mg/dL   LDL Cholesterol 66 <130 mg/dL  POCT glycosylated hemoglobin (Hb A1C)  Result Value Ref Range   Hemoglobin A1C 7.7   POCT urinalysis dipstick  Result Value Ref Range   Color, UA Yellow    Clarity, UA Clear    Glucose, UA 500    Bilirubin, UA Negative    Ketones, UA Trace    Spec Grav, UA 1.025    Blood, UA Trace    pH, UA 5.0    Protein, UA 30    Urobilinogen, UA 0.2    Nitrite, UA Negative    Leukocytes, UA Negative Negative

## 2015-06-11 ENCOUNTER — Encounter: Payer: Federal, State, Local not specified - PPO | Admitting: Family Medicine

## 2015-06-11 LAB — PSA: PSA: 0.71 ng/mL (ref <=4.00)

## 2015-06-14 MED ORDER — ATORVASTATIN CALCIUM 40 MG PO TABS: 40.0000 mg | ORAL_TABLET | Freq: Every day | ORAL | Status: DC

## 2015-08-03 ENCOUNTER — Ambulatory Visit: Payer: Federal, State, Local not specified - PPO | Admitting: Podiatry

## 2015-08-07 ENCOUNTER — Telehealth: Payer: Self-pay | Admitting: Family Medicine

## 2015-08-07 NOTE — Telephone Encounter (Signed)
Left message on both machines to call and reschedule his appt that he had with Dr Clelia CroftShaw on 10/14/15

## 2015-10-10 HISTORY — PX: TOOTH EXTRACTION: SUR596

## 2015-10-14 ENCOUNTER — Ambulatory Visit: Payer: Federal, State, Local not specified - PPO | Admitting: Family Medicine

## 2015-10-27 ENCOUNTER — Encounter: Payer: Self-pay | Admitting: Diagnostic Neuroimaging

## 2015-10-27 ENCOUNTER — Ambulatory Visit (INDEPENDENT_AMBULATORY_CARE_PROVIDER_SITE_OTHER): Payer: Federal, State, Local not specified - PPO | Admitting: Diagnostic Neuroimaging

## 2015-10-27 VITALS — BP 145/91 | HR 67 | Ht 66.75 in | Wt 222.0 lb

## 2015-10-27 DIAGNOSIS — G118 Other hereditary ataxias: Secondary | ICD-10-CM

## 2015-10-27 NOTE — Patient Instructions (Signed)
Thank you for coming to see [place] at [facility]. I hope we have been able to provide you high quality care today.  You may receive a patient satisfaction survey over the next few weeks. We would appreciate your feedback and comments so that we may continue to improve ourselves and the health of our patients.  - stay active and keep moving, but caution with balance and walking; use cane and walker when needed   ~~~~~~~~~~~~~~~~~~~~~~~~~~~~~~~~~~~~~~~~~~~~~~~~~~~~~~~~~~~~~~~~~  DR. Mahlia Fernando'S GUIDE TO HAPPY AND HEALTHY LIVING These are some of my general health and wellness recommendations. Some of them may apply to you better than others. Please use common sense as you try these suggestions and feel free to ask me any questions.   ACTIVITY/FITNESS Mental, social, emotional and physical stimulation are very important for brain and body health. Try learning a new activity (arts, music, language, sports, games).  Keep moving your body to the best of your abilities. You can do this at home, inside or outside, the park, community center, gym or anywhere you like. Consider a physical therapist or personal trainer to get started. Consider the app Sworkit. Fitness trackers such as smart-watches, smart-phones or Fitbits can help as well.   NUTRITION Eat more plants: colorful vegetables, nuts, seeds and berries.  Eat less sugar, salt, preservatives and processed foods.  Avoid toxins such as cigarettes and alcohol.  Drink water when you are thirsty. Warm water with a slice of lemon is an excellent morning drink to start the day.  Consider these websites for more information The Nutrition Source (https://www.henry-hernandez.biz/) Precision Nutrition (WindowBlog.ch)   RELAXATION Consider practicing mindfulness meditation or other relaxation techniques such as deep breathing, prayer, yoga, tai chi, massage. See website mindful.org or the  apps Headspace or Calm to help get started.   SLEEP Try to get at least 7-8+ hours sleep per day. Regular exercise and reduced caffeine will help you sleep better. Practice good sleep hygeine techniques. See website sleep.org for more information.   PLANNING Prepare estate planning, living will, healthcare POA documents. Sometimes this is best planned with the help of an attorney. Theconversationproject.org and agingwithdignity.org are excellent resources.

## 2015-10-27 NOTE — Progress Notes (Signed)
[facility]  PATIENT: Randy Ballard DOB: 1961-02-06  REFERRING CLINICIAN:  HISTORY FROM: patient  REASON FOR VISIT: follow up   HISTORICAL  CHIEF COMPLAINT:  Chief Complaint  Patient presents with  . SCA (spinocerebellar ataxia type 3)    rm 6  . Follow-up    1 year    HISTORY OF PRESENT ILLNESS:   UPDATE 10/27/15: Since last visit, stable. Only 1 minor fall in last year. BP better. Trying to stay active. Having some dental work done, which affects food particle size, and then some swallow issues occur.   UPDATE 10/26/14 ([facility]P): Since last visit, has gone through PT evaluation with some benefit. Now doing exercises at home. No falls. No major pain or spasms. No slurred speech or trouble swallowing.   UPDATE 04/20/14: Since last visit, sxs are stable. Now approved for SSI/SSD, due to start in Sept 2015. Not able to afford PT yet, but hopefully in Oct 2015. Int muscle twitching, without pain.   PRIOR HPI (12/17/13): 55 year old male here for valuation of SCA type 3. 2012 patient developed some muscle weakness, difficulty climbing stairs, balance difficulty. By 2014 his balance is significantly affected. He went through physical therapy without relief. On 11/14/2013 patient quit his job because of these balance and walking difficulties. Patient's mother developed balance and walking problems 15 years ago (when she was 56 years old), resulting in multiple falls. Eventually she was transitioned to a nursing home. She was ultimately diagnosed with spinocerebellar ataxia type III. Patient's maternal grandmother and maternal aunt also have similar condition. Patient himself does not have any children. Patient has no siblings. Patient underwent gene testing which was positive for spinocerebellar ataxia type III. Nowadays patient having some muscle twitching in his quadriceps, shoulders, arms as well as some slurred speech. He's having difficulty with uneven and slip  resurfaces.  REVIEW OF SYSTEMS: Full 14 system review of systems performed and notable only for anemia numbness weakness back pain walking diff.    ALLERGIES: No Known Allergies  HOME MEDICATIONS: Outpatient Prescriptions Prior to Visit  Medication Sig Dispense Refill  . aspirin 81 MG tablet Take 1 tablet (81 mg total) by mouth daily. 30 tablet 11  . atorvastatin (LIPITOR) 40 MG tablet Take 1 tablet (40 mg total) by mouth daily. 90 tablet 3  . Blood Glucose Monitoring Suppl (BLOOD GLUCOSE METER) kit Test blood sugar daily 1 each 0  . calcium-vitamin D (OSCAL WITH D) 500-200 MG-UNIT per tablet Take 1 tablet by mouth 2 (two) times daily.    Marland Kitchen glimepiride (AMARYL) 4 MG tablet Take 2 tablets (8 mg total) by mouth daily with breakfast. 60 tablet 5  . glucose blood test strip Test blood sugar daily. 100 each 3  . Lancet Devices (LANCING DEVICE) MISC Test blood sugar daily. 1 each 0  . levothyroxine (SYNTHROID, LEVOTHROID) 125 MCG tablet take 1 tablet by mouth once daily BEFORE BREAKFAST 30 tablet 5  . lisinopril-hydrochlorothiazide (PRINZIDE,ZESTORETIC) 20-25 MG per tablet Take 1 tablet by mouth daily. 30 tablet 5  . metFORMIN (GLUCOPHAGE) 1000 MG tablet Take 1 tablet (1,000 mg total) by mouth 2 (two) times daily with a meal. 60 tablet 5  . metoprolol succinate (TOPROL-XL) 100 MG 24 hr tablet take 1 tablet by mouth once daily WITH OR IMMEDIATELY FOLLOWING A MEAL 30 tablet 5  . Lancets MISC Test blood sugar daily 100 each 3  . nystatin (MYCOSTATIN/NYSTOP) 100000 UNIT/GM POWD Apply 5 g topically 2 (two) times daily. Between  and under toes, esp 5th toenail 60 g 5  . senna-docusate (SENOKOT S) 8.6-50 MG per tablet Take 2 tablets by mouth at bedtime. 60 tablet 1   No facility-administered medications prior to visit.    PAST MEDICAL HISTORY: Past Medical History  Diagnosis Date  . Diabetes mellitus without complication ([place])   . Hyperlipidemia   . Hypertension   . Hypogonadism male   .  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 Diverticulitis   . SCA-3 (spinocerebellar ataxia type 3) ([place])   . Neuromuscular disorder ([place])     PAST SURGICAL HISTORY: Past Surgical History  Procedure Laterality Date  . Biopsy thyroid  02/06/2009    cold thyroid nodule; negative/benign.  . Colonoscopy w/ polypectomy  Feb 2014    5 polyps  . Tooth extraction  10/2015    x 4 teeth    FAMILY HISTORY: Family History  Problem Relation Age of Onset  . Adopted: Yes  . Ataxia Mother 64    SCA3  . Ataxia Maternal Aunt     SCA3  . Ataxia Maternal Grandmother     SCA3    SOCIAL HISTORY:  Social History   Social History  . Marital Status: Married    Spouse Name: Helene Kelp  . Number of Children: 0  . Years of Education: HS   Occupational History  . service rep   . retired    Social History Main Topics  . Smoking status: Current Some Day Smoker -- 0.50 packs/day for 32 years    Types: Cigarettes  . Smokeless tobacco: Never Used     Comment: 10/26/15 trying to quit  . Alcohol Use: No  . Drug Use: No  . Sexual Activity: Yes     Comment: 1 partner in last 12 months   Other Topics Concern  . Not on file   Social History Narrative   Marital status:  Married x 20 years; happily.      Children: none      Lives: with wife.      Employment: delivers oxygen; works for [employer]; drives locally      Tobacco: trying to quit 10/2012.      Alcohol: None      Drugs: none      Exercise: treadmill sporadic.    PHYSICAL EXAM  Filed Vitals:   10/27/15 1007  BP: 145/91  Pulse: 67  Height: 5' 6.75" (1.695 m)  Weight: 222 lb (100.699 kg)    Not recorded      Body mass index is 35.05 kg/(m^2).  GENERAL EXAM: Patient is in no distress; well developed, nourished and groomed; neck is supple  CARDIOVASCULAR: Regular rhythm; NO MURMURS  NEUROLOGIC: MENTAL STATUS: awake, alert, language fluent, comprehension intact, naming intact, fund of  knowledge appropriate CRANIAL NERVE: pupils equal and reactive to light, visual fields full to confrontation, extraocular muscles intact, MILD SACCADIC DYSMETRIA, facial sensation and strength symmetric, hearing intact, palate elevates symmetrically, uvula midline, shoulder shrug symmetric, tongue midline; MILD DYSARTHRIA. MOTOR: normal bulk; MILD INCR TONE IN RUE AND BLE; full strength in the BUE; BLE 4+/5 WITH INCR TONE. BRADYKINESIA IN BUE AND BLE (WORSE IN LLE)  SENSORY: ABSENT VIB AT TOES. NORMAL PP, LT, TEMP. COORDINATION: MILD DYSMETRIA WITH LUE > RUE REFLEXES: BUE TRACE, KNEES TRACE, ANKLES 0 GAIT/STATION: WIDE BASED GAIT, SLOW ATAXIC GAIT.     DIAGNOSTIC DATA (LABS, IMAGING, TESTING) - I reviewed patient records, labs, notes, testing  and imaging myself where available.  Lab Results  Component Value Date   WBC 9.6 06/10/2015   HGB 12.7* 06/10/2015   HCT 37.5* 06/10/2015   MCV 89.5 06/10/2015   PLT 301 06/10/2015      Component Value Date/Time   NA 139 06/10/2015 0948   K 4.5 06/10/2015 0948   CL 100 06/10/2015 0948   CO2 28 06/10/2015 0948   GLUCOSE 210* 06/10/2015 0948   BUN 16 06/10/2015 0948   CREATININE 0.90 06/10/2015 0948   CREATININE 0.80 04/17/2013 0515   CALCIUM 9.8 06/10/2015 0948   PROT 7.4 06/10/2015 0948   ALBUMIN 4.5 06/10/2015 0948   AST 23 06/10/2015 0948   ALT 31 06/10/2015 0948   ALKPHOS 77 06/10/2015 0948   BILITOT 0.6 06/10/2015 0948   GFRNONAA >89 10/31/2013 1621   GFRNONAA >90 04/17/2013 0515   GFRAA >89 10/31/2013 1621   GFRAA >90 04/17/2013 0515   Lab Results  Component Value Date   CHOL 122* 06/10/2015   HDL 30* 06/10/2015   LDLCALC 66 06/10/2015   TRIG 129 06/10/2015   CHOLHDL 4.1 06/10/2015   Lab Results  Component Value Date   HGBA1C 7.7 06/10/2015   Lab Results  Component Value Date   VITAMINB12 817 11/15/2012   Lab Results  Component Value Date   TSH 2.343 06/10/2015    I reviewed images myself and agree with  interpretation. -[facility]P  06/25/07 MRI brain (with and without)  1. 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  2. No anterior lobe pituitary lesions to suggest a micro- or macroadenoma resulting in thyroid hormonal dysfunction.  3. Tiny focus of increased white matter signal in the right pons, likely due to small vessel disease from diabetes/hypertension.   ASSESSMENT AND PLAN  55 y.o. year old male here with spinocerebellar ataxia type 3, based on symptoms, family history and gene testing. Unfortunately there is no specific treatment for this condition. Levodopa is sometimes used to help with tremor, stiffness and bradykinesia. Baclofen is sometimes used for also spasms. At this time patient's main issue is balance and walking. He has done well with first round PT exercises, and is continuing them at home.    Dx: SCA 3   PLAN: - continue exercises and fall precautions  Return in about 1 year (around 10/26/2016).    Penni Bombard, MD 4/62/8638, 17:71 AM Certified in Neurology, Neurophysiology and Neuroimaging  [facility] [address] [place], [place] [address] [phone]

## 2015-11-17 ENCOUNTER — Other Ambulatory Visit: Payer: Self-pay | Admitting: Family Medicine

## 2015-11-19 ENCOUNTER — Telehealth: Payer: Self-pay

## 2015-11-19 NOTE — Telephone Encounter (Signed)
Patient of Dr. Clelia Croft. Requests refill on metoprolol and levothyroxine. Epic says he has 5 refills left but his pharmacy says they don't show any refills. Patient only has 2 days worth of medication left. Preferred pharmacy is [facility] on [address] Cb# [phone].

## 2015-11-22 NOTE — Telephone Encounter (Signed)
This was sent in on 2/11. Notified pt on VM of RFs and need for f/up after that.

## 2015-11-25 ENCOUNTER — Ambulatory Visit (INDEPENDENT_AMBULATORY_CARE_PROVIDER_SITE_OTHER): Payer: Federal, State, Local not specified - PPO | Admitting: Family Medicine

## 2015-11-25 ENCOUNTER — Encounter: Payer: Self-pay | Admitting: Family Medicine

## 2015-11-25 VITALS — BP 130/80 | HR 94 | Temp 98.5°F | Resp 16 | Ht 67.5 in | Wt 229.0 lb

## 2015-11-25 DIAGNOSIS — G118 Other hereditary ataxias: Secondary | ICD-10-CM

## 2015-11-25 DIAGNOSIS — E785 Hyperlipidemia, unspecified: Secondary | ICD-10-CM

## 2015-11-25 DIAGNOSIS — E118 Type 2 diabetes mellitus with unspecified complications: Secondary | ICD-10-CM

## 2015-11-25 DIAGNOSIS — E032 Hypothyroidism due to medicaments and other exogenous substances: Secondary | ICD-10-CM | POA: Diagnosis not present

## 2015-11-25 DIAGNOSIS — Z6834 Body mass index (BMI) 34.0-34.9, adult: Secondary | ICD-10-CM | POA: Diagnosis not present

## 2015-11-25 DIAGNOSIS — Z720 Tobacco use: Secondary | ICD-10-CM | POA: Diagnosis not present

## 2015-11-25 DIAGNOSIS — I10 Essential (primary) hypertension: Secondary | ICD-10-CM | POA: Diagnosis not present

## 2015-11-25 DIAGNOSIS — E1142 Type 2 diabetes mellitus with diabetic polyneuropathy: Secondary | ICD-10-CM

## 2015-11-25 LAB — COMPREHENSIVE METABOLIC PANEL
ALBUMIN: 4.5 g/dL (ref 3.6–5.1)
ALT: 23 U/L (ref 9–46)
AST: 23 U/L (ref 10–35)
Alkaline Phosphatase: 78 U/L (ref 40–115)
BILIRUBIN TOTAL: 0.6 mg/dL (ref 0.2–1.2)
BUN: 14 mg/dL (ref 7–25)
CHLORIDE: 101 mmol/L (ref 98–110)
CO2: 26 mmol/L (ref 20–31)
Calcium: 9.6 mg/dL (ref 8.6–10.3)
Creat: 0.9 mg/dL (ref 0.70–1.33)
Glucose, Bld: 105 mg/dL — ABNORMAL HIGH (ref 65–99)
Potassium: 4.2 mmol/L (ref 3.5–5.3)
SODIUM: 139 mmol/L (ref 135–146)
Total Protein: 7.2 g/dL (ref 6.1–8.1)

## 2015-11-25 LAB — POCT GLYCOSYLATED HEMOGLOBIN (HGB A1C): HEMOGLOBIN A1C: 7.6

## 2015-11-25 LAB — TSH: TSH: 2.41 mIU/L (ref 0.40–4.50)

## 2015-11-25 MED ORDER — METOPROLOL SUCCINATE ER 100 MG PO TB24: 100.0000 mg | ORAL_TABLET | Freq: Every day | ORAL | Status: DC

## 2015-11-25 MED ORDER — METFORMIN HCL 1000 MG PO TABS: 1000.0000 mg | ORAL_TABLET | Freq: Two times a day (BID) | ORAL | Status: DC

## 2015-11-25 MED ORDER — ATORVASTATIN CALCIUM 40 MG PO TABS: 40.0000 mg | ORAL_TABLET | Freq: Every day | ORAL | Status: DC

## 2015-11-25 MED ORDER — GLIMEPIRIDE 4 MG PO TABS: 8.0000 mg | ORAL_TABLET | Freq: Every day | ORAL | Status: DC

## 2015-11-25 MED ORDER — LISINOPRIL-HYDROCHLOROTHIAZIDE 20-25 MG PO TABS: 1.0000 | ORAL_TABLET | Freq: Every day | ORAL | Status: DC

## 2015-11-25 NOTE — Progress Notes (Signed)
Subjective:    Patient ID: Randy Ballard, male    DOB: Feb 20, 1961, 55 y.o.   MRN: [id number] Chief Complaint  Patient presents with  . Follow-up  . Diabetes  . Medication Refill    all meds    HPI   Starting nicotine patches, smoking intermittently, start smoothies as meal replacement to hep with weight loss. Walking through stores and has a treadmill at home which he is going to restart and is going to work up to [place] which they have.  Has had 5 teeth pulled but has to leave in one that is connected to his sinus, then has a root canal next week.   Still has meter but not checking cbgs. BP is awesome 130s/70s  2010-2011 wore holter  cpe 06/2016   Depression screen [facility] 2/9 11/25/2015 06/10/2015 11/06/2014 03/06/2014  Decreased Interest 0 0 0 0  Down, Depressed, Hopeless 0 0 0 0  PHQ - 2 Score 0 0 0 0    Past Medical History  Diagnosis Date  . Diabetes mellitus without complication ([place])   . Hyperlipidemia   . Hypertension   . Hypogonadism male   . 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 Diverticulitis   . SCA-3 (spinocerebellar ataxia type 3) ([place])   . Neuromuscular disorder [facility])    Past Surgical History  Procedure Laterality Date  . Biopsy thyroid  02/06/2009    cold thyroid nodule; negative/benign.  . Colonoscopy w/ polypectomy  Feb 2014    5 polyps  . Tooth extraction  10/2015    x 4 teeth   Current Outpatient Prescriptions on File Prior to Visit  Medication Sig Dispense Refill  . aspirin 81 MG tablet Take 1 tablet (81 mg total) by mouth daily. 30 tablet 11  . calcium-vitamin D (OSCAL WITH D) 500-200 MG-UNIT per tablet Take 1 tablet by mouth 2 (two) times daily.    Marland Kitchen glucose blood test strip Test blood sugar daily. 100 each 3  . Lancet Devices (LANCING DEVICE) MISC Test blood sugar daily. 1 each 0  . Blood Glucose Monitoring Suppl (BLOOD GLUCOSE METER) kit Test blood sugar daily (Patient not taking: Reported on  11/25/2015) 1 each 0   No current facility-administered medications on file prior to visit.   No Known Allergies Family History  Problem Relation Age of Onset  . Adopted: Yes  . Ataxia Mother 82    SCA3  . Ataxia Maternal Aunt     SCA3  . Ataxia Maternal Grandmother     SCA3   Social History   Social History  . Marital Status: Married    Spouse Name: Helene Kelp  . Number of Children: 0  . Years of Education: HS   Occupational History  . service rep   . retired    Social History Main Topics  . Smoking status: Current Some Day Smoker -- 0.50 packs/day for 32 years    Types: Cigarettes  . Smokeless tobacco: Never Used     Comment: 10/26/15 trying to quit  . Alcohol Use: No  . Drug Use: No  . Sexual Activity: Yes     Comment: 1 partner in last 12 months   Other Topics Concern  . None   Social History Narrative   Marital status:  Married x 20 years; happily.      Children: none      Lives: with wife.      Employment: delivers oxygen;  works for [employer]; drives locally      Tobacco: trying to quit 10/2012.      Alcohol: None      Drugs: none      Exercise: treadmill sporadic.     Review of Systems  Constitutional: Negative for fever, chills, activity change, appetite change and unexpected weight change.  HENT: Positive for dental problem and sinus pressure.   Cardiovascular: Negative for leg swelling.  Gastrointestinal: Negative for vomiting.  Musculoskeletal: Positive for myalgias, back pain, arthralgias and gait problem. Negative for joint swelling.  Skin: Negative for rash and wound.  Neurological: Positive for weakness and numbness.  Psychiatric/Behavioral: Negative for dysphoric mood. The patient is not nervous/anxious.        Objective:  BP 130/80 mmHg  Pulse 94  Temp(Src) 98.5 F (36.9 C) (Oral)  Resp 16  Ht 5' 7.5" (1.715 m)  Wt 229 lb (103.874 kg)  BMI 35.32 kg/m2  SpO2 97%  Physical Exam  Constitutional: He is oriented to person, place, and time.  He appears well-developed and well-nourished. No distress.  HENT:  Head: Normocephalic and atraumatic.  Eyes: Conjunctivae are normal. Pupils are equal, round, and reactive to light. No scleral icterus.  Neck: Normal range of motion. Neck supple. No thyromegaly present.  Cardiovascular: Normal rate, regular rhythm, normal heart sounds and intact distal pulses.   Pulmonary/Chest: Effort normal and breath sounds normal. No respiratory distress.  Musculoskeletal: He exhibits no edema.  Lymphadenopathy:    He has no cervical adenopathy.  Neurological: He is alert and oriented to person, place, and time.  Uses cane  Skin: Skin is warm and dry. He is not diaphoretic.  Psychiatric: He has a normal mood and affect. His behavior is normal.            Results for orders placed or performed in visit on 11/25/15  POCT glycosylated hemoglobin (Hb A1C)  Result Value Ref Range   Hemoglobin A1C 7.6    Assessment & Plan:  Refill of levothyroxine. 1. Type 2 diabetes mellitus with complication, without long-term current use of insulin ([place])   2. 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 Essential hypertension   4. SCA-3 (spinocerebellar ataxia type 3) ([place])   5. Dyslipidemia   6. BMI 34.0-34.9,adult   7. Tobacco abuse   8. Type 2 diabetes mellitus with diabetic polyneuropathy, without long-term current use of insulin ([place])   9. Essential hypertension, benign   10. Hyperlipidemia LDL goal <100     Orders Placed This Encounter  Procedures  . Comprehensive metabolic panel  . TSH  . POCT glycosylated hemoglobin (Hb A1C)  . HM Diabetes Foot Exam    Meds ordered this encounter  Medications  . metoprolol succinate (TOPROL-XL) 100 MG 24 hr tablet    Sig: Take 1 tablet (100 mg total) by mouth daily. Take with or immediately following a meal.    Dispense:  30 tablet    Refill:  5  . metFORMIN (GLUCOPHAGE) 1000 MG tablet    Sig: Take 1 tablet (1,000 mg total) by mouth 2  (two) times daily with a meal.    Dispense:  60 tablet    Refill:  5  . lisinopril-hydrochlorothiazide (PRINZIDE,ZESTORETIC) 20-25 MG tablet    Sig: Take 1 tablet by mouth daily.    Dispense:  30 tablet    Refill:  5  . glimepiride (AMARYL) 4 MG tablet    Sig: Take 2 tablets (8 mg total) by mouth daily with  breakfast.    Dispense:  60 tablet    Refill:  5  . atorvastatin (LIPITOR) 40 MG tablet    Sig: Take 1 tablet (40 mg total) by mouth daily.    Dispense:  30 tablet    Refill:  5  . levothyroxine (SYNTHROID, LEVOTHROID) 125 MCG tablet    Sig: Take 1 tablet (125 mcg total) by mouth daily before breakfast.    Dispense:  90 tablet    Refill:  3    [name], MD MPH

## 2015-11-25 NOTE — Patient Instructions (Signed)

## 2015-12-02 MED ORDER — LEVOTHYROXINE SODIUM 125 MCG PO TABS: 125.0000 ug | ORAL_TABLET | Freq: Every day | ORAL | Status: DC

## 2016-03-21 DIAGNOSIS — K080 Exfoliation of teeth due to systemic causes: Secondary | ICD-10-CM | POA: Diagnosis not present

## 2016-03-28 DIAGNOSIS — K080 Exfoliation of teeth due to systemic causes: Secondary | ICD-10-CM | POA: Diagnosis not present

## 2016-05-25 ENCOUNTER — Encounter: Payer: Self-pay | Admitting: Family Medicine

## 2016-05-25 ENCOUNTER — Ambulatory Visit (INDEPENDENT_AMBULATORY_CARE_PROVIDER_SITE_OTHER): Payer: Federal, State, Local not specified - PPO | Admitting: Family Medicine

## 2016-05-25 VITALS — BP 122/78 | HR 88 | Temp 98.2°F | Resp 18 | Ht 67.5 in | Wt 212.2 lb

## 2016-05-25 DIAGNOSIS — G118 Other hereditary ataxias: Secondary | ICD-10-CM

## 2016-05-25 DIAGNOSIS — R1013 Epigastric pain: Secondary | ICD-10-CM | POA: Diagnosis not present

## 2016-05-25 DIAGNOSIS — E785 Hyperlipidemia, unspecified: Secondary | ICD-10-CM

## 2016-05-25 DIAGNOSIS — E032 Hypothyroidism due to medicaments and other exogenous substances: Secondary | ICD-10-CM | POA: Diagnosis not present

## 2016-05-25 DIAGNOSIS — E559 Vitamin D deficiency, unspecified: Secondary | ICD-10-CM | POA: Diagnosis not present

## 2016-05-25 DIAGNOSIS — Z6834 Body mass index (BMI) 34.0-34.9, adult: Secondary | ICD-10-CM | POA: Diagnosis not present

## 2016-05-25 DIAGNOSIS — E1142 Type 2 diabetes mellitus with diabetic polyneuropathy: Secondary | ICD-10-CM

## 2016-05-25 DIAGNOSIS — E118 Type 2 diabetes mellitus with unspecified complications: Secondary | ICD-10-CM

## 2016-05-25 DIAGNOSIS — I10 Essential (primary) hypertension: Secondary | ICD-10-CM

## 2016-05-25 DIAGNOSIS — Z720 Tobacco use: Secondary | ICD-10-CM | POA: Diagnosis not present

## 2016-05-25 LAB — COMPREHENSIVE METABOLIC PANEL
ALK PHOS: 107 U/L (ref 40–115)
ALT: 21 U/L (ref 9–46)
AST: 18 U/L (ref 10–35)
Albumin: 4.6 g/dL (ref 3.6–5.1)
BUN: 16 mg/dL (ref 7–25)
CALCIUM: 9.9 mg/dL (ref 8.6–10.3)
CHLORIDE: 99 mmol/L (ref 98–110)
CO2: 24 mmol/L (ref 20–31)
Creat: 0.99 mg/dL (ref 0.70–1.33)
Glucose, Bld: 161 mg/dL — ABNORMAL HIGH (ref 65–99)
POTASSIUM: 4.4 mmol/L (ref 3.5–5.3)
Sodium: 138 mmol/L (ref 135–146)
TOTAL PROTEIN: 7.6 g/dL (ref 6.1–8.1)
Total Bilirubin: 0.6 mg/dL (ref 0.2–1.2)

## 2016-05-25 LAB — CBC
HCT: 40.6 % (ref 38.5–50.0)
Hemoglobin: 13.7 g/dL (ref 13.2–17.1)
MCH: 31.1 pg (ref 27.0–33.0)
MCHC: 33.7 g/dL (ref 32.0–36.0)
MCV: 92.3 fL (ref 80.0–100.0)
MPV: 10.8 fL (ref 7.5–12.5)
PLATELETS: 305 10*3/uL (ref 140–400)
RBC: 4.4 MIL/uL (ref 4.20–5.80)
RDW: 12 % (ref 11.0–15.0)
WBC: 10.1 10*3/uL (ref 3.8–10.8)

## 2016-05-25 LAB — LIPID PANEL
CHOL/HDL RATIO: 3.9 ratio (ref <=5.0)
CHOLESTEROL: 114 mg/dL — AB (ref 125–200)
HDL: 29 mg/dL — AB (ref >=40)
LDL Cholesterol: 59 mg/dL (ref <130)
TRIGLYCERIDES: 131 mg/dL (ref <150)
VLDL: 26 mg/dL (ref <30)

## 2016-05-25 LAB — MICROALBUMIN / CREATININE URINE RATIO
CREATININE, URINE: 278 mg/dL (ref 20–370)
MICROALB UR: 8.9 mg/dL
MICROALB/CREAT RATIO: 32 ug/mg{creat} — AB (ref <30)

## 2016-05-25 LAB — POCT GLYCOSYLATED HEMOGLOBIN (HGB A1C): HEMOGLOBIN A1C: 7.5

## 2016-05-25 LAB — LIPASE: LIPASE: 39 U/L (ref 7–60)

## 2016-05-25 LAB — TSH: TSH: 2.9 mIU/L (ref 0.40–4.50)

## 2016-05-25 MED ORDER — GLIMEPIRIDE 4 MG PO TABS: 4.0000 mg | ORAL_TABLET | Freq: Every day | ORAL | 5 refills | Status: DC

## 2016-05-25 MED ORDER — METFORMIN HCL 1000 MG PO TABS: 1000.0000 mg | ORAL_TABLET | Freq: Two times a day (BID) | ORAL | 5 refills | Status: DC

## 2016-05-25 MED ORDER — LISINOPRIL-HYDROCHLOROTHIAZIDE 20-25 MG PO TABS: 1.0000 | ORAL_TABLET | Freq: Every day | ORAL | 5 refills | Status: DC

## 2016-05-25 MED ORDER — METOPROLOL SUCCINATE ER 100 MG PO TB24: 100.0000 mg | ORAL_TABLET | Freq: Every day | ORAL | 5 refills | Status: DC

## 2016-05-25 MED ORDER — ATORVASTATIN CALCIUM 40 MG PO TABS: 40.0000 mg | ORAL_TABLET | Freq: Every day | ORAL | 5 refills | Status: DC

## 2016-05-25 MED ORDER — RANITIDINE HCL 150 MG PO TABS: 150.0000 mg | ORAL_TABLET | Freq: Two times a day (BID) | ORAL | 5 refills | Status: DC | PRN

## 2016-05-25 NOTE — Patient Instructions (Signed)
GO GET YOUR EYE EXAM AND HAVE THEM SEND ME A COPY  Low-Fat Diet for Pancreatitis or Gallbladder Conditions A low-fat diet can be helpful if you have pancreatitis or a gallbladder condition. With these conditions, your pancreas and gallbladder have trouble digesting fats. A healthy eating plan with less fat will help rest your pancreas and gallbladder and reduce your symptoms. WHAT DO I NEED TO KNOW ABOUT THIS DIET?  Eat a low-fat diet.  Reduce your fat intake to less than 20-30% of your total daily calories. This is less than 50-60 g of fat per day.  Remember that you need some fat in your diet. Ask your dietician what your daily goal should be.  Choose nonfat and low-fat healthy foods. Look for the words "nonfat," "low fat," or "fat free."  As a guide, look on the label and choose foods with less than 3 g of fat per serving. Eat only one serving.  Avoid alcohol.  Do not smoke. If you need help quitting, talk with your health care provider.  Eat small frequent meals instead of three large heavy meals. WHAT FOODS CAN I EAT? Grains Include healthy grains and starches such as potatoes, wheat bread, fiber-rich cereal, and brown rice. Choose whole grain options whenever possible. In adults, whole grains should account for 45-65% of your daily calories.  Fruits and Vegetables Eat plenty of fruits and vegetables. Fresh fruits and vegetables add fiber to your diet. Meats and Other Protein Sources Eat lean meat such as chicken and pork. Trim any fat off of meat before cooking it. Eggs, fish, and beans are other sources of protein. In adults, these foods should account for 10-35% of your daily calories. Dairy Choose low-fat milk and dairy options. Dairy includes fat and protein, as well as calcium.  Fats and Oils Limit high-fat foods such as fried foods, sweets, baked goods, sugary drinks.  Other Creamy sauces and condiments, such as mayonnaise, can add extra fat. Think about whether or not  you need to use them, or use smaller amounts or low fat options. WHAT FOODS ARE NOT RECOMMENDED?  High fat foods, such as:  T[employer] Corporation[employer]aked goods.  Ice cream.  [place] toast.  Sweet rolls.  Pizza.  Cheese bread.  Foods covered with batter, butter, creamy sauces, or cheese.  Fried foods.  Sugary drinks and desserts.  Foods that cause gas or bloating   This information is not intended to replace advice given to you by your health care provider. Make sure you discuss any questions you have with your health care provider.   Document Released: 09/30/2013 Document Reviewed: 09/30/2013 Elsevier Interactive Patient Education 2016 [employer]  Hypoglycemia Hypoglycemia occurs when the glucose in your blood is too low. Glucose is a type of sugar that is your body's main energy source. Hormones, such as insulin and glucagon, control the level of glucose in the blood. Insulin lowers blood glucose and glucagon increases blood glucose. Having too much insulin in your blood stream, or not eating enough food containing sugar, can result in hypoglycemia. Hypoglycemia can happen to people with or without diabetes. It can develop quickly and can be a medical emergency.  CAUSES   Missing or delaying meals.  Not eating enough carbohydrates at meals.  Taking too much diabetes medicine.  Not timing your oral diabetes medicine or insulin doses with meals, snacks, and exercise.  Nausea and vomiting.  Certain medicines.  Severe illnesses, such as hepatitis, kidney disorders, and certain eating disorders.  Increased activity or  exercise without eating something extra or adjusting medicines.  Drinking too much alcohol.  A nerve disorder that affects body functions like your heart rate, blood pressure, and digestion (autonomic neuropathy).  A condition where the stomach muscles do not function properly (gastroparesis). Therefore, medicines and food may not absorb properly.  Rarely, a tumor of the  pancreas can produce too much insulin. SYMPTOMS   Hunger.  Sweating (diaphoresis).  Change in body temperature.  Shakiness.  Headache.  Anxiety.  Lightheadedness.  Irritability.  Difficulty concentrating.  Dry mouth.  Tingling or numbness in the hands or feet.  Restless sleep or sleep disturbances.  Altered speech and coordination.  Change in mental status.  Seizures or prolonged convulsions.  Combativeness.  Drowsiness (lethargic).  Weakness.  Increased heart rate or palpitations.  Confusion.  Pale, gray skin color.  Blurred or double vision.  Fainting. DIAGNOSIS  A physical exam and medical history will be performed. Your caregiver may make a diagnosis based on your symptoms. Blood tests and other lab tests may be performed to confirm a diagnosis. Once the diagnosis is made, your caregiver will see if your signs and symptoms go away once your blood glucose is raised.  TREATMENT  Usually, you can easily treat your hypoglycemia when you notice symptoms.  Check your blood glucose. If it is less than 70 mg/dl, take one of the following:   3-4 glucose tablets.    cup juice.    cup regular soda.   1 cup skim milk.   -1 tube of glucose gel.   5-6 hard candies.   Avoid high-fat drinks or food that may delay a rise in blood glucose levels.  Do not take more than the recommended amount of sugary foods, drinks, gel, or tablets. Doing so will cause your blood glucose to go too high.   Wait 10-15 minutes and recheck your blood glucose. If it is still less than 70 mg/dl or below your target range, repeat treatment.   Eat a snack if it is more than 1 hour until your next meal.  There may be a time when your blood glucose may go so low that you are unable to treat yourself at home when you start to notice symptoms. You may need someone to help you. You may even faint or be unable to swallow. If you cannot treat yourself, someone will need to  bring you to the hospital.  HOME CARE INSTRUCTIONS  If you have diabetes, follow your diabetes management plan by:  Taking your medicines as directed.  Following your exercise plan.  Following your meal plan. Do not skip meals. Eat on time.  Testing your blood glucose regularly. Check your blood glucose before and after exercise. If you exercise longer or different than usual, be sure to check blood glucose more frequently.  Wearing your medical alert jewelry that says you have diabetes.  Identify the cause of your hypoglycemia. Then, develop ways to prevent the recurrence of hypoglycemia.  Do not take a hot bath or shower right after an insulin shot.  Always carry treatment with you. Glucose tablets are the easiest to carry.  If you are going to drink alcohol, drink it only with meals.  Tell friends or family members ways to keep you safe during a seizure. This may include removing hard or sharp objects from the area or turning you on your side.  Maintain a healthy weight. SEEK MEDICAL CARE IF:   You are having problems keeping your blood glucose in  your target range.  You are having frequent episodes of hypoglycemia.  You feel you might be having side effects from your medicines.  You are not sure why your blood glucose is dropping so low.  You notice a change in vision or a new problem with your vision. SEEK IMMEDIATE MEDICAL CARE IF:   Confusion develops.  A change in mental status occurs.  The inability to swallow develops.  Fainting occurs.   This information is not intended to replace advice given to you by your health care provider. Make sure you discuss any questions you have with your health care provider.   Document Released: 09/25/2005 Document Revised: 09/30/2013 Document Reviewed: 06/01/2015 Elsevier Interactive Patient Education [employer]2016 Elsevier Inc.

## 2016-06-12 NOTE — Progress Notes (Signed)
Subjective:    Patient ID: Randy Ballard, male    DOB: 1961-08-21, 55 y.o.   MRN: [id number] Chief Complaint  Patient presents with  . Follow-up    DM follow up    HPI Randy Ballard is a 55 y.o. male who presents to [facility] presents for 6 month follow up on chronic medical conditions.  1) Diabetes A1c was 7.6 at last visit. Patient was going to increase his exercise regime. Has much decreased appetite - does eat breakfast but attributes it to the heat.  Does note some early satiety but no sig change from prior. He does keep busy so unintentionally skips some meals during the days. Bowels variable. Going to switch for vision works from [employer] [facility] - needs new glasses.  He did have one hypoglycemic episode and he has woken up feeling hypoglycemic - has gotten up to 240 - dropped to 118 after meds.  Did get new meter.  He has cut down to a low fat diet due to his h/o pancreatitis on Januvia which has occ flares up mildly.   2) Tobacco abuse He had started nicotine patches. Still smoking but cutting down - less than 1/2 ppd which he splits with his wife.  His wife uses a TBX strip under the tongue occ.  Forgets to put on the nictotine patches.  3) Obesity He's tried a smoothie replacement diet. He has a recombinant bike, treadmill, and elliptical machine at home.   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 Leta Baptist, last visit in Jan. He is to continue exercise and follow up annually. If he develops spasms, try baclofen and try levodopa for tremors and stiffness. He noteces that he has lost a lot of strength in his legs so will start the recombinant bike  5) Hypothyroid TSH has been stable on current regime for 3 years.   6) HTN He monitors blood pressure at home. BP 120s-130s/70s  7) HLD Last lipid panel 1 year was at goal with LDL of 66, non-HLD of 92.   8) Vitamin D deficiency  It was 25 at 1 year prior.  He started some berberine supp to help with DM  Past Medical History:  Diagnosis  Date  . Diabetes mellitus without complication ([place])   . Diverticulitis   . Hyperlipidemia   . Hypertension   . Hypogonadism male   . Neuromuscular disorder ([place])   . 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 Thyroid disease 02/06/2009   [place] s/p RAIU; cold nodule s/p negative biopsy   Past Surgical History:  Procedure Laterality Date  . BIOPSY THYROID  02/06/2009   cold thyroid nodule; negative/benign.  . COLONOSCOPY W/ POLYPECTOMY  Feb 2014   5 polyps  . TOOTH EXTRACTION  10/2015   x 4 teeth   Current Outpatient Prescriptions on File Prior to Visit  Medication Sig Dispense Refill  . aspirin 81 MG tablet Take 1 tablet (81 mg total) by mouth daily. 30 tablet 11  . Blood Glucose Monitoring Suppl (BLOOD GLUCOSE METER) kit Test blood sugar daily 1 each 0  . calcium-vitamin D (OSCAL WITH D) 500-200 MG-UNIT per tablet Take 1 tablet by mouth 2 (two) times daily.    Marland Kitchen glucose blood test strip Test blood sugar daily. 100 each 3  . Lancet Devices (LANCING DEVICE) MISC Test blood sugar daily. 1 each 0  . levothyroxine (SYNTHROID, LEVOTHROID) 125 MCG tablet Take 1 tablet (125 mcg total) by mouth daily before breakfast. 90  tablet 3   No current facility-administered medications on file prior to visit.    No Known Allergies Family History  Problem Relation Age of Onset  . Adopted: Yes  . Ataxia Mother 76    SCA3  . Ataxia Maternal Grandmother     SCA3  . Ataxia Maternal Aunt     SCA3   Social History   Social History  . Marital status: Married    Spouse name: Helene Kelp  . Number of children: 0  . Years of education: HS   Occupational History  . service rep   . retired Musician   Social History Main Topics  . Smoking status: Current Some Day Smoker    Packs/day: 0.50    Years: 32.00    Types: Cigarettes  . Smokeless tobacco: Never Used     Comment: 10/26/15 trying to quit  . Alcohol use No  . Drug use: No  . Sexual activity: Yes     Comment: 1 partner in last 12  months   Other Topics Concern  . None   Social History Narrative   Marital status:  Married x 20 years; happily.      Children: none      Lives: with wife.      Employment: delivers oxygen; works for [employer]; drives locally      Tobacco: trying to quit 10/2012.      Alcohol: None      Drugs: none      Exercise: treadmill sporadic.   Depression screen [facility] 2/9 05/25/2016 11/25/2015 06/10/2015 11/06/2014 03/06/2014  Decreased Interest 0 0 0 0 0  Down, Depressed, Hopeless 0 0 0 0 0  PHQ - 2 Score 0 0 0 0 0     Review of Systems  Constitutional: Positive for activity change. Negative for appetite change, chills, fever and unexpected weight change.  Respiratory: Negative for cough, chest tightness, shortness of breath and wheezing.   Gastrointestinal: Positive for abdominal pain and nausea. Negative for abdominal distention, blood in stool, constipation, diarrhea and vomiting.  Genitourinary: Negative for frequency.  Neurological: Positive for weakness and light-headedness. Negative for dizziness, syncope, speech difficulty, numbness and headaches.       Objective:   Physical Exam  Constitutional: He is oriented to person, place, and time. He appears well-developed and well-nourished. No distress.  HENT:  Head: Normocephalic and atraumatic.  Eyes: Conjunctivae are normal. Pupils are equal, round, and reactive to light. No scleral icterus.  Neck: Normal range of motion. Neck supple. No thyromegaly present.  Cardiovascular: Normal rate, regular rhythm, normal heart sounds and intact distal pulses.   Pulmonary/Chest: Effort normal and breath sounds normal. No respiratory distress.  Musculoskeletal: He exhibits no edema.  Lymphadenopathy:    He has no cervical adenopathy.  Neurological: He is alert and oriented to person, place, and time.  Skin: Skin is warm and dry. He is not diaphoretic.  Psychiatric: He has a normal mood and affect. His behavior is normal.     BP 122/78   Pulse 88    Temp 98.2 F (36.8 C) (Oral)   Resp 18   Ht 5' 7.5" (1.715 m)   Wt 212 lb 3.2 oz (96.3 kg)   SpO2 99%   BMI 32.74 kg/m      Results for orders placed or performed in visit on 05/25/16  Comprehensive metabolic panel  Result Value Ref Range   Sodium 138 135 - 146 mmol/L   Potassium 4.4 3.5 - 5.3 mmol/L  Chloride 99 98 - 110 mmol/L   CO2 24 20 - 31 mmol/L   Glucose, Bld 161 (H) 65 - 99 mg/dL   BUN 16 7 - 25 mg/dL   Creat 0.99 0.70 - 1.33 mg/dL   Total Bilirubin 0.6 0.2 - 1.2 mg/dL   Alkaline Phosphatase 107 40 - 115 U/L   AST 18 10 - 35 U/L   ALT 21 9 - 46 U/L   Total Protein 7.6 6.1 - 8.1 g/dL   Albumin 4.6 3.6 - 5.1 g/dL   Calcium 9.9 8.6 - 10.3 mg/dL  Lipid panel  Result Value Ref Range   Cholesterol 114 (L) 125 - 200 mg/dL   Triglycerides 131 <150 mg/dL   HDL 29 (L) >=40 mg/dL   Total CHOL/HDL Ratio 3.9 <=5.0 Ratio   VLDL 26 <30 mg/dL   LDL Cholesterol 59 <130 mg/dL  TSH  Result Value Ref Range   TSH 2.90 0.40 - 4.50 mIU/L  Microalbumin/Creatinine Ratio, Urine  Result Value Ref Range   Creatinine, Urine 278 20 - 370 mg/dL   Microalb, Ur 8.9 Not estab mg/dL   Microalb Creat Ratio 32 (H) <30 mcg/mg creat  CBC  Result Value Ref Range   WBC 10.1 3.8 - 10.8 K/uL   RBC 4.40 4.20 - 5.80 MIL/uL   Hemoglobin 13.7 13.2 - 17.1 g/dL   HCT 40.6 38.5 - 50.0 %   MCV 92.3 80.0 - 100.0 fL   MCH 31.1 27.0 - 33.0 pg   MCHC 33.7 32.0 - 36.0 g/dL   RDW 12.0 11.0 - 15.0 %   Platelets 305 140 - 400 K/uL   MPV 10.8 7.5 - 12.5 fL  Lipase  Result Value Ref Range   Lipase 39 7 - 60 U/L  POCT glycosylated hemoglobin (Hb A1C)  Result Value Ref Range   Hemoglobin A1C 7.5     Assessment & Plan:  Patient is due for full physical at his next visit.  Needs optho exam - requested pt have a copy of note sent to me,  decreaes glimeperide from 8 -> 4 Start zantac prn Stick on low fat diet due to h/o pancreatitis 1. Type 2 diabetes mellitus with complication, without long-term  current use of insulin ([place])   2. Tobacco abuse - trying to cut down  3. BMI 34.0-34.9,adult   4. SCA-3 (spinocerebellar ataxia type 3) ([place])   5. 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 Essential hypertension   7. Dyslipidemia   8. Vitamin D deficiency   9. Abdominal pain, epigastric   10. Type 2 diabetes mellitus with diabetic polyneuropathy, without long-term current use of insulin ([place])   11. Essential hypertension, benign   12. Hyperlipidemia LDL goal <100     Orders Placed This Encounter  Procedures  . Comprehensive metabolic panel    Order Specific Question:   Has the patient fasted?    Answer:   Yes  . Lipid panel    Order Specific Question:   Has the patient fasted?    Answer:   Yes  . TSH  . Microalbumin/Creatinine Ratio, Urine  . CBC  . Lipase  . POCT glycosylated hemoglobin (Hb A1C)    Meds ordered this encounter  Medications  . ranitidine (ZANTAC) 150 MG tablet    Sig: Take 1 tablet (150 mg total) by mouth 2 (two) times daily as needed for heartburn.    Dispense:  60 tablet    Refill:  5  . metoprolol succinate (  TOPROL-XL) 100 MG 24 hr tablet    Sig: Take 1 tablet (100 mg total) by mouth daily. Take with or immediately following a meal.    Dispense:  30 tablet    Refill:  5  . metFORMIN (GLUCOPHAGE) 1000 MG tablet    Sig: Take 1 tablet (1,000 mg total) by mouth 2 (two) times daily with a meal.    Dispense:  60 tablet    Refill:  5  . lisinopril-hydrochlorothiazide (PRINZIDE,ZESTORETIC) 20-25 MG tablet    Sig: Take 1 tablet by mouth daily.    Dispense:  30 tablet    Refill:  5  . atorvastatin (LIPITOR) 40 MG tablet    Sig: Take 1 tablet (40 mg total) by mouth daily.    Dispense:  30 tablet    Refill:  5  . glimepiride (AMARYL) 4 MG tablet    Sig: Take 1 tablet (4 mg total) by mouth daily with breakfast.    Dispense:  30 tablet    Refill:  5    [name], M.D.  [facility] [address] [place], [place] [address] [phone] phone [phone] fax  06/12/16 8:32 AM

## 2016-07-19 DIAGNOSIS — K080 Exfoliation of teeth due to systemic causes: Secondary | ICD-10-CM | POA: Diagnosis not present

## 2016-07-25 DIAGNOSIS — K080 Exfoliation of teeth due to systemic causes: Secondary | ICD-10-CM | POA: Diagnosis not present

## 2016-08-03 DIAGNOSIS — K080 Exfoliation of teeth due to systemic causes: Secondary | ICD-10-CM | POA: Diagnosis not present

## 2016-08-15 DIAGNOSIS — K080 Exfoliation of teeth due to systemic causes: Secondary | ICD-10-CM | POA: Diagnosis not present

## 2016-09-18 DIAGNOSIS — K080 Exfoliation of teeth due to systemic causes: Secondary | ICD-10-CM | POA: Diagnosis not present

## 2016-10-24 DIAGNOSIS — K080 Exfoliation of teeth due to systemic causes: Secondary | ICD-10-CM | POA: Diagnosis not present

## 2016-10-27 ENCOUNTER — Ambulatory Visit: Payer: Federal, State, Local not specified - PPO | Admitting: Diagnostic Neuroimaging

## 2016-10-27 ENCOUNTER — Telehealth: Payer: Self-pay | Admitting: *Deleted

## 2016-10-27 NOTE — Telephone Encounter (Signed)
[facility], billing dept, pt wanted to reschedule his one year FU today due to weather. Left VM with office number  requesting him to call back and reschedule with our phone staff.

## 2016-11-02 DIAGNOSIS — K080 Exfoliation of teeth due to systemic causes: Secondary | ICD-10-CM | POA: Diagnosis not present

## 2016-11-23 DIAGNOSIS — K080 Exfoliation of teeth due to systemic causes: Secondary | ICD-10-CM | POA: Diagnosis not present

## 2016-11-29 NOTE — Progress Notes (Signed)
Subjective:    Patient ID: Randy Ballard, male    DOB: 11/04/1960, 56 y.o.   MRN: [id number] Chief Complaint  Patient presents with  . Annual Exam    HPI  Randy Ballard is a delightful 56 year old male who is here for his complete physical today. His last CPE was 06/10/15.  Primary Preventative Screenings: Prostate Cancer: Last PSA was 09/16 0.71 - no nocturia or sxs. STI screening: Colorectal Cancer: colonoscopy with 5 polyps 11/2012 by Dr. Benson Norway, repeat in 5-60yr. H/o tobacco use AAA/Lung: Qualifies for screening lung CT - he never called to get scheduled. Cardiac: no sxs, last EKG 10/26/2014 showed freq PACs but he has noticed less of these sxs Weight/blood sugar: OTC/vit/supp/herbal: Diet/Exercise: Dentist/Optho: getting a lot of dental work. Immunizations: tdap 2015, pneumovax-23 2014, [place] Vitamin D def: Not on supp but plans to restart. Level was 25 > 1 yr prior. Tobacco abuse: 1 ppd x 32 yrs, trying to quit, his wife smokes as well. Used nicotine patches to cut down at last visit - had been able to decrease to 1/2 ppd. Occ he will smoke less.  He went to a smoking cessation program in [place] Anemia: unknown etiology - prior w/u was nml with nml B12, folate, iron. Resolved with last labs. DMII:  hgba1c 7.6<- 7.5 <- 7.6 <- 7.7. He has not been checking his blood sugar. Because patient was having some hypoglycemic episodes at his last visit we decreased his glimepiride from 8 mg to 4 mg daily and continued metformin 1 g twice a day. No further hypoglycemic episodes. Sees podiatry.  Missed last optho visit - vision works so overdue.  Exercise difficult due to muscular dystrophy but still tries to walk using shopping cart, gardens. Pt did have a mild episode of pancreatitis sev yrs ago which was poss due to Januvia but no current sxs.  He continues to try to eat a low fat diet and a plant based diet with proteins high  in omega-3s.  Microalb slightly elev 6 mos prior - on acei lisinopril. Will restart asa. HTN: On lisinopril-hctz 20-25 and Toprol 1060mqd. Monitors blood pressure at home - 130-135/75. HLD:  On lipitor 4010mLipid panel has been at goal for sev years. Last 6 mos prior showed LDL of 59, non-HLD of 85.  Hypothyroid (Graves s/p RAIU 2010): Has been stable on levothyroxine 125 g for over 3 years Chronic back pain: lumbar DDD, currently flaired Chronic constipation: senokot S prn. Things have been normal recently. 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 If he develops spasms, try baclofen and try levodopa for tremors and stiffness. He is getting more dependent on the rolling walker. GERD: prn zantac  Loves to garden and plays guitar.  He has a recombinant bike and treadmill at home for exercise.   Past Medical History:  Diagnosis Date  . Diabetes mellitus without complication ([place] . Diverticulitis   . Hyperlipidemia   . Hypertension   . Hypogonadism male   . Neuromuscular disorder ([facility] . SCA-3 (spinocerebellar ataxia type 3) ([facility] . Thyroid disease 02/06/2009   [place]ves s/p RAIU; cold nodule s/p negative biopsy   Past Surgical History:  Procedure Laterality Date  . BIOPSY THYROID  02/06/2009   cold thyroid nodule; negative/benign.  . COLONOSCOPY W/ POLYPECTOMY  Feb 2014   5 polyps  . TOOTH EXTRACTION  10/2015   x 4 teeth   Current Outpatient Prescriptions on File Prior to Visit  Medication Sig Dispense Refill  . aspirin 81 MG tablet Take 1 tablet (81 mg total) by mouth daily. 30 tablet 11  . Blood Glucose Monitoring Suppl (BLOOD GLUCOSE METER) kit Test blood sugar daily 1 each 0  . calcium-vitamin D (OSCAL WITH D) 500-200 MG-UNIT per tablet Take 1 tablet by mouth 2 (two) times daily.    Marland Kitchen glucose blood test strip Test blood sugar daily. 100 each 3  . Lanc[employer] Devices (LANCING DEVICE) MISC Test blood sugar daily. 1 each 0  . ranitidine  (ZANTAC) 150 MG tablet Take 1 tablet (150 mg total) by mouth 2 (two) times daily as needed for heartburn. 60 tablet 5   No current facility-administered medications on file prior to visit.    Allergies  Allergen Reactions  . Januvia [Sitagliptin] Nausea And Vomiting    pancreatitis   Family History  Problem Relation Age of Onset  . Adopted: Yes  . Ataxia Mother 21    SCA3  . Ataxia Maternal Grandmother     SCA3  . Ataxia Maternal Aunt     SCA3   Social History   Social History  . Marital status: Married    Spouse name: Randy Ballard  . Number of children: 0  . Years of education: HS   Occupational History  . service rep   . retired Musician   Social History Main Topics  . Smoking status: Current Some Day Smoker    Packs/day: 0.50    Years: 32.00    Types: Cigarettes  . Smokeless tobacco: Never Used     Comment: 10/26/15 trying to quit  . Alcohol use No  . Drug use: No  . Sexual activity: Yes     Comment: 1 partner in last 12 months   Other Topics Concern  . None   Social History Narrative   Marital status:  Married x 20 years; happily.      Children: none      Lives: with wife.      Employment: delivers oxygen; works for [employer]; drives locally      Tobacco: trying to quit 10/2012.      Alcohol: None      Drugs: none      Exercise: treadmill sporadic.   Depression screen [facility] 2/9 11/30/2016 05/25/2016 11/25/2015 06/10/2015 11/06/2014  Decreased Interest 0 0 0 0 0  Down, Depressed, Hopeless 0 0 0 0 0  PHQ - 2 Score 0 0 0 0 0    Review of Systems  All other systems reviewed and are negative. See hpi    Objective:   Physical Exam  Constitutional: He is oriented to person, place, and time. He appears well-developed and well-nourished. No distress.  HENT:  Head: Normocephalic and atraumatic.  Right Ear: Tympanic membrane, external ear and ear canal normal.  Left Ear: Tympanic membrane, external ear and ear canal normal.  Nose: Nose normal.  Mouth/Throat: Uvula is  midline, oropharynx is clear and moist and mucous membranes are normal. No oropharyngeal exudate.  Eyes: Conjunctivae are normal. Right eye exhibits no discharge. Left eye exhibits no discharge. No scleral icterus.  Neck: Normal range of motion. Neck supple. No thyromegaly present.  Cardiovascular: Normal rate, regular rhythm, normal heart sounds and intact distal pulses.   Pulmonary/Chest: Effort normal and breath sounds normal. No respiratory distress.  Abdominal: Soft. Bowel sounds are normal. He exhibits no distension and no mass.  There is no tenderness. There is no rebound and no guarding.  Genitourinary: Rectum normal and prostate normal. Rectal exam shows no mass, no tenderness and anal tone normal. Prostate is not enlarged and not tender.  Musculoskeletal: He exhibits no edema.       Lumbar back: He exhibits decreased range of motion. He exhibits no tenderness, no bony tenderness, no swelling and no deformity.  Lymphadenopathy:    He has no cervical adenopathy.  Neurological: He is alert and oriented to person, place, and time. He has normal reflexes. He displays atrophy. No cranial nerve deficit. He exhibits normal muscle tone. Coordination and gait abnormal.  Slowed gait, difficulty sitting up from exam bed Unable to elicit B patellar DTRs  Skin: Skin is warm and dry. No rash noted. He is not diaphoretic. No erythema.  All toenails thickened and hypertrophied but first toenails B are very large  Psychiatric: He has a normal mood and affect. His behavior is normal.    BP 134/72 (BP Location: Right Arm, Patient Position: Sitting, Cuff Size: Large)   Pulse 89   Temp 98.7 F (37.1 C) (Oral)   Resp 18   Ht 5' 7.5" (1.715 m)   Wt 240 lb (108.9 kg)   SpO2 99%   BMI 37.03 kg/m     [facility] reading (PRIMARY) by  Dr. Brigitte Pulse. EKG: NSR, with 1 PAC Assessment & Plan:  Make appt with optho A1c, cmp, tsh, lipid, cbc, psa, ua,  Refer for lung ca screening CT 1. Annual physical exam   2.  Screening for cardiovascular, respiratory, and genitourinary diseases   3. Screening for deficiency anemia   4. Screening for prostate cancer   5. Essential hypertension   6. Type 2 diabetes mellitus with complication, without long-term current use of insulin (HCC) - cont current regimen, pt plans to increase his activity level, a1c 7.6 today - unchanged even though we cut his glimepiride in half at last visit!!  7. 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 SCA-3 (spinocerebellar ataxia type 3) ([place]) - needs to restart exercise - quad strengthening  9. Dyslipidemia   10. BMI 34.0-34.9,adult   11. Tobacco abuse   12. Medication monitoring encounter   13. Onychomycosis - sched for toenail removal - DM very well controlled and nails causing sig discomfort and limiting shoe wear, he is using topical treatments daily w/ little success  14. Hyperlipidemia LDL goal <100   15. Type 2 diabetes mellitus with diabetic polyneuropathy, without long-term current use of insulin ([place])   16. Essential hypertension, benign   17.     Chronic low back pain - try etodolac, eGFR 98  Orders Placed This Encounter  Procedures  . CBC  . Comprehensive metabolic panel    Order Specific Question:   Has the patient fasted?    Answer:   Yes  . TSH  . Lipid panel    Order Specific Question:   Has the patient fasted?    Answer:   Yes  . PSA  . Ambulatory Referral for Lung Cancer Scre    Referral Priority:   Routine    Referral Type:   Consultation    Referral Reason:   Specialty Services Required    Number of Visits Requested:   1  . POCT urinalysis dipstick  . POCT glycosylated hemoglobin (Hb A1C)  . EKG 12-Lead    Meds ordered this encounter  Medications  . atorvastatin (LIPITOR) 40 MG tablet    Sig:  Take 1 tablet (40 mg total) by mouth daily.    Dispense:  30 tablet    Refill:  5  . glimepiride (AMARYL) 4 MG tablet    Sig: Take 1 tablet (4 mg total) by mouth daily with breakfast.      Dispense:  30 tablet    Refill:  5  . levothyroxine (SYNTHROID, LEVOTHROID) 125 MCG tablet    Sig: Take 1 tablet (125 mcg total) by mouth daily before breakfast.    Dispense:  90 tablet    Refill:  3  . lisinopril-hydrochlorothiazide (PRINZIDE,ZESTORETIC) 20-25 MG tablet    Sig: Take 1 tablet by mouth daily.    Dispense:  30 tablet    Refill:  5  . metFORMIN (GLUCOPHAGE) 1000 MG tablet    Sig: Take 1 tablet (1,000 mg total) by mouth 2 (two) times daily with a meal.    Dispense:  60 tablet    Refill:  5  . metoprolol succinate (TOPROL-XL) 100 MG 24 hr tablet    Sig: Take 1 tablet (100 mg total) by mouth daily. Take with or immediately following a meal.    Dispense:  30 tablet    Refill:  5     [name], M.D.  Primary Care at [facility] [address] [place], [place] [address] [phone] phone [phone] fax  11/30/16 9:16 AM

## 2016-11-30 ENCOUNTER — Ambulatory Visit (INDEPENDENT_AMBULATORY_CARE_PROVIDER_SITE_OTHER): Payer: Federal, State, Local not specified - PPO | Admitting: Family Medicine

## 2016-11-30 ENCOUNTER — Encounter: Payer: Self-pay | Admitting: Family Medicine

## 2016-11-30 VITALS — BP 134/72 | HR 89 | Temp 98.7°F | Resp 18 | Ht 67.5 in | Wt 240.0 lb

## 2016-11-30 DIAGNOSIS — I10 Essential (primary) hypertension: Secondary | ICD-10-CM

## 2016-11-30 DIAGNOSIS — G8929 Other chronic pain: Secondary | ICD-10-CM

## 2016-11-30 DIAGNOSIS — G118 Other hereditary ataxias: Secondary | ICD-10-CM

## 2016-11-30 DIAGNOSIS — Z125 Encounter for screening for malignant neoplasm of prostate: Secondary | ICD-10-CM | POA: Diagnosis not present

## 2016-11-30 DIAGNOSIS — E118 Type 2 diabetes mellitus with unspecified complications: Secondary | ICD-10-CM | POA: Diagnosis not present

## 2016-11-30 DIAGNOSIS — Z130 Encounter for screening for diseases of the blood and blood-forming organs and certain disorders involving the immune mechanism: Secondary | ICD-10-CM | POA: Diagnosis not present

## 2016-11-30 DIAGNOSIS — E785 Hyperlipidemia, unspecified: Secondary | ICD-10-CM

## 2016-11-30 DIAGNOSIS — M545 Low back pain, unspecified: Secondary | ICD-10-CM

## 2016-11-30 DIAGNOSIS — Z1389 Encounter for screening for other disorder: Secondary | ICD-10-CM

## 2016-11-30 DIAGNOSIS — E1142 Type 2 diabetes mellitus with diabetic polyneuropathy: Secondary | ICD-10-CM

## 2016-11-30 DIAGNOSIS — Z0000 Encounter for general adult medical examination without abnormal findings: Secondary | ICD-10-CM | POA: Diagnosis not present

## 2016-11-30 DIAGNOSIS — Z1383 Encounter for screening for respiratory disorder NEC: Secondary | ICD-10-CM | POA: Diagnosis not present

## 2016-11-30 DIAGNOSIS — E032 Hypothyroidism due to medicaments and other exogenous substances: Secondary | ICD-10-CM

## 2016-11-30 DIAGNOSIS — Z5181 Encounter for therapeutic drug level monitoring: Secondary | ICD-10-CM

## 2016-11-30 DIAGNOSIS — Z136 Encounter for screening for cardiovascular disorders: Secondary | ICD-10-CM | POA: Diagnosis not present

## 2016-11-30 DIAGNOSIS — Z720 Tobacco use: Secondary | ICD-10-CM | POA: Diagnosis not present

## 2016-11-30 DIAGNOSIS — Z6834 Body mass index (BMI) 34.0-34.9, adult: Secondary | ICD-10-CM

## 2016-11-30 DIAGNOSIS — B351 Tinea unguium: Secondary | ICD-10-CM

## 2016-11-30 LAB — POCT URINALYSIS DIP (MANUAL ENTRY)
Bilirubin, UA: NEGATIVE
GLUCOSE UA: NEGATIVE
Ketones, POC UA: NEGATIVE
Leukocytes, UA: NEGATIVE
NITRITE UA: NEGATIVE
PH UA: 6
RBC UA: NEGATIVE
SPEC GRAV UA: 1.015
UROBILINOGEN UA: 0.2

## 2016-11-30 LAB — POCT GLYCOSYLATED HEMOGLOBIN (HGB A1C): HEMOGLOBIN A1C: 7.6

## 2016-11-30 MED ORDER — ATORVASTATIN CALCIUM 40 MG PO TABS: 40.0000 mg | ORAL_TABLET | Freq: Every day | ORAL | 5 refills | Status: DC

## 2016-11-30 MED ORDER — METFORMIN HCL 1000 MG PO TABS: 1000.0000 mg | ORAL_TABLET | Freq: Two times a day (BID) | ORAL | 5 refills | Status: DC

## 2016-11-30 MED ORDER — LISINOPRIL-HYDROCHLOROTHIAZIDE 20-25 MG PO TABS: 1.0000 | ORAL_TABLET | Freq: Every day | ORAL | 5 refills | Status: DC

## 2016-11-30 MED ORDER — GLIMEPIRIDE 4 MG PO TABS: 4.0000 mg | ORAL_TABLET | Freq: Every day | ORAL | 5 refills | Status: DC

## 2016-11-30 MED ORDER — LEVOTHYROXINE SODIUM 125 MCG PO TABS: 125.0000 ug | ORAL_TABLET | Freq: Every day | ORAL | 3 refills | Status: DC

## 2016-11-30 MED ORDER — ETODOLAC 200 MG PO CAPS: 200.0000 mg | ORAL_CAPSULE | Freq: Two times a day (BID) | ORAL | 1 refills | Status: DC | PRN

## 2016-11-30 MED ORDER — METOPROLOL SUCCINATE ER 100 MG PO TB24: 100.0000 mg | ORAL_TABLET | Freq: Every day | ORAL | 5 refills | Status: DC

## 2016-11-30 NOTE — Patient Instructions (Addendum)
Whenever you would like to get your great toenails removed with in the week, just call our main phone number at [phone] and ask to be scheduled with one of our physician assistants for toenail removal.  Remember to make an appointment with your eye doctor before your next visit in 4 months.   IF you received an x-ray today, you will receive an invoice from [facility]. Please contact [facility] at [phone] with questions or concerns regarding your invoice.   IF you received labwork today, you will receive an invoice from [facility]. Please contact [facility] at [phone] with questions or concerns regarding your invoice.   Our billing staff will not be able to assist you with questions regarding bills from these companies.  You will be contacted with the lab results as soon as they are available. The fastest way to get your results is to activate your My Chart account. Instructions are located on the last page of this paperwork. If you have not heard from [place] regarding the results in 2 weeks, please contact this office.     Chronic Back Pain When back pain lasts longer than 3 months, it is called chronic back pain.The cause of your back pain may not be known. Some common causes include:  Wear and tear (degenerative disease) of the bones, ligaments, or disks in your back.  Inflammation and stiffness in your back (arthritis). People who have chronic back pain often go through certain periods in which the pain is more intense (flare-ups). Many people can learn to manage the pain with home care. Follow these instructions at home: Pay attention to any changes in your symptoms. Take these actions to help with your pain: Activity  Avoid bending and activities that make the problem worse.  Do not sit or stand in one place for long periods of time.  Take brief periods of rest throughout the day. This will reduce your pain. Resting in a lying or standing position is usually  better than sitting to rest.  When you are resting for longer periods, mix in some mild activity or stretching between periods of rest. This will help to prevent stiffness and pain.  Get regular exercise. Ask your health care provider what activities are safe for you.  Do not lift anything that is heavier than 10 lb (4.5 kg). Always use proper lifting technique, which includes:  Bending your knees.  Keeping the load close to your body.  Avoiding twisting. Managing pain  If directed, apply ice to the painful area. Your health care provider may recommend applying ice during the first 24-48 hours after a flare-up begins.  Put ice in a plastic bag.  Place a towel between your skin and the bag.  Leave the ice on for 20 minutes, 2-3 times per day.  After icing, apply heat to the affected area as often as told by your health care provider. Use the heat source that your health care provider recommends, such as a moist heat pack or a heating pad.  Place a towel between your skin and the heat source.  Leave the heat on for 20-30 minutes.  Remove the heat if your skin turns bright red. This is especially important if you are unable to feel pain, heat, or cold. You may have a greater risk of getting burned.  Try soaking in a warm tub.  Take over-the-counter and prescription medicines only as told by your health care provider.  Keep all follow-up visits as told by your health  care provider. This is important. Contact a health care provider if:  You have pain that is not relieved with rest or medicine. Get help right away if:  You have weakness or numbness in one or both of your legs or feet.  You have trouble controlling your bladder or your bowels.  You have nausea or vomiting.  You have pain in your abdomen.  You have shortness of breath or you faint. This information is not intended to replace advice given to you by your health care provider. Make sure you discuss any  questions you have with your health care provider. Document Released: 11/02/2004 Document Revised: 02/03/2016 Document Reviewed: 03/15/2015 Elsevier Interactive Patient Education  2017 [employer]

## 2016-12-01 LAB — COMPREHENSIVE METABOLIC PANEL
ALBUMIN: 4.8 g/dL (ref 3.5–5.5)
ALK PHOS: 87 IU/L (ref 39–117)
ALT: 39 IU/L (ref 0–44)
AST: 30 IU/L (ref 0–40)
Albumin/Globulin Ratio: 1.7 (ref 1.2–2.2)
BILIRUBIN TOTAL: 0.4 mg/dL (ref 0.0–1.2)
BUN / CREAT RATIO: 11 (ref 9–20)
BUN: 9 mg/dL (ref 6–24)
CHLORIDE: 97 mmol/L (ref 96–106)
CO2: 27 mmol/L (ref 18–29)
Calcium: 9.9 mg/dL (ref 8.7–10.2)
Creatinine, Ser: 0.83 mg/dL (ref 0.76–1.27)
GFR calc Af Amer: 114 (ref >59)
GFR calc non Af Amer: 98 (ref >59)
GLUCOSE: 139 mg/dL — AB (ref 65–99)
Globulin, Total: 2.8 (ref 1.5–4.5)
Potassium: 4.7 mmol/L (ref 3.5–5.2)
Sodium: 142 mmol/L (ref 134–144)
Total Protein: 7.6 g/dL (ref 6.0–8.5)

## 2016-12-01 LAB — CBC
Hematocrit: 40.7 % (ref 37.5–51.0)
Hemoglobin: 13.6 g/dL (ref 13.0–17.7)
MCH: 31.1 pg (ref 26.6–33.0)
MCHC: 33.4 g/dL (ref 31.5–35.7)
MCV: 93 fL (ref 79–97)
PLATELETS: 307 10*3/uL (ref 150–379)
RBC: 4.37 x10E6/uL (ref 4.14–5.80)
RDW: 13 % (ref 12.3–15.4)
WBC: 10.3 10*3/uL (ref 3.4–10.8)

## 2016-12-01 LAB — PSA: PROSTATE SPECIFIC AG, SERUM: 0.7 ng/mL (ref 0.0–4.0)

## 2016-12-01 LAB — LIPID PANEL
Chol/HDL Ratio: 4.2 (ref 0.0–5.0)
Cholesterol, Total: 152 mg/dL (ref 100–199)
HDL: 36 mg/dL — AB (ref >39)
LDL Calculated: 88 (ref 0–99)
Triglycerides: 142 mg/dL (ref 0–149)
VLDL Cholesterol Cal: 28 (ref 5–40)

## 2016-12-01 LAB — TSH: TSH: 4.83 u[IU]/mL — AB (ref 0.450–4.500)

## 2016-12-05 ENCOUNTER — Telehealth: Payer: Self-pay | Admitting: *Deleted

## 2016-12-05 NOTE — Telephone Encounter (Signed)
Received referral for low dose lung cancer screening CT scan. Voicemail left at phone number listed in EMR for patient to call me back to facilitate scheduling scan as well as giving information that scan can [place] done in [place] or [place].

## 2016-12-12 ENCOUNTER — Telehealth: Payer: Self-pay | Admitting: *Deleted

## 2016-12-12 NOTE — Telephone Encounter (Signed)
Received referral for low dose lung cancer screening CT scan. Voicemail left at phone number listed in EMR for patient to call me back to facilitate scheduling scan.  

## 2017-01-01 ENCOUNTER — Encounter: Payer: Self-pay | Admitting: *Deleted

## 2017-01-08 ENCOUNTER — Encounter (INDEPENDENT_AMBULATORY_CARE_PROVIDER_SITE_OTHER): Payer: Federal, State, Local not specified - PPO | Admitting: Family Medicine

## 2017-01-08 NOTE — Patient Instructions (Signed)
     IF you received an x-ray today, you will receive an invoice from [facility]. Please contact [facility] at [phone] with questions or concerns regarding your invoice.   IF you received labwork today, you will receive an invoice from [facility]. Please contact [facility] at [phone] with questions or concerns regarding your invoice.   Our billing staff will not be able to assist you with questions regarding bills from these companies.  You will be contacted with the lab results as soon as they are available. The fastest way to get your results is to activate your My Chart account. Instructions are located on the last page of this paperwork. If you have not heard from us regarding the results in 2 weeks, please contact this office.     

## 2017-01-12 ENCOUNTER — Ambulatory Visit (INDEPENDENT_AMBULATORY_CARE_PROVIDER_SITE_OTHER): Payer: Federal, State, Local not specified - PPO | Admitting: Physician Assistant

## 2017-01-12 VITALS — BP 158/76 | HR 83 | Temp 98.3°F | Resp 17 | Ht 67.5 in | Wt 219.0 lb

## 2017-01-12 DIAGNOSIS — B351 Tinea unguium: Secondary | ICD-10-CM | POA: Diagnosis not present

## 2017-01-12 NOTE — Patient Instructions (Addendum)
Please follow up with podiatry. I have placed a referral and they should contact you with an appointment. Please discuss with them what you are wanting to do at the appointment so they can make sure they are equipped for that. If you have any other questions or concerns please let me know. Thank you for letting me participate in your health and well being.  Fungal Nail Infection Fungal nail infection is a common fungal infection of the toenails or fingernails. This condition affects toenails more often than fingernails. More than one nail may be infected. The condition can be passed from person to person (is contagious). What are the causes? This condition is caused by a fungus. Several types of funguses can cause the infection. These funguses are common in moist and warm areas. If your hands or feet come into contact with the fungus, it may get into a crack in your fingernail or toenail and cause the infection. What increases the risk? The following factors may make you more likely to develop this condition:  Being male.  Having diabetes.  Being of older age.  Living with someone who has the fungus.  Walking barefoot in areas where the fungus thrives, such as showers or locker rooms.  Having poor circulation.  Wearing shoes and socks that cause your feet to sweat.  Having athlete's foot.  Having a nail injury or history of a recent nail surgery.  Having psoriasis.  Having a weak body defense system (immune system). What are the signs or symptoms? Symptoms of this condition include:  A pale spot on the nail.  Thickening of the nail.  A nail that becomes yellow or brown.  A brittle or ragged nail edge.  A crumbling nail.  A nail that has lifted away from the nail bed. How is this diagnosed? This condition is diagnosed with a physical exam. Your health care provider may take a scraping or clipping from your nail to test for the fungus. How is this treated? Mild infections  do not need treatment. If you have significant nail changes, treatment may include:  Oral antifungal medicines. You may need to take the medicine for several weeks or several months, and you may not see the results for a long time. These medicines can cause side effects. Ask your health care provider what problems to watch for.  Antifungal nail polish and nail cream. These may be used along with oral antifungal medicines.  Laser treatment of the nail.  Surgery to remove the nail. This may be needed for the most severe infections. Treatment takes a long time, and the infection may come back. Follow these instructions at home: Medicines   Take or apply over-the-counter and prescription medicines only as told by your health care provider.  Ask your health care provider about using over-the-counter mentholated ointment on your nails. Lifestyle    Do not share personal items, such as towels or nail clippers.  Trim your nails often.  Wash and dry your hands and feet every day.  Wear absorbent socks, and change your socks frequently.  Wear shoes that allow air to circulate, such as sandals or canvas tennis shoes. Throw out old shoes.  Wear rubber gloves if you are working with your hands in wet areas.  Do not walk barefoot in shower rooms or locker rooms.  Do not use a nail salon that does not use clean instruments.  Do not use artificial nails. General instructions   Keep all follow-up visits as told by  your health care provider. This is important.  Use antifungal foot powder on your feet and in your shoes. Contact a health care provider if: Your infection is not getting better or it is getting worse after several months. This information is not intended to replace advice given to you by your health care provider. Make sure you discuss any questions you have with your health care provider. Document Released: 09/22/2000 Document Revised: 03/02/2016 Document Reviewed:  03/29/2015 Elsevier Interactive Patient Education  2017 [employer].     IF you received an x-ray today, you will receive an invoice from [facility]. Please contact [facility] at [phone] with questions or concerns regarding your invoice.   IF you received labwork today, you will receive an invoice from [facility]. Please contact [facility] at [phone] with questions or concerns regarding your invoice.   Our billing staff will not be able to assist you with questions regarding bills from these companies.  You will be contacted with the lab results as soon as they are available. The fastest way to get your results is to activate your My Chart account. Instructions are located on the last page of this paperwork. If you have not heard from [place] regarding the results in 2 weeks, please contact this office.

## 2017-01-12 NOTE — Progress Notes (Signed)
ANEUDY CHAMPLAIN  MRN: [id number] DOB: 26-Feb-1961  Subjective:  Randy Ballard is a 56 y.o. male seen in office today for a chief complaint of bilateral big toenail removal. Notes he has hx of toenail fungus and over the years it has caused his big toenails to get extremely thick. During his annual physical exam with Dr. Brigitte Pulse, she mentioned he should just come to our office to have both big toenails removed. He is not having any pain, purulent drainage, or ingrown toenail. He has seen podiatry in the past for his nail fungus and notes it was under control when following with them regularly, however he stopped going and notes this is the worse his toenails have ever been.  Review of Systems  Constitutional: Negative for chills, diaphoresis and fever.    Patient Active Problem List   Diagnosis Date Noted  . Tobacco abuse 11/06/2014  . SCA-3 (spinocerebellar ataxia type 3) ([place]) 12/17/2013  . Acute pancreatitis 04/16/2013  . Leukocytosis 04/16/2013  . BMI 34.0-34.9,adult 02/05/2012  . HTN (hypertension) 12/23/2011  . DM type 2 (diabetes mellitus, type 2) ([place]) 12/23/2011  . Hypothyroid 12/23/2011  . Dyslipidemia 12/23/2011    Current Outpatient Prescriptions on File Prior to Visit  Medication Sig Dispense Refill  . aspirin 81 MG tablet Take 1 tablet (81 mg total) by mouth daily. 30 tablet 11  . atorvastatin (LIPITOR) 40 MG tablet Take 1 tablet (40 mg total) by mouth daily. 30 tablet 5  . Blood Glucose Monitoring Suppl (BLOOD GLUCOSE METER) kit Test blood sugar daily 1 each 0  . calcium-vitamin D (OSCAL WITH D) 500-200 MG-UNIT per tablet Take 1 tablet by mouth 2 (two) times daily.    Marland Kitchen etodolac (LODINE) 200 MG capsule Take 1-2 capsules (200-400 mg total) by mouth 2 (two) times daily as needed (back pain). 60 capsule 1  . glimepiride (AMARYL) 4 MG tablet Take 1 tablet (4 mg total) by mouth daily with breakfast. 30 tablet 5  . glucose blood test strip Test blood sugar daily. 100 each 3    . Lancet Devices (LANCING DEVICE) MISC Test blood sugar daily. 1 each 0  . levothyroxine (SYNTHROID, LEVOTHROID) 125 MCG tablet Take 1 tablet (125 mcg total) by mouth daily before breakfast. 90 tablet 3  . lisinopril-hydrochlorothiazide (PRINZIDE,ZESTORETIC) 20-25 MG tablet Take 1 tablet by mouth daily. 30 tablet 5  . metFORMIN (GLUCOPHAGE) 1000 MG tablet Take 1 tablet (1,000 mg total) by mouth 2 (two) times daily with a meal. 60 tablet 5  . metoprolol succinate (TOPROL-XL) 100 MG 24 hr tablet Take 1 tablet (100 mg total) by mouth daily. Take with or immediately following a meal. 30 tablet 5  . ranitidine (ZANTAC) 150 MG tablet Take 1 tablet (150 mg total) by mouth 2 (two) times daily as needed for heartburn. 60 tablet 5   No current facility-administered medications on file prior to visit.     Allergies  Allergen Reactions  . Januvia [Sitagliptin] Nausea And Vomiting    pancreatitis     Objective:  BP (!) 158/76   Pulse 83   Temp 98.3 F (36.8 C) (Oral)   Resp 17   Ht 5' 7.5" (1.715 m)   Wt 219 lb (99.3 kg)   SpO2 97%   BMI 33.79 kg/m   Physical Exam  Constitutional: He is oriented to person, place, and time and well-developed, well-nourished, and in no distress.  HENT:  Head: Normocephalic and atraumatic.  Eyes: Conjunctivae are normal.  Neck:  Normal range of motion.  Pulmonary/Chest: Effort normal.  Neurological: He is alert and oriented to person, place, and time. Gait normal.  Skin: Skin is warm and dry.  Bilateral hallux toenail with severely thickened yellow angulated appearance.  Psychiatric: Affect normal.  Vitals reviewed.   Assessment and Plan :  1. Onychomycosis Due to the severity of patient's onychomycosis and hx of diabetes, I believe he would benefit from podiatry referral.  - Ambulatory referral to [facility] PA-C  [facility] 01/12/2017 12:05 PM

## 2017-02-08 DIAGNOSIS — K080 Exfoliation of teeth due to systemic causes: Secondary | ICD-10-CM | POA: Diagnosis not present

## 2017-02-13 ENCOUNTER — Ambulatory Visit (INDEPENDENT_AMBULATORY_CARE_PROVIDER_SITE_OTHER): Payer: Federal, State, Local not specified - PPO | Admitting: Podiatry

## 2017-02-13 DIAGNOSIS — E1159 Type 2 diabetes mellitus with other circulatory complications: Secondary | ICD-10-CM

## 2017-02-13 DIAGNOSIS — B351 Tinea unguium: Secondary | ICD-10-CM

## 2017-02-13 DIAGNOSIS — M79676 Pain in unspecified toe(s): Secondary | ICD-10-CM

## 2017-02-13 NOTE — Progress Notes (Signed)
Complaint:  Visit Type: Patient returns to my office for continued preventative foot care services. Complaint: Patient states" my nails have grown long and thick and become painful to walk and wear shoes" Patient has been diagnosed with DM with no foot complications. The patient presents for preventative foot care services. No changes to ROS  Podiatric Exam: Vascular: dorsalis pedis and posterior tibial pulses are not  palpable bilateral. Capillary return is immediate. Cold feet.. Skin turgor WNL  Sensorium: Diminished  Semmes Weinstein monofilament test. Normal tactile sensation bilaterally. Nail Exam: Pt has thick disfigured discolored nails with subungual debris noted bilateral entire nail hallux through fifth toenails Ulcer Exam: There is no evidence of ulcer or pre-ulcerative changes or infection. Orthopedic Exam: Muscle tone and strength are WNL. No limitations in general ROM. No crepitus or effusions noted. Foot type and digits show no abnormalities. Bony prominences are unremarkable. Skin: No Porokeratosis. No infection or ulcers  Diagnosis:  Onychomycosis, , Pain in right toe, pain in left toes Diabetes with angiopathy  Treatment & Plan Procedures and Treatment: Consent by patient was obtained for treatment procedures. The patient understood the discussion of treatment and procedures well. All questions were answered thoroughly reviewed. Debridement of mycotic and hypertrophic toenails, 1 through 5 bilateral and clearing of subungual debris. No ulceration, no infection noted.  Return Visit-Office Procedure: Patient instructed to return to the office for a follow up visit 3 months for continued evaluation and treatment.    Randy Ballard DPM 

## 2017-03-01 DIAGNOSIS — K080 Exfoliation of teeth due to systemic causes: Secondary | ICD-10-CM | POA: Diagnosis not present

## 2017-03-27 NOTE — Progress Notes (Signed)
This encounter was created in error - please disregard.

## 2017-04-26 DIAGNOSIS — K080 Exfoliation of teeth due to systemic causes: Secondary | ICD-10-CM | POA: Diagnosis not present

## 2017-05-14 ENCOUNTER — Encounter: Payer: Self-pay | Admitting: Family Medicine

## 2017-05-14 DIAGNOSIS — E113293 Type 2 diabetes mellitus with mild nonproliferative diabetic retinopathy without macular edema, bilateral: Secondary | ICD-10-CM | POA: Diagnosis not present

## 2017-05-14 LAB — HM DIABETES EYE EXAM

## 2017-05-16 ENCOUNTER — Ambulatory Visit (INDEPENDENT_AMBULATORY_CARE_PROVIDER_SITE_OTHER): Payer: Federal, State, Local not specified - PPO | Admitting: Podiatry

## 2017-05-16 ENCOUNTER — Encounter: Payer: Self-pay | Admitting: Podiatry

## 2017-05-16 DIAGNOSIS — B351 Tinea unguium: Secondary | ICD-10-CM | POA: Diagnosis not present

## 2017-05-16 DIAGNOSIS — M79676 Pain in unspecified toe(s): Secondary | ICD-10-CM | POA: Diagnosis not present

## 2017-05-16 NOTE — Progress Notes (Signed)
Complaint:  Visit Type: Patient returns to my office for continued preventative foot care services. Complaint: Patient states" my nails have grown long and thick and become painful to walk and wear shoes" Patient has been diagnosed with DM with no foot complications. The patient presents for preventative foot care services. No changes to ROS  Podiatric Exam: Vascular: dorsalis pedis and posterior tibial pulses are not  palpable bilateral. Capillary return is immediate. Cold feet.. Skin turgor WNL  Sensorium: Diminished  Semmes Weinstein monofilament test. Normal tactile sensation bilaterally. Nail Exam: Pt has thick disfigured discolored nails with subungual debris noted bilateral entire nail hallux through fifth toenails Ulcer Exam: There is no evidence of ulcer or pre-ulcerative changes or infection. Orthopedic Exam: Muscle tone and strength are WNL. No limitations in general ROM. No crepitus or effusions noted. Foot type and digits show no abnormalities. Bony prominences are unremarkable. Skin: No Porokeratosis. No infection or ulcers  Diagnosis:  Onychomycosis, , Pain in right toe, pain in left toes Diabetes with angiopathy  Treatment & Plan Procedures and Treatment: Consent by patient was obtained for treatment procedures. The patient understood the discussion of treatment and procedures well. All questions were answered thoroughly reviewed. Debridement of mycotic and hypertrophic toenails, 1 through 5 bilateral and clearing of subungual debris. No ulceration, no infection noted.  Return Visit-Office Procedure: Patient instructed to return to the office for a follow up visit 3 months for continued evaluation and treatment.    Terrill Alperin DPM 

## 2017-05-23 DIAGNOSIS — K080 Exfoliation of teeth due to systemic causes: Secondary | ICD-10-CM | POA: Diagnosis not present

## 2017-06-12 NOTE — Progress Notes (Deleted)
Subjective:    Patient ID: Randy Ballard, male    DOB: May 24, 1961, 56 y.o.   MRN: [id number]  HPI  Randy Ballard is a 56 y.o. male who presents to [facility] presents for 6 month follow up on chronic medical conditions.   Chronic Medical Conditions:  DMII:  He has not been checking his blood sugar. We did trial of increasing glimepiride 4 mg -> 8 mg daily but caused hypoglycemic episodes and no improvement in A1c so reduced back down to 4 mg. On metformin 1 g twice a day. At last visit, pt opted to work on lowering blood sugars by increasing exercise/activity. Pt did have a mild episode of pancreatitis in 2014 which was poss due to [place] so endocrine rec avoiding GLP agonist or DPP-IV inh (though ok to try SGLT2 antagonist).   He continues to try to eat a low fat diet and a plant based diet with proteins high in omega-3s.   Microalb slightly elev 05/2016- on acei lisinopril.  eGFR 98-114  [place]a? Sees podiatry - Dr. Prudence Davidson at [facility].  Decreased monofilament test but that is also due to the SCA3 Sched optho visit??? - vision works so long overdue (last documented 05/2014).   mild diabetic retinopathy diagnosed 4 years prior Exercise difficult due to muscular dystrophy but still tries to walk using shopping cart, gardens.   Lab Results  Component Value Date   HGBA1C 7.6 11/30/2016   HGBA1C 7.5 05/25/2016   HGBA1C 7.6 11/25/2015    HTN: On lisinopril-hctz 20-25 and Toprol 124m qd. Monitors blood pressure at home - 130-135/75.  HLD:  No h/o cardiac or vascular disease so goal LDL <100. At goal on lipitor 454m Lipid panel has been at goal for sev years but 6 mos ago LDL increased sig. Non-HD 116.   ? Peripheral vascular disease as podiatry reported pedal pulses not palp which would change goal LDL <70. . .  Lab Results  Component Value Date   LDLCALC 88 11/30/2016   Tobacco abuse: 1 ppd x 32 yrs, trying to quit, his wife smokes as well. Used nicotine patches to cut down last  yr and decreased to 1/2 ppd - occ less.  He went to a smoking cessation program in [place]. Referred to screening lung CT program at last visit but patient never returned their call to schedule. No prior chest imaging in chart at all.  Hypothyroid ([place] s/p RAIU 2010): Has been stable on levothyroxine 125 g for over 3 years though last tsh 6 mos prior was very mildly elev.  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 PeLeta Baptistnnually- needs to keep active with periodic PT. If he develops spasms, try baclofen and try levodopa for tremors and stiffness. 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  Lab Results  Component Value Date   VD25OH 25 (L) 01/29/2015   Anemia: unknown etiology - prior w/u was nml with nml B12, folate, iron. Resolved with last labs. CBC Latest Ref Rng & Units 11/30/2016 05/25/2016 06/10/2015  WBC 3.4 - 10.8 x10E3/uL 10.3 10.1 9.6  Hemoglobin 13.0 - 17.7 g/dL 13.6 13.7 12.7(L)  Hematocrit 37.5 - 51.0 % 40.7 40.6 37.5(L)  Platelets 150 - 379 x10E3/uL 307 305 301   Chronic back pain: lumbar DDD - tried on prn etodolac as eGFR 98-114  Chronic constipation: senokot S prn keeps things well  controlled.  GERD: prn zantac  Loves to garden and plays guitar.  He has a recombinant bike and treadmill at home for exercise.   Review of Systems     Objective:   Physical Exam        Assessment & Plan:  Tsh, a1c, microalb, lipid, cmp, vit D FOOT EXAM -- Flu shot 6 mos for CPE. tsh - might need to incrase levothyroxine dose so wait till labs return to refill. Sched screening lung CT. ?refer to optho

## 2017-06-14 ENCOUNTER — Ambulatory Visit: Payer: Self-pay | Admitting: Family Medicine

## 2017-06-20 ENCOUNTER — Encounter: Payer: Self-pay | Admitting: Family Medicine

## 2017-06-20 ENCOUNTER — Ambulatory Visit (INDEPENDENT_AMBULATORY_CARE_PROVIDER_SITE_OTHER): Payer: Federal, State, Local not specified - PPO | Admitting: Family Medicine

## 2017-06-20 VITALS — BP 187/76 | HR 90 | Temp 98.0°F | Resp 18 | Ht 67.5 in | Wt 221.0 lb

## 2017-06-20 DIAGNOSIS — E559 Vitamin D deficiency, unspecified: Secondary | ICD-10-CM

## 2017-06-20 DIAGNOSIS — G118 Other hereditary ataxias: Secondary | ICD-10-CM

## 2017-06-20 DIAGNOSIS — I10 Essential (primary) hypertension: Secondary | ICD-10-CM

## 2017-06-20 DIAGNOSIS — E785 Hyperlipidemia, unspecified: Secondary | ICD-10-CM | POA: Diagnosis not present

## 2017-06-20 DIAGNOSIS — Z23 Encounter for immunization: Secondary | ICD-10-CM | POA: Diagnosis not present

## 2017-06-20 DIAGNOSIS — Z6834 Body mass index (BMI) 34.0-34.9, adult: Secondary | ICD-10-CM

## 2017-06-20 DIAGNOSIS — E113299 Type 2 diabetes mellitus with mild nonproliferative diabetic retinopathy without macular edema, unspecified eye: Secondary | ICD-10-CM

## 2017-06-20 DIAGNOSIS — E890 Postprocedural hypothyroidism: Secondary | ICD-10-CM | POA: Diagnosis not present

## 2017-06-20 DIAGNOSIS — Z720 Tobacco use: Secondary | ICD-10-CM

## 2017-06-20 LAB — POCT GLYCOSYLATED HEMOGLOBIN (HGB A1C): HEMOGLOBIN A1C: 7.7

## 2017-06-20 MED ORDER — DAPAGLIFLOZIN PRO-METFORMIN ER 2.5-1000 MG PO TB24: 2.0000 | ORAL_TABLET | Freq: Every day | ORAL | 5 refills | Status: DC

## 2017-06-20 NOTE — Progress Notes (Deleted)
Subjective:    Patient ID: Randy Ballard, male    DOB: 10-27-1960, 56 y.o.   MRN: [id number] Chief Complaint  Patient presents with  . Medication Refill    all except levothyroxine     HPI  Randy Ballard is a 56 y.o. male who presents to [facility] presents for 6 month follow up on chronic medical conditions.   Chronic Medical Conditions:  DMII:  He has not been checking his blood sugar. We did trial of increasing glimepiride 4 mg -> 8 mg daily but caused hypoglycemic episodes and no improvement in A1c so reduced back down to 4 mg. On metformin 1 g twice a day. At last visit, pt opted to work on lowering blood sugars by increasing exercise/activity. Pt did have a mild episode of pancreatitis in 2014 which was poss due to [place] so endocrine rec avoiding GLP agonist or DPP-IV inh (though ok to try SGLT2 antagonist).   He continues to try to eat a low fat diet and a plant based diet with proteins high in omega-3s.   Microalb slightly elev 05/2016- on acei lisinopril.  eGFR 98-114  [facility] P[facility] podiatry - Dr. Prudence Davidson at [facility].  Decreased monofilament test but that is also due to the SCA3 Sched optho visit??? - vision works so long overdue (last documented 05/2014).   mild diabetic retinopathy diagnosed 4 years prior Exercise difficult due to muscular dystrophy but still tries to walk using shopping cart, gardens.   Lab Results  Component Value Date   HGBA1C 7.6 11/30/2016   HGBA1C 7.5 05/25/2016   HGBA1C 7.6 11/25/2015    HTN: On lisinopril-hctz 20-25 and Toprol 1103m qd. Monitors blood pressure at home - 130-135/75.  HLD:  No h/o cardiac or vascular disease so goal LDL <100. At goal on lipitor 430m Lipid panel has been at goal for sev years but 6 mos ago LDL increased sig. Non-HD 116.   ? Peripheral vascular disease as podiatry reported pedal pulses not palp which would change goal LDL <70. . .  Lab Results  Component Value Date   LDLCALC 88 11/30/2016   Tobacco abuse:  1 ppd x 32 yrs, trying to quit, his wife smokes as well. Used nicotine patches to cut down last yr and decreased to 1/2 ppd - occ less.  He went to a smoking cessation program in [place]. Referred to screening lung CT program at last visit but patient never returned their call to schedule. No prior chest imaging in chart at all.  Hypothyroid ([place] s/p RAIU 2010): Has been stable on levothyroxine 125 g for over 3 years though last tsh 6 mos prior was very mildly elev.  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 PeLeta Baptistnnually- needs to keep active with periodic PT. If he develops spasms, try baclofen and try levodopa for tremors and stiffness. 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  Lab Results  Component Value Date   VD25OH 25 (L) 01/29/2015   Anemia: unknown etiology - prior w/u was nml with nml B12, folate, iron. Resolved with last labs. CBC Latest Ref Rng & Units 11/30/2016 05/25/2016 06/10/2015  WBC 3.4 - 10.8 x10E3/uL 10.3 10.1 9.6  Hemoglobin 13.0 - 17.7 g/dL 13.6 13.7 12.7(L)  Hematocrit 37.5 - 51.0 % 40.7 40.6 37.5(L)  Platelets 150 - 379 x10E3/uL 307 305 301   Chronic back pain:  lumbar DDD - tried on prn etodolac as eGFR 98-114  Chronic constipation: senokot S prn keeps things well controlled.  GERD: prn zantac  Loves to garden and plays guitar.  He has a recombinant bike and treadmill at home for exercise.   Review of Systems     Objective:   Physical Exam        Assessment & Plan:  Tsh, a1c, microalb, lipid, cmp, vit D FOOT EXAM -- Flu shot 6 mos for CPE. tsh - might need to incrase levothyroxine dose so wait till labs return to refill. Sched screening lung CT. ?refer to optho  ASK SCREENING CT PULM OT CALL PT

## 2017-06-20 NOTE — Patient Instructions (Addendum)
[place] is now offering annual lung cancer screening by low-dose CT scan.  This is covered for qualifying patients and your insurance will be checked before the procedure.  Call the lung cancer screening nurse navigators at [phone] to learn more about this and get scheduled.     IF you received an x-ray today, you will receive an invoice from [facility]. Please contact [facility] at [phone] with questions or concerns regarding your invoice.   IF you received labwork today, you will receive an invoice from [facility]. Please contact [facility] at [phone] with questions or concerns regarding your invoice.   Our billing staff will not be able to assist you with questions regarding bills from these companies.  You will be contacted with the lab results as soon as they are available. The fastest way to get your results is to activate your My Chart account. Instructions are located on the last page of this paperwork. If you have not heard from [place] regarding the results in 2 weeks, please contact this office.     Lung Cancer Screening A lung cancer screening is a test that checks for lung cancer. Lung cancer screening is done to look for lung cancer in its very early stages, before it spreads and becomes harder to treat and before symptoms appear. Finding cancer early improves the chances of successful treatment. It may save your life. Should I be screened for lung cancer? You should be screened for lung cancer if all of these apply:  You currently smoke or you have quit smoking within the past 15 years.  You are 32-43 years old. Screening may be recommended up to age 24 depending on your overall health and other factors.  You are in good general health.  You have a 30-pack-year smoking history.  To find your pack-year history, multiply how many packages of cigarettes you smoked each day by the number of years you smoked. For example, if you smoked two packs of  cigarettes each day for 15 years, your pack-year history is 30. If you are not sure what your pack-year smoking history is, ask your health care provider. Screening may also be recommended if you are at high risk for the disease. You may be at high risk if:  You have a family history of lung cancer.  You have been exposed to asbestos.  You have chronic obstructive pulmonary disease (COPD).  You have a history of previous lung cancer.  How often should I be screened for lung cancer? If you are at risk for lung cancer, it is recommended that you are screened once a year. The recommended screening test is a low-dose CT scan. How can I lower my risk of lung cancer? To lower your risk of developing lung cancer:  If you smoke, stop smoking all tobacco products.  Avoid secondhand smoke.  Avoid exposure to radiation.  Avoid exposure to radon gas. Have your home checked for radon regularly.  Avoid things that cause cancer (carcinogens).  Avoid living or working in places with high air pollution.  Where to find more information: Ask your health care provider about the risks and benefits of screening. More information and resources are available from these organizations:  American Cancer Society (ACS): www.cancer.org  American Lung Association: www.lung.org  Contact a health care provider if:  You start to show symptoms of lung cancer, including: ? Coughing that will not go away. ? Wheezing. ? Chest pain. ? Coughing up blood. ? Shortness of breath. ?  Weight loss that cannot be explained. ? Constant fatigue. Summary  Lung cancer screening may find lung cancer before symptoms appear. Finding cancer early improves the chances of successful treatment. It may save your life.  If you are at risk for lung cancer, it is recommended that you are screened once a year. The recommended screening test is a low-dose CT scan.  You can make lifestyle changes to lower your risk of lung  cancer.  Ask your health care provider about the risks and benefits of screening. This information is not intended to replace advice given to you by your health care provider. Make sure you discuss any questions you have with your health care provider. Document Released: 08/16/2016 Document Revised: 08/16/2016 Document Reviewed: 08/16/2016 Elsevier Interactive Patient Education  [employer]2018 [employer]

## 2017-06-20 NOTE — Progress Notes (Signed)
Subjective:  By signing my name below, I, Randy Ballard, attest that this documentation has been prepared under the direction and in the presence of [name], MD Electronically Signed: Ladene Artist, ED Scribe 06/20/2017 at 9:26 AM.   Patient ID: Randy Ballard, male    DOB: 1960/12/28, 56 y.o.   MRN: [id number]  Chief Complaint  Patient presents with  . Medication Refill    all except levothyroxine    HPI  Randy Ballard is a 56 y.o. male who presents to [facility] presents for 6 month follow up on chronic medical conditions.   Chronic Medical Conditions:  DMII: He has not been checking his blood sugar. We did trial of increasing glimepiride 4 mg -> 8 mg daily but caused hypoglycemic episodes and no improvement in A1c so reduced back down to 4 mg. On metformin 1 g twice a day. At last visit, pt opted to work on lowering blood sugars by increasing exercise/activity. Pt does not regularly check his blood glucose but reports checking it after lunch with a reading of 233. Denies feeling his blood glucose dropping too low. He has been taking Metformin in the mornings and rarely forgets to take his second dose but reports taking the second dose around 10 PM when he remembers. States he usually takes his morning medications with water or coffee since he does not eat until ~1 PM due to lack of appetite.  Pt did have a mild episode of pancreatitis in 2014 which was poss due to [place] so endocrine rec avoiding GLP agonist or DPP-IV inh (though ok to try SGLT2 antagonist).   He continues to try to eat a low fat diet and a plant based diet with proteins high in omega-3s.   Microalb slightly elev 05/2016- on acei lisinopril.  eGFR 98-114  Asa? Sees podiatry - Dr. Prudence Davidson at [facility].  Decreased monofilament test but that is also due to the SCA3 Sched optho visit was 05/20/17- vision works so long overdue (last documented 05/2014).  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   Lab Results  Component Value Date   HGBA1C 7.6 11/30/2016   HGBA1C 7.5 05/25/2016   HGBA1C 7.6 11/25/2015    HTN: On lisinopril-hctz 20-25 and Toprol 176m qd. Monitors blood pressure at home - 130-135/73.  HLD: No h/o cardiac or vascular disease so goal LDL <100. At goal on lipitor 434m Lipid panel has been at goal for sev years but 6 mos ago LDL increased sig. Non-HD 116.   ? Peripheral vascular disease as podiatry reported pedal pulses not palp which would change goal LDL <70. . .  Lab Results  Component Value Date   LDLCALC 88 11/30/2016   Tobacco abuse: 1 ppd x 32 yrs, trying to quit, his wife smokes as well. Used nicotine patches to cut down last year and decreased to 1/2 ppd - occ less. Pt has stopped smoking in his vehicle. He went to a smoking cessation program in [place]. Referred to screening lung CT program at last visit but patient never returned their call to schedule. No prior chest imaging in chart at all.   Hypothyroid ([place]ves s/p RAIU 2010): Has been stable on levothyroxine 125 g for over 3 years though last tsh 6 mos prior was very mildly elev.  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 PeLeta Baptistnnually with the last visit being 10/2015- needs to keep  active with periodic PT. If he develops spasms, try baclofen and try levodopa for tremors and stiffness. He is getting more dependent on the rolling walker so was going to work on Forensic scientist. Has been walking for exercise. Does report a fall that occurred ~3 weeks ago in the bathtub in which he struck his last right ribs on the tub and cut the right side of his head on the plastic rods in the shower.   Vitamin D def: Restarted supp after visit 6 mos prior. Taking 5000 units/daily. Lab Results  Component Value Date   VD25OH 25 (L) 01/29/2015   Anemia: unknown etiology - prior w/u was nml with nml B12, folate, iron. Resolved with last  labs. CBC Latest Ref Rng & Units 11/30/2016 05/25/2016 06/10/2015  WBC 3.4 - 10.8 x10E3/uL 10.3 10.1 9.6  Hemoglobin 13.0 - 17.7 g/dL 13.6 13.7 12.7(L)  Hematocrit 37.5 - 51.0 % 40.7 40.6 37.5(L)  Platelets 150 - 379 x10E3/uL 307 305 301   Chronic back pain: lumbar DDD - tried on prn etodolac as eGFR 98-114  Chronic constipation: senokot S prn keeps things well controlled. Has not need to use Senokot for constipation.   GERD: prn zantac. States GERD has been controlled.   Loves to garden and plays guitar.  He has a recombinant bike and treadmill at home for exercise.   Past Medical History:  Diagnosis Date  . Diabetes mellitus without complication ([place])   . Diverticulitis   . Hyperlipidemia   . Hypertension   . Hypogonadism male   . Neuromuscular disorder ([place])   . 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 Thyroid disease 02/06/2009   [place] s/p RAIU; cold nodule s/p negative biopsy   Current Outpatient Prescriptions on File Prior to Visit  Medication Sig Dispense Refill  . aspirin 81 MG tablet Take 1 tablet (81 mg total) by mouth daily. 30 tablet 11  . atorvastatin (LIPITOR) 40 MG tablet Take 1 tablet (40 mg total) by mouth daily. 30 tablet 5  . Blood Glucose Monitoring Suppl (BLOOD GLUCOSE METER) kit Test blood sugar daily 1 each 0  . calcium-vitamin D (OSCAL WITH D) 500-200 MG-UNIT per tablet Take 1 tablet by mouth 2 (two) times daily.    Marland Kitchen etodolac (LODINE) 200 MG capsule Take 1-2 capsules (200-400 mg total) by mouth 2 (two) times daily as needed (back pain). 60 capsule 1  . glimepiride (AMARYL) 4 MG tablet Take 1 tablet (4 mg total) by mouth daily with breakfast. 30 tablet 5  . glucose blood test strip Test blood sugar daily. 100 each 3  . Lancet Devices (LANCING DEVICE) MISC Test blood sugar daily. 1 each 0  . levothyroxine (SYNTHROID, LEVOTHROID) 125 MCG tablet Take 1 tablet (125 mcg total) by mouth daily before breakfast. 90 tablet 3  . lisinopril-hydrochlorothiazide  (PRINZIDE,ZESTORETIC) 20-25 MG tablet Take 1 tablet by mouth daily. 30 tablet 5  . metFORMIN (GLUCOPHAGE) 1000 MG tablet Take 1 tablet (1,000 mg total) by mouth 2 (two) times daily with a meal. 60 tablet 5  . metoprolol succinate (TOPROL-XL) 100 MG 24 hr tablet Take 1 tablet (100 mg total) by mouth daily. Take with or immediately following a meal. 30 tablet 5  . ranitidine (ZANTAC) 150 MG tablet Take 1 tablet (150 mg total) by mouth 2 (two) times daily as needed for heartburn. 60 tablet 5   No current facility-administered medications on file prior to visit.    Allergies  Allergen Reactions  . Januvia [  Sitagliptin] Nausea And Vomiting    pancreatitis   Past Surgical History:  Procedure Laterality Date  . BIOPSY THYROID  02/06/2009   cold thyroid nodule; negative/benign.  . COLONOSCOPY W/ POLYPECTOMY  Feb 2014   5 polyps  . TOOTH EXTRACTION  10/2015   x 4 teeth   Family History  Problem Relation Age of Onset  . Adopted: Yes  . Ataxia Mother 57       SCA3  . Ataxia Maternal Grandmother        SCA3  . Ataxia Maternal Aunt        SCA3   Social History   Social History  . Marital status: Married    Spouse name: Helene Kelp  . Number of children: 0  . Years of education: HS   Occupational History  . service rep   . retired Musician   Social History Main Topics  . Smoking status: Current Some Day Smoker    Packs/day: 0.50    Years: 32.00    Types: Cigarettes  . Smokeless tobacco: Never Used     Comment: 10/26/15 trying to quit  . Alcohol use No  . Drug use: No  . Sexual activity: Yes     Comment: 1 partner in last 12 months   Other Topics Concern  . None   Social History Narrative   Marital status:  Married x 20 years; happily.      Children: none      Lives: with wife.      Employment: delivers oxygen; works for [employer]; drives locally      Tobacco: trying to quit 10/2012.      Alcohol: None      Drugs: none      Exercise: treadmill sporadic.   Depression screen [facility]  2/9 06/20/2017 01/12/2017 01/08/2017 11/30/2016 05/25/2016  Decreased Interest 0 0 0 0 0  Down, Depressed, Hopeless 0 0 0 0 0  PHQ - 2 Score 0 0 0 0 0    Review of Systems  Gastrointestinal: Negative for constipation.      Objective:   Physical Exam  Constitutional: He is oriented to person, place, and time. He appears well-developed and well-nourished. No distress.  HENT:  Head: Normocephalic and atraumatic.  Right Ear: Tympanic membrane normal.  Left Ear: Tympanic membrane normal.  Nose: Nose normal.  Eyes: Conjunctivae and EOM are normal.  Neck: Neck supple. No tracheal deviation present.  Cardiovascular: Normal rate, regular rhythm, S1 normal, S2 normal and normal heart sounds.   Pulmonary/Chest: Effort normal and breath sounds normal. No respiratory distress.  Musculoskeletal: Normal range of motion.  Neurological: He is alert and oriented to person, place, and time.  Skin: Skin is warm and dry.  Psychiatric: He has a normal mood and affect. His behavior is normal.  Nursing note and vitals reviewed.  BP (!) 187/76   Pulse 90   Temp 98 F (36.7 C) (Oral)   Resp 18   Ht 5' 7.5" (1.715 m)   Wt 221 lb (100.2 kg)   SpO2 100%   BMI 34.10 kg/m     Results for orders placed or performed in visit on 06/20/17  POCT glycosylated hemoglobin (Hb A1C)  Result Value Ref Range   Hemoglobin A1C 7.7    Assessment & Plan:  Tsh, a1c, microalb, lipid, cmp, vit D FOOT EXAM -- Flu shot 6 mos for CPE. tsh - might need to incrase levothyroxine dose so wait till labs return to refill. Sched screening lung  CT. ?refer to optho  1. 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 Start sglt2.  2. Essential hypertension   3. Postablative hypothyroidism   4. Dyslipidemia   5. BMI 34.0-34.9,adult   6. Tobacco abuse   7. SCA-3 (spinocerebellar ataxia  type 3) ([place])   8. Need for influenza vaccination   9. Vitamin D deficiency     Orders Placed This Encounter  Procedures  . Flu Vaccine QUAD 36+ mos IM  . TSH  . Lipid panel    Order Specific Question:   Has the patient fasted?    Answer:   Yes  . Comprehensive metabolic panel    Order Specific Question:   Has the patient fasted?    Answer:   Yes  . VITAMIN D 25 Hydroxy (Vit-D Deficiency, Fractures)  . Microalbumin/Creatinine Ratio, Urine  . POCT glycosylated hemoglobin (Hb A1C)  . HM DIABETES FOOT EXAM    Meds ordered this encounter  Medications  . Dapagliflozin-Metformin HCl ER (XIGDUO XR) 2.02-999 MG TB24    Sig: Take 2 tablets by mouth daily.    Dispense:  60 tablet    Refill:  5    I personally performed the services described in this documentation, which was scribed in my presence. The recorded information has been reviewed and considered, and addended by me as needed.   [name], M.D.  Primary Care at [facility] [address] [place], [place] [address] [phone] phone [phone] fax  06/22/17 12:52 AM

## 2017-06-21 ENCOUNTER — Telehealth: Payer: Self-pay

## 2017-06-21 ENCOUNTER — Telehealth: Payer: Self-pay | Admitting: Family Medicine

## 2017-06-21 DIAGNOSIS — I10 Essential (primary) hypertension: Secondary | ICD-10-CM

## 2017-06-21 DIAGNOSIS — E1142 Type 2 diabetes mellitus with diabetic polyneuropathy: Secondary | ICD-10-CM

## 2017-06-21 DIAGNOSIS — E785 Hyperlipidemia, unspecified: Secondary | ICD-10-CM

## 2017-06-21 LAB — MICROALBUMIN / CREATININE URINE RATIO
CREATININE, UR: 252.6 mg/dL
MICROALB/CREAT RATIO: 91.7 mg/g{creat} — AB (ref 0.0–30.0)
Microalbumin, Urine: 231.7 ug/mL

## 2017-06-21 LAB — COMPREHENSIVE METABOLIC PANEL
A/G RATIO: 1.7 (ref 1.2–2.2)
ALBUMIN: 4.7 g/dL (ref 3.5–5.5)
ALT: 49 IU/L — AB (ref 0–44)
AST: 40 IU/L (ref 0–40)
Alkaline Phosphatase: 79 IU/L (ref 39–117)
BUN/Creatinine Ratio: 10 (ref 9–20)
BUN: 9 mg/dL (ref 6–24)
Bilirubin Total: 0.4 mg/dL (ref 0.0–1.2)
CALCIUM: 9.2 mg/dL (ref 8.7–10.2)
CO2: 22 mmol/L (ref 20–29)
Chloride: 99 mmol/L (ref 96–106)
Creatinine, Ser: 0.91 mg/dL (ref 0.76–1.27)
GFR, EST AFRICAN AMERICAN: 108 mL/min/{1.73_m2} (ref >59)
GFR, EST NON AFRICAN AMERICAN: 93 mL/min/{1.73_m2} (ref >59)
GLOBULIN, TOTAL: 2.7 g/dL (ref 1.5–4.5)
Glucose: 167 mg/dL — ABNORMAL HIGH (ref 65–99)
POTASSIUM: 4.3 mmol/L (ref 3.5–5.2)
Sodium: 140 mmol/L (ref 134–144)
Total Protein: 7.4 g/dL (ref 6.0–8.5)

## 2017-06-21 LAB — LIPID PANEL
CHOL/HDL RATIO: 3.5 ratio (ref 0.0–5.0)
CHOLESTEROL TOTAL: 115 mg/dL (ref 100–199)
HDL: 33 mg/dL — ABNORMAL LOW (ref >39)
LDL Calculated: 62 mg/dL (ref 0–99)
TRIGLYCERIDES: 101 mg/dL (ref 0–149)
VLDL Cholesterol Cal: 20 mg/dL (ref 5–40)

## 2017-06-21 LAB — TSH: TSH: 2.25 u[IU]/mL (ref 0.450–4.500)

## 2017-06-21 LAB — VITAMIN D 25 HYDROXY (VIT D DEFICIENCY, FRACTURES): Vit D, 25-Hydroxy: 47.4 ng/mL (ref 30.0–100.0)

## 2017-06-21 MED ORDER — LISINOPRIL-HYDROCHLOROTHIAZIDE 20-25 MG PO TABS: 1.0000 | ORAL_TABLET | Freq: Every day | ORAL | 1 refills | Status: DC

## 2017-06-21 MED ORDER — METOPROLOL SUCCINATE ER 100 MG PO TB24: 100.0000 mg | ORAL_TABLET | Freq: Every day | ORAL | 1 refills | Status: DC

## 2017-06-21 MED ORDER — ATORVASTATIN CALCIUM 40 MG PO TABS: 40.0000 mg | ORAL_TABLET | Freq: Every day | ORAL | 1 refills | Status: DC

## 2017-06-21 MED ORDER — GLIMEPIRIDE 4 MG PO TABS: 4.0000 mg | ORAL_TABLET | Freq: Every day | ORAL | 1 refills | Status: DC

## 2017-06-21 NOTE — Telephone Encounter (Signed)
Spoke with patient, states that he will start new Metformin, all other medications sent to pharmacy./ S.Nonna Renninger,CMA

## 2017-06-21 NOTE — Telephone Encounter (Signed)
Patient called office stating [facility] Per provider, ok to send 90 days with 1 refill of all medications except metformin. Medication sent at this time./ S.Jerrad Mendibles,CMA

## 2017-06-21 NOTE — Telephone Encounter (Signed)
PATIENT STATES HE SAW DR. SHAW YESTERDAY (06/20/17) AND WHEN HE GOT TO THE PHARMACY NONE OF HIS MEDICINE HAD BEEN SENT IN EXCEPT THE NEW METFORMIN SHE WANTS HIM TO TRY. HE SAID HE WILL NOT START IT UNTIL HE SEES DR. SHAW AGAIN BECAUSE HE HAS TOO MANY SIDE EFFECTS WHEN HE STARTS A NEW MEDICINE. HE NEEDS REFILLS ON HIS METOPROLOL SUCCINATE 100 MG, LISINOPRIL-HYDROCHLOROTHIAZIDE 20-25 MG, GLIMEPIRIDE 4 MG, ATORVASTATIN 40 MG AND METFORMIN 1000 MG. HE NEEDS TO PICK HIS MEDICINE UP TODAY BEFORE THE HURRICANE. BEST PHONE [phone] (CELL) PHARMACY CHOICE IS [facility] ON [address]. MBC

## 2017-06-25 ENCOUNTER — Telehealth: Payer: Self-pay | Admitting: Family Medicine

## 2017-06-25 DIAGNOSIS — E1142 Type 2 diabetes mellitus with diabetic polyneuropathy: Secondary | ICD-10-CM

## 2017-06-25 NOTE — Telephone Encounter (Signed)
Clelia Croft - Pt was prescribed a new medication, dapagliflozin-metformin last week and his insurance will not cover it.  He only has a few days left of his regular metformin.  Please call asap.  [phone]

## 2017-06-25 NOTE — Telephone Encounter (Signed)
Please advise. Would like me to refill his regular metformin?

## 2017-06-26 ENCOUNTER — Telehealth: Payer: Self-pay

## 2017-06-26 MED ORDER — METFORMIN HCL 1000 MG PO TABS: 1000.0000 mg | ORAL_TABLET | Freq: Two times a day (BID) | ORAL | 5 refills | Status: DC

## 2017-06-26 NOTE — Telephone Encounter (Signed)
Patient was informed.

## 2017-06-26 NOTE — Telephone Encounter (Signed)
Patient called and inquired about his metformin.  I told him that I was trying to get the [facility] approved for him at Dr. Alver Fisher request.  I told him it would take about 24 hrs to find out if it was approved or denied, and that I will call him in the morning with the outcome.  He was very gracious and said he would await the call.

## 2017-06-26 NOTE — Telephone Encounter (Signed)
Received request from pharmacy for xigduo.  Submitted request online at Cover My Meds.  Key Code is [id number].  Follow up within 24-72 hrs.  If not back in 72 hrs call plan at [phone].

## 2017-06-26 NOTE — Telephone Encounter (Signed)
Please remind pt to d/c the regular metformin when he gets the new medicine.

## 2017-06-27 NOTE — Telephone Encounter (Signed)
Received fax stating that Randy Ballard was denied because of eGFR.  I took form to Dr. Brigitte Pulse for her advice. She pulled up his chart and saw that his eGFR is infact within the approved levels.  I called insurance company back and started a new PA for the medication.  It was approved through 06/27/2018. Patient and Dr. Brigitte Pulse aware.

## 2017-07-24 DIAGNOSIS — K080 Exfoliation of teeth due to systemic causes: Secondary | ICD-10-CM | POA: Diagnosis not present

## 2017-08-17 ENCOUNTER — Ambulatory Visit: Payer: Federal, State, Local not specified - PPO | Admitting: Podiatry

## 2017-08-17 ENCOUNTER — Encounter: Payer: Self-pay | Admitting: Podiatry

## 2017-08-17 DIAGNOSIS — B351 Tinea unguium: Secondary | ICD-10-CM | POA: Diagnosis not present

## 2017-08-17 DIAGNOSIS — E1159 Type 2 diabetes mellitus with other circulatory complications: Secondary | ICD-10-CM

## 2017-08-17 DIAGNOSIS — M79676 Pain in unspecified toe(s): Secondary | ICD-10-CM

## 2017-08-17 NOTE — Progress Notes (Signed)
Complaint:  Visit Type: Patient returns to my office for continued preventative foot care services. Complaint: Patient states" my nails have grown long and thick and become painful to walk and wear shoes" Patient has been diagnosed with DM with no foot complications. The patient presents for preventative foot care services. No changes to ROS  Podiatric Exam: Vascular: dorsalis pedis and posterior tibial pulses are not  palpable bilateral. Capillary return is immediate. Cold feet.. Skin turgor WNL  Sensorium: Diminished  Semmes Weinstein monofilament test. Normal tactile sensation bilaterally. Nail Exam: Pt has thick disfigured discolored nails with subungual debris noted bilateral entire nail hallux through fifth toenails Ulcer Exam: There is no evidence of ulcer or pre-ulcerative changes or infection. Orthopedic Exam: Muscle tone and strength are WNL. No limitations in general ROM. No crepitus or effusions noted. Foot type and digits show no abnormalities. Bony prominences are unremarkable. Skin: No Porokeratosis. No infection or ulcers  Diagnosis:  Onychomycosis, , Pain in right toe, pain in left toes Diabetes with angiopathy  Treatment & Plan Procedures and Treatment: Consent by patient was obtained for treatment procedures. The patient understood the discussion of treatment and procedures well. All questions were answered thoroughly reviewed. Debridement of mycotic and hypertrophic toenails, 1 through 5 bilateral and clearing of subungual debris. No ulceration, no infection noted.  Return Visit-Office Procedure: Patient instructed to return to the office for a follow up visit 3 months for continued evaluation and treatment.    Helane GuntherGregory Kaori Jumper DPM

## 2017-10-25 ENCOUNTER — Encounter: Payer: Self-pay | Admitting: Family Medicine

## 2017-10-25 ENCOUNTER — Ambulatory Visit: Payer: Federal, State, Local not specified - PPO | Admitting: Family Medicine

## 2017-10-25 ENCOUNTER — Other Ambulatory Visit: Payer: Self-pay

## 2017-10-25 VITALS — BP 136/66 | HR 85 | Temp 98.6°F | Resp 16 | Wt 217.8 lb

## 2017-10-25 DIAGNOSIS — Z6833 Body mass index (BMI) 33.0-33.9, adult: Secondary | ICD-10-CM

## 2017-10-25 DIAGNOSIS — Z720 Tobacco use: Secondary | ICD-10-CM | POA: Diagnosis not present

## 2017-10-25 DIAGNOSIS — E890 Postprocedural hypothyroidism: Secondary | ICD-10-CM

## 2017-10-25 DIAGNOSIS — I10 Essential (primary) hypertension: Secondary | ICD-10-CM

## 2017-10-25 DIAGNOSIS — E785 Hyperlipidemia, unspecified: Secondary | ICD-10-CM

## 2017-10-25 DIAGNOSIS — E1142 Type 2 diabetes mellitus with diabetic polyneuropathy: Secondary | ICD-10-CM | POA: Diagnosis not present

## 2017-10-25 DIAGNOSIS — E113299 Type 2 diabetes mellitus with mild nonproliferative diabetic retinopathy without macular edema, unspecified eye: Secondary | ICD-10-CM

## 2017-10-25 DIAGNOSIS — G118 Other hereditary ataxias: Secondary | ICD-10-CM

## 2017-10-25 DIAGNOSIS — E6609 Other obesity due to excess calories: Secondary | ICD-10-CM

## 2017-10-25 LAB — POCT GLYCOSYLATED HEMOGLOBIN (HGB A1C): Hemoglobin A1C: 7.6

## 2017-10-25 MED ORDER — ATORVASTATIN CALCIUM 40 MG PO TABS: 40.0000 mg | ORAL_TABLET | Freq: Every day | ORAL | 3 refills | Status: DC

## 2017-10-25 MED ORDER — LISINOPRIL-HYDROCHLOROTHIAZIDE 20-25 MG PO TABS: 1.0000 | ORAL_TABLET | Freq: Every day | ORAL | 1 refills | Status: DC

## 2017-10-25 MED ORDER — VITAMIN D3 125 MCG (5000 UT) PO CAPS: 5000.0000 [IU] | ORAL_CAPSULE | Freq: Every day | ORAL | Status: AC

## 2017-10-25 MED ORDER — METFORMIN HCL 1000 MG PO TABS: 1000.0000 mg | ORAL_TABLET | Freq: Two times a day (BID) | ORAL | 1 refills | Status: DC

## 2017-10-25 MED ORDER — METOPROLOL SUCCINATE ER 100 MG PO TB24: 100.0000 mg | ORAL_TABLET | Freq: Every day | ORAL | 1 refills | Status: DC

## 2017-10-25 MED ORDER — LEVOTHYROXINE SODIUM 125 MCG PO TABS: 125.0000 ug | ORAL_TABLET | Freq: Every day | ORAL | 1 refills | Status: DC

## 2017-10-25 MED ORDER — GLIMEPIRIDE 4 MG PO TABS: 8.0000 mg | ORAL_TABLET | Freq: Every day | ORAL | 1 refills | Status: DC

## 2017-10-25 NOTE — Patient Instructions (Addendum)
Increase your glimepiride to 2 tabs in the morning rather than 1 and start on the recumbent exercise bike for 10 minutes EVERY DAY!    IF you received an x-ray today, you will receive an invoice from [facility]. Please contact [facility] at [phone] with questions or concerns regarding your invoice.   IF you received labwork today, you will receive an invoice from [facility]. Please contact [facility] at [phone] with questions or concerns regarding your invoice.   Our billing staff will not be able to assist you with questions regarding bills from these companies.  You will be contacted with the lab results as soon as they are available. The fastest way to get your results is to activate your My Chart account. Instructions are located on the last page of this paperwork. If you have not heard from [place] regarding the results in 2 weeks, please contact this office.     Diabetes Mellitus and Nutrition When you have diabetes (diabetes mellitus), it is very important to have healthy eating habits because your blood sugar (glucose) levels are greatly affected by what you eat and drink. Eating healthy foods in the appropriate amounts, at about the same times every day, can help you:  Control your blood glucose.  Lower your risk of heart disease.  Improve your blood pressure.  Reach or maintain a healthy weight.  Every person with diabetes is different, and each person has different needs for a meal plan. Your health care provider may recommend that you work with a diet and nutrition specialist (dietitian) to make a meal plan that is best for you. Your meal plan may vary depending on factors such as:  The calories you need.  The medicines you take.  Your weight.  Your blood glucose, blood pressure, and cholesterol levels.  Your activity level.  Other health conditions you have, such as heart or kidney disease.  How do carbohydrates affect me? Carbohydrates affect  your blood glucose level more than any other type of food. Eating carbohydrates naturally increases the amount of glucose in your blood. Carbohydrate counting is a method for keeping track of how many carbohydrates you eat. Counting carbohydrates is important to keep your blood glucose at a healthy level, especially if you use insulin or take certain oral diabetes medicines. It is important to know how many carbohydrates you can safely have in each meal. This is different for every person. Your dietitian can help you calculate how many carbohydrates you should have at each meal and for snack. Foods that contain carbohydrates include:  Bread, cereal, rice, pasta, and crackers.  Potatoes and corn.  Peas, beans, and lentils.  Milk and yogurt.  Fruit and juice.  Desserts, such as cakes, cookies, ice cream, and candy.  How does alcohol affect me? Alcohol can cause a sudden decrease in blood glucose (hypoglycemia), especially if you use insulin or take certain oral diabetes medicines. Hypoglycemia can be a life-threatening condition. Symptoms of hypoglycemia (sleepiness, dizziness, and confusion) are similar to symptoms of having too much alcohol. If your health care provider says that alcohol is safe for you, follow these guidelines:  Limit alcohol intake to no more than 1 drink per day for nonpregnant women and 2 drinks per day for men. One drink equals 12 oz of beer, 5 oz of wine, or 1 oz of hard liquor.  Do not drink on an empty stomach.  Keep yourself hydrated with water, diet soda, or unsweetened iced tea.  Keep in mind that  regular soda, juice, and other mixers may contain a lot of sugar and must be counted as carbohydrates.  What are tips for following this plan? Reading food labels  Start by checking the serving size on the label. The amount of calories, carbohydrates, fats, and other nutrients listed on the label are based on one serving of the food. Many foods contain more than  one serving per package.  Check the total grams (g) of carbohydrates in one serving. You can calculate the number of servings of carbohydrates in one serving by dividing the total carbohydrates by 15. For example, if a food has 30 g of total carbohydrates, it would be equal to 2 servings of carbohydrates.  Check the number of grams (g) of saturated and trans fats in one serving. Choose foods that have low or no amount of these fats.  Check the number of milligrams (mg) of sodium in one serving. Most people should limit total sodium intake to less than 2,300 mg per day.  Always check the nutrition information of foods labeled as "low-fat" or "nonfat". These foods may be higher in added sugar or refined carbohydrates and should be avoided.  Talk to your dietitian to identify your daily goals for nutrients listed on the label. Shopping  Avoid buying canned, premade, or processed foods. These foods tend to be high in fat, sodium, and added sugar.  Shop around the outside edge of the grocery store. This includes fresh fruits and vegetables, bulk grains, fresh meats, and fresh dairy. Cooking  Use low-heat cooking methods, such as baking, instead of high-heat cooking methods like deep frying.  Cook using healthy oils, such as olive, canola, or sunflower oil.  Avoid cooking with butter, cream, or high-fat meats. Meal planning  Eat meals and snacks regularly, preferably at the same times every day. Avoid going long periods of time without eating.  Eat foods high in fiber, such as fresh fruits, vegetables, beans, and whole grains. Talk to your dietitian about how many servings of carbohydrates you can eat at each meal.  Eat 4-6 ounces of lean protein each day, such as lean meat, chicken, fish, eggs, or tofu. 1 ounce is equal to 1 ounce of meat, chicken, or fish, 1 egg, or 1/4 cup of tofu.  Eat some foods each day that contain healthy fats, such as avocado, nuts, seeds, and  fish. Lifestyle   Check your blood glucose regularly.  Exercise at least 30 minutes 5 or more days each week, or as told by your health care provider.  Take medicines as told by your health care provider.  Do not use any products that contain nicotine or tobacco, such as cigarettes and e-cigarettes. If you need help quitting, ask your health care provider.  Work with a Veterinary surgeon or diabetes educator to identify strategies to manage stress and any emotional and social challenges. What are some questions to ask my health care provider?  Do I need to meet with a diabetes educator?  Do I need to meet with a dietitian?  What number can I call if I have questions?  When are the best times to check my blood glucose? Where to find more information:  American Diabetes Association: diabetes.org/food-and-fitness/food  Academy of Nutrition and Dietetics: https://www.vargas.com/  [employer] of Diabetes and Digestive and Kidney Diseases (NIH): FindJewelers.cz Summary  A healthy meal plan will help you control your blood glucose and maintain a healthy lifestyle.  Working with a diet and nutrition specialist (dietitian) can help you  make a meal plan that is best for you.  Keep in mind that carbohydrates and alcohol have immediate effects on your blood glucose levels. It is important to count carbohydrates and to use alcohol carefully. This information is not intended to replace advice given to you by your health care provider. Make sure you discuss any questions you have with your health care provider. Document Released: 06/22/2005 Document Revised: 10/30/2016 Document Reviewed: 10/30/2016 Elsevier Interactive Patient Education  2018 [employer].  Coping with Quitting Smoking Quitting smoking is a physical and mental challenge. You will face cravings, withdrawal symptoms,  and temptation. Before quitting, work with your health care provider to make a plan that can help you cope. Preparation can help you quit and keep you from giving in. How can I cope with cravings? Cravings usually last for 5-10 minutes. If you get through it, the craving will pass. Consider taking the following actions to help you cope with cravings:  Keep your mouth busy: ? Chew sugar-free gum. ? Suck on hard candies or a straw. ? Brush your teeth.  Keep your hands and body busy: ? Immediately change to a different activity when you feel a craving. ? Squeeze or play with a ball. ? Do an activity or a hobby, like making bead jewelry, practicing needlepoint, or working with wood. ? Mix up your normal routine. ? Take a short exercise break. Go for a quick walk or run up and down stairs. ? Spend time in public places where smoking is not allowed.  Focus on doing something kind or helpful for someone else.  Call a friend or family member to talk during a craving.  Join a support group.  Call a quit line, such as 1-800-QUIT-NOW.  Talk with your health care provider about medicines that might help you cope with cravings and make quitting easier for you.  How can I deal with withdrawal symptoms? Your body may experience negative effects as it tries to get used to not having nicotine in the system. These effects are called withdrawal symptoms. They may include:  Feeling hungrier than normal.  Trouble concentrating.  Irritability.  Trouble sleeping.  Feeling depressed.  Restlessness and agitation.  Craving a cigarette.  To manage withdrawal symptoms:  Avoid places, people, and activities that trigger your cravings.  Remember why you want to quit.  Get plenty of sleep.  Avoid coffee and other caffeinated drinks. These may worsen some of your symptoms.  How can I handle social situations? Social situations can be difficult when you are quitting smoking, especially in the  first few weeks. To manage this, you can:  Avoid parties, bars, and other social situations where people might be smoking.  Avoid alcohol.  Leave right away if you have the urge to smoke.  Explain to your family and friends that you are quitting smoking. Ask for understanding and support.  Plan activities with friends or family where smoking is not an option.  What are some ways I can cope with stress? Wanting to smoke may cause stress, and stress can make you want to smoke. Find ways to manage your stress. Relaxation techniques can help. For example:  Breathe slowly and deeply, in through your nose and out through your mouth.  Listen to soothing, relaxing music.  Talk with a family member or friend about your stress.  Light a candle.  Soak in a bath or take a shower.  Think about a peaceful place.  What are some ways I can  prevent weight gain? Be aware that many people gain weight after they quit smoking. However, not everyone does. To keep from gaining weight, have a plan in place before you quit and stick to the plan after you quit. Your plan should include:  Having healthy snacks. When you have a craving, it may help to: ? Eat plain popcorn, crunchy carrots, celery, or other cut vegetables. ? Chew sugar-free gum.  Changing how you eat: ? Eat small portion sizes at meals. ? Eat 4-6 small meals throughout the day instead of 1-2 large meals a day. ? Be mindful when you eat. Do not watch television or do other things that might distract you as you eat.  Exercising regularly: ? Make time to exercise each day. If you do not have time for a long workout, do short bouts of exercise for 5-10 minutes several times a day. ? Do some form of strengthening exercise, like weight lifting, and some form of aerobic exercise, like running or swimming.  Drinking plenty of water or other low-calorie or no-calorie drinks. Drink 6-8 glasses of water daily, or as much as instructed by your  health care provider.  Summary  Quitting smoking is a physical and mental challenge. You will face cravings, withdrawal symptoms, and temptation to smoke again. Preparation can help you as you go through these challenges.  You can cope with cravings by keeping your mouth busy (such as by chewing gum), keeping your body and hands busy, and making calls to family, friends, or a helpline for people who want to quit smoking.  You can cope with withdrawal symptoms by avoiding places where people smoke, avoiding drinks with caffeine, and getting plenty of rest.  Ask your health care provider about the different ways to prevent weight gain, avoid stress, and handle social situations. This information is not intended to replace advice given to you by your health care provider. Make sure you discuss any questions you have with your health care provider. Document Released: 09/22/2016 Document Revised: 09/22/2016 Document Reviewed: 09/22/2016 Elsevier Interactive Patient Education  [employer]2018 Elsevier Inc.

## 2017-10-25 NOTE — Progress Notes (Signed)
Subjective:  By signing my name below, I, Moises Blood, attest that this documentation has been prepared under the direction and in the presence of [name], MD. Electronically Signed: Moises Blood, [place]. 10/25/2017 , 9:54 AM .  Patient was seen in Room 1 .   Patient ID: Randy Ballard, male    DOB: Nov 16, 1960, 57 y.o.   MRN: [id number] Chief Complaint  Patient presents with  . Follow-up    medication and labwork  . Medication Refill    SEE MEDICATION with RFs beside them   HPI Randy Ballard is a 57 y.o. male who presents to Primary Care at [facility] for follow up and medication refill.   Diabetes Patient reports stopping Xigduo about 2 months ago due to increased urinary frequency, going every 15-20 minutes. He states it led to having yeast infections about 2 weeks into the medication. After stopping, he switched back to metformin. His sugar was about 164 after breakfast about 2 weeks ago. He denies any hypoglycemic episodes.   HTN He checks his BP occasionally, last checked 137/72 outside of office. He denies lightheadedness or dizziness. He denies any bowel or abdominal symptoms. He has a BM once every other day; denies constipation.   Exercise He started a new exercise routine a week ago with home recumbent bike. He states this is one of his goals for the new year of 2019: exercising more, lower A1C, and quit smoking.   Smoking cessation: He's been able to decrease his smoking, currently down to 2-3 days without smoking. If he keeps himself busy working in the yard or other activities, he is able to not smoke. He notes most trouble at night, as he had a habit of smoking a cigarette while drinking some coffee when watching YouTube.   Past Medical History:  Diagnosis Date  . Diabetes mellitus without complication ([place])   . Diverticulitis   . Hyperlipidemia   . Hypertension   . Hypogonadism male   . Neuromuscular disorder ([place])   . SCA-3 (spinocerebellar ataxia type 3)  ([place])   . Thyroid disease 02/06/2009   [place] s/p RAIU; cold nodule s/p negative biopsy   Past Surgical History:  Procedure Laterality Date  . BIOPSY THYROID  02/06/2009   cold thyroid nodule; negative/benign.  . COLONOSCOPY W/ POLYPECTOMY  Feb 2014   5 polyps  . TOOTH EXTRACTION  10/2015   x 4 teeth   Prior to Admission medications   Medication Sig Start Date End Date Taking? Authorizing Provider  aspirin 81 MG tablet Take 1 tablet (81 mg total) by mouth daily. 12/23/11   Hayden Rasmussen, MD  atorvastatin (LIPITOR) 40 MG tablet Take 1 tablet (40 mg total) by mouth daily. 06/21/17   Shawnee Knapp, MD  Blood Glucose Monitoring Suppl (BLOOD GLUCOSE METER) kit Test blood sugar daily 12/19/13   Shawnee Knapp, MD  calcium-vitamin D (OSCAL WITH D) 500-200 MG-UNIT per tablet Take 1 tablet by mouth 2 (two) times daily.    [provider]  Dapagliflozin-Metformin HCl ER (XIGDUO XR) 2.02-999 MG TB24 Take 2 tablets by mouth daily. 06/20/17   Shawnee Knapp, MD  etodolac (LODINE) 200 MG capsule Take 1-2 capsules (200-400 mg total) by mouth 2 (two) times daily as needed (back pain). 11/30/16   Shawnee Knapp, MD  glimepiride (AMARYL) 4 MG tablet Take 1 tablet (4 mg total) by mouth daily with breakfast. 06/21/17   Shawnee Knapp, MD  glucose blood test strip Test  blood sugar daily. 12/19/13   Shawnee Knapp, MD  Lancet Devices (LANCING DEVICE) MISC Test blood sugar daily. 12/19/13   Shawnee Knapp, MD  levothyroxine (SYNTHROID, LEVOTHROID) 125 MCG tablet Take 1 tablet (125 mcg total) by mouth daily before breakfast. 11/30/16   Shawnee Knapp, MD  lisinopril-hydrochlorothiazide (PRINZIDE,ZESTORETIC) 20-25 MG tablet Take 1 tablet by mouth daily. 06/21/17   Shawnee Knapp, MD  metFORMIN (GLUCOPHAGE) 1000 MG tablet Take 1 tablet (1,000 mg total) by mouth 2 (two) times daily with a meal. 06/26/17   Shawnee Knapp, MD  metoprolol succinate (TOPROL-XL) 100 MG 24 hr tablet Take 1 tablet (100 mg total) by mouth daily. Take with or immediately  following a meal. 06/21/17   Shawnee Knapp, MD  ranitidine (ZANTAC) 150 MG tablet Take 1 tablet (150 mg total) by mouth 2 (two) times daily as needed for heartburn. 05/25/16   Shawnee Knapp, MD   Allergies  Allergen Reactions  . Januvia [Sitagliptin] Nausea And Vomiting    pancreatitis   Family History  Adopted: Yes  Problem Relation Age of Onset  . Ataxia Mother 42       SCA3  . Ataxia Maternal Grandmother        SCA3  . Ataxia Maternal Aunt        SCA3   Social History   Socioeconomic History  . Marital status: Married    Spouse name: Helene Kelp  . Number of children: 0  . Years of education: HS  . Highest education level: None  Social Needs  . Financial resource strain: None  . Food insecurity - worry: None  . Food insecurity - inability: None  . Transportation needs - medical: None  . Transportation needs - non-medical: None  Occupational History  . Occupation: service rep  . Occupation: retired    Fish farm manager: [employer]  Tobacco Use  . Smoking status: Current Some Day Smoker    Packs/day: 0.50    Years: 32.00    Pack years: 16.00    Types: Cigarettes  . Smokeless tobacco: Never Used  . Tobacco comment: 10/26/15 trying to quit  Substance and Sexual Activity  . Alcohol use: No    Alcohol/week: 0.0 oz  . Drug use: No  . Sexual activity: Yes    Comment: 1 partner in last 12 months  Other Topics Concern  . None  Social History Narrative   Marital status:  Married x 20 years; happily.      Children: none      Lives: with wife.      Employment: delivers oxygen; works for [employer]; drives locally      Tobacco: trying to quit 10/2012.      Alcohol: None      Drugs: none      Exercise: treadmill sporadic.   Depression screen [facility] 2/9 10/25/2017 06/20/2017 01/12/2017 01/08/2017 11/30/2016  Decreased Interest 0 0 0 0 0  Down, Depressed, Hopeless 0 0 0 0 0  PHQ - 2 Score 0 0 0 0 0    Review of Systems  Constitutional: Negative for fatigue and unexpected weight change.  Eyes:  Negative for visual disturbance.  Respiratory: Negative for cough, chest tightness and shortness of breath.   Cardiovascular: Negative for chest pain, palpitations and leg swelling.  Gastrointestinal: Negative for abdominal pain and blood in stool.  Neurological: Negative for dizziness, light-headedness and headaches.       Objective:   Physical Exam  Constitutional: He is oriented to  person, place, and time. He appears well-developed and well-nourished. No distress.  HENT:  Head: Normocephalic and atraumatic.  Eyes: EOM are normal. Pupils are equal, round, and reactive to light.  Neck: Neck supple. No thyromegaly present.  Cardiovascular: Normal rate, regular rhythm, S1 normal, S2 normal and normal heart sounds.  No murmur heard. Pulmonary/Chest: Effort normal and breath sounds normal. No respiratory distress. He has no wheezes.  Musculoskeletal: Normal range of motion.  Lymphadenopathy:    He has no cervical adenopathy.  Neurological: He is alert and oriented to person, place, and time.  Skin: Skin is warm and dry.  Psychiatric: He has a normal mood and affect. His behavior is normal.  Nursing note and vitals reviewed.   BP 136/66 (BP Location: Left Arm, Patient Position: Sitting, Cuff Size: Large)   Pulse 85   Temp 98.6 F (37 C) (Oral)   Resp 16   Wt 217 lb 12.8 oz (98.8 kg) Comment: with shoes  SpO2 100%   BMI 33.61 kg/m      Assessment & Plan:   1. 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 Postablative hypothyroidism   3. Essential hypertension   4. Dyslipidemia   5. SCA-3 (spinocerebellar ataxia type 3) ([facility])   6. Class 1 obesity due to excess calories with serious comorbidity and body mass index (BMI) of 33.0 to 33.9 in adult   7. Tobacco abuse   8. Hyperlipidemia LDL goal <100   9. Type 2 diabetes mellitus with diabetic  polyneuropathy, without long-term current use of insulin ([place])   10. Essential hypertension, benign    Pt wants to work on tlc prior to med change for DM - not sure if the GLP-1 would be ok to use since he had pancreatitis from the DPP4 prior.   Orders Placed This Encounter  Procedures  . Comprehensive metabolic panel  . TSH  . TSH  . POCT glycosylated hemoglobin (Hb A1C)    Meds ordered this encounter  Medications  . atorvastatin (LIPITOR) 40 MG tablet    Sig: Take 1 tablet (40 mg total) by mouth daily.    Dispense:  90 tablet    Refill:  3  . glimepiride (AMARYL) 4 MG tablet    Sig: Take 2 tablets (8 mg total) by mouth daily with breakfast.    Dispense:  180 tablet    Refill:  1  . levothyroxine (SYNTHROID, LEVOTHROID) 125 MCG tablet    Sig: Take 1 tablet (125 mcg total) by mouth daily before breakfast.    Dispense:  90 tablet    Refill:  1  . lisinopril-hydrochlorothiazide (PRINZIDE,ZESTORETIC) 20-25 MG tablet    Sig: Take 1 tablet by mouth daily.    Dispense:  90 tablet    Refill:  1  . metFORMIN (GLUCOPHAGE) 1000 MG tablet    Sig: Take 1 tablet (1,000 mg total) by mouth 2 (two) times daily with a meal.    Dispense:  180 tablet    Refill:  1  . metoprolol succinate (TOPROL-XL) 100 MG 24 hr tablet    Sig: Take 1 tablet (100 mg total) by mouth daily. Take with or immediately following a meal.    Dispense:  90 tablet    Refill:  1  . Cholecalciferol (VITAMIN D3) 5000 units CAPS    Sig: Take 1 capsule (5,000 Units total) by mouth daily.    I personally  performed the services described in this documentation, which was scribed in my presence. The recorded information has been reviewed and considered, and addended by me as needed.   [name], M.D.  Primary Care at [facility] [address] [place], [place] [address] [phone] phone [phone] fax  10/28/17 7:51 AM

## 2017-10-26 LAB — TSH: TSH: 1.39 u[IU]/mL (ref 0.450–4.500)

## 2017-10-26 LAB — COMPREHENSIVE METABOLIC PANEL
ALBUMIN: 4.8 g/dL (ref 3.5–5.5)
ALK PHOS: 85 IU/L (ref 39–117)
ALT: 26 IU/L (ref 0–44)
AST: 22 IU/L (ref 0–40)
Albumin/Globulin Ratio: 1.7 (ref 1.2–2.2)
BUN / CREAT RATIO: 11 (ref 9–20)
BUN: 10 mg/dL (ref 6–24)
Bilirubin Total: 0.3 mg/dL (ref 0.0–1.2)
CALCIUM: 10 mg/dL (ref 8.7–10.2)
CO2: 24 mmol/L (ref 20–29)
CREATININE: 0.9 mg/dL (ref 0.76–1.27)
Chloride: 100 mmol/L (ref 96–106)
GFR calc Af Amer: 109 mL/min/{1.73_m2} (ref >59)
GFR, EST NON AFRICAN AMERICAN: 94 mL/min/{1.73_m2} (ref >59)
GLUCOSE: 172 mg/dL — AB (ref 65–99)
Globulin, Total: 2.9 g/dL (ref 1.5–4.5)
Potassium: 4.3 mmol/L (ref 3.5–5.2)
Sodium: 142 mmol/L (ref 134–144)
TOTAL PROTEIN: 7.7 g/dL (ref 6.0–8.5)

## 2017-10-30 DIAGNOSIS — K080 Exfoliation of teeth due to systemic causes: Secondary | ICD-10-CM | POA: Diagnosis not present

## 2017-11-16 ENCOUNTER — Ambulatory Visit: Payer: Federal, State, Local not specified - PPO | Admitting: Podiatry

## 2017-11-16 ENCOUNTER — Encounter: Payer: Self-pay | Admitting: Podiatry

## 2017-11-16 DIAGNOSIS — M79676 Pain in unspecified toe(s): Secondary | ICD-10-CM

## 2017-11-16 DIAGNOSIS — B351 Tinea unguium: Secondary | ICD-10-CM | POA: Diagnosis not present

## 2017-11-16 DIAGNOSIS — E1159 Type 2 diabetes mellitus with other circulatory complications: Secondary | ICD-10-CM | POA: Diagnosis not present

## 2017-11-16 NOTE — Progress Notes (Signed)
Complaint:  Visit Type: Patient returns to my office for continued preventative foot care services. Complaint: Patient states" my nails have grown long and thick and become painful to walk and wear shoes" Patient has been diagnosed with DM with no foot complications. The patient presents for preventative foot care services. No changes to ROS  Podiatric Exam: Vascular: dorsalis pedis and posterior tibial pulses are not  palpable bilateral. Capillary return is immediate. Cold feet.. Skin turgor WNL  Sensorium: Diminished  Semmes Weinstein monofilament test left foot.  LOPS WNL right foot. Normal tactile sensation bilaterally. Nail Exam: Pt has thick disfigured discolored nails with subungual debris noted bilateral entire nail hallux through fifth toenails Ulcer Exam: There is no evidence of ulcer or pre-ulcerative changes or infection. Orthopedic Exam: Muscle tone and strength are WNL. No limitations in general ROM. No crepitus or effusions noted. Foot type and digits show no abnormalities. Bony prominences are unremarkable. Skin: No Porokeratosis. No infection or ulcers  Diagnosis:  Onychomycosis, , Pain in right toe, pain in left toes Diabetes with angiopathy  Treatment & Plan Procedures and Treatment: Consent by patient was obtained for treatment procedures. The patient understood the discussion of treatment and procedures well. All questions were answered thoroughly reviewed. Debridement of mycotic and hypertrophic toenails, 1 through 5 bilateral and clearing of subungual debris. No ulceration, no infection noted.  Return Visit-Office Procedure: Patient instructed to return to the office for a follow up visit 3 months for continued evaluation and treatment.    Tynetta Bachmann DPM 

## 2017-11-28 DIAGNOSIS — K080 Exfoliation of teeth due to systemic causes: Secondary | ICD-10-CM | POA: Diagnosis not present

## 2017-12-05 DIAGNOSIS — K080 Exfoliation of teeth due to systemic causes: Secondary | ICD-10-CM | POA: Diagnosis not present

## 2018-01-23 DIAGNOSIS — K080 Exfoliation of teeth due to systemic causes: Secondary | ICD-10-CM | POA: Diagnosis not present

## 2018-02-08 ENCOUNTER — Ambulatory Visit: Payer: Federal, State, Local not specified - PPO | Admitting: Podiatry

## 2018-02-08 ENCOUNTER — Encounter: Payer: Self-pay | Admitting: Podiatry

## 2018-02-08 DIAGNOSIS — M79676 Pain in unspecified toe(s): Secondary | ICD-10-CM | POA: Diagnosis not present

## 2018-02-08 DIAGNOSIS — E1159 Type 2 diabetes mellitus with other circulatory complications: Secondary | ICD-10-CM

## 2018-02-08 DIAGNOSIS — B351 Tinea unguium: Secondary | ICD-10-CM

## 2018-02-08 NOTE — Progress Notes (Addendum)
Complaint:  Visit Type: Patient returns to my office for continued preventative foot care services. Complaint: Patient states" my nails have grown long and thick and become painful to walk and wear shoes" Patient has been diagnosed with DM with no foot complications. The patient presents for preventative foot care services. No changes to ROS  Podiatric Exam: Vascular: dorsalis pedis and posterior tibial pulses are not  palpable bilateral. Capillary return is immediate. Cold feet.. Skin turgor WNL  Sensorium: Diminished  Semmes Weinstein monofilament test left foot.  LOPS WNL right foot. Normal tactile sensation bilaterally. Nail Exam: Pt has thick disfigured discolored nails with subungual debris noted bilateral entire nail hallux through fifth toenails Ulcer Exam: There is no evidence of ulcer or pre-ulcerative changes or infection. Orthopedic Exam: Muscle tone and strength are WNL. No limitations in general ROM. No crepitus or effusions noted. Foot type and digits show no abnormalities. Bony prominences are unremarkable. Skin: No Porokeratosis. No infection or ulcers  Diagnosis:  Onychomycosis, , Pain in right toe, pain in left toes Diabetes with angiopathy  Treatment & Plan Procedures and Treatment: Consent by patient was obtained for treatment procedures. The patient understood the discussion of treatment and procedures well. All questions were answered thoroughly reviewed. Debridement of mycotic and hypertrophic toenails, 1 through 5 bilateral and clearing of subungual debris. No ulceration, no infection noted. ABN signed for 2019. Return Visit-Office Procedure: Patient instructed to return to the office for a follow up visit 3 months for continued evaluation and treatment.    Helane Gunther DPM

## 2018-02-15 ENCOUNTER — Other Ambulatory Visit: Payer: Self-pay | Admitting: Family Medicine

## 2018-02-15 DIAGNOSIS — E1142 Type 2 diabetes mellitus with diabetic polyneuropathy: Secondary | ICD-10-CM

## 2018-02-19 DIAGNOSIS — K080 Exfoliation of teeth due to systemic causes: Secondary | ICD-10-CM | POA: Diagnosis not present

## 2018-02-21 ENCOUNTER — Ambulatory Visit: Payer: Federal, State, Local not specified - PPO | Admitting: Family Medicine

## 2018-03-19 ENCOUNTER — Other Ambulatory Visit: Payer: Self-pay

## 2018-03-19 ENCOUNTER — Ambulatory Visit (INDEPENDENT_AMBULATORY_CARE_PROVIDER_SITE_OTHER): Payer: Federal, State, Local not specified - PPO

## 2018-03-19 ENCOUNTER — Encounter: Payer: Self-pay | Admitting: Family Medicine

## 2018-03-19 ENCOUNTER — Ambulatory Visit: Payer: Federal, State, Local not specified - PPO | Admitting: Family Medicine

## 2018-03-19 VITALS — BP 124/70 | HR 88 | Temp 98.2°F | Ht 67.5 in | Wt 217.6 lb

## 2018-03-19 DIAGNOSIS — I10 Essential (primary) hypertension: Secondary | ICD-10-CM

## 2018-03-19 DIAGNOSIS — E113299 Type 2 diabetes mellitus with mild nonproliferative diabetic retinopathy without macular edema, unspecified eye: Secondary | ICD-10-CM | POA: Diagnosis not present

## 2018-03-19 DIAGNOSIS — Z720 Tobacco use: Secondary | ICD-10-CM

## 2018-03-19 DIAGNOSIS — Z125 Encounter for screening for malignant neoplasm of prostate: Secondary | ICD-10-CM

## 2018-03-19 DIAGNOSIS — Z23 Encounter for immunization: Secondary | ICD-10-CM

## 2018-03-19 DIAGNOSIS — E785 Hyperlipidemia, unspecified: Secondary | ICD-10-CM

## 2018-03-19 DIAGNOSIS — R0609 Other forms of dyspnea: Secondary | ICD-10-CM | POA: Diagnosis not present

## 2018-03-19 DIAGNOSIS — E890 Postprocedural hypothyroidism: Secondary | ICD-10-CM

## 2018-03-19 DIAGNOSIS — Z6834 Body mass index (BMI) 34.0-34.9, adult: Secondary | ICD-10-CM

## 2018-03-19 DIAGNOSIS — G118 Other hereditary ataxias: Secondary | ICD-10-CM

## 2018-03-19 LAB — POCT GLYCOSYLATED HEMOGLOBIN (HGB A1C): HEMOGLOBIN A1C: 8.6 % — AB (ref 4.0–5.6)

## 2018-03-19 LAB — GLUCOSE, POCT (MANUAL RESULT ENTRY): POC Glucose: 215 mg/dl — AB (ref 70–99)

## 2018-03-19 MED ORDER — IPRATROPIUM BROMIDE 0.02 % IN SOLN
0.5000 mg | Freq: Once | RESPIRATORY_TRACT | Status: AC
  Administered 2018-03-19: 0.5 mg via RESPIRATORY_TRACT

## 2018-03-19 MED ORDER — ALBUTEROL SULFATE (2.5 MG/3ML) 0.083% IN NEBU
2.5000 mg | INHALATION_SOLUTION | Freq: Once | RESPIRATORY_TRACT | Status: AC
  Administered 2018-03-19: 2.5 mg via RESPIRATORY_TRACT

## 2018-03-19 NOTE — Progress Notes (Signed)
Subjective:  By signing my name below, I, Essence Howell, attest that this documentation has been prepared under the direction and in the presence of [name], MD Electronically Signed: Ladene Artist, ED Scribe 03/19/2018 at 10:45 AM.   Patient ID: Randy Ballard, male    DOB: 10-09-1961, 57 y.o.   MRN: [id number]  Chief Complaint  Patient presents with  . Follow-up    Pt states he is feeling ok   . Diabetes   HPI Randy Ballard is a 57 y.o. male who presents to Primary Care at [facility] for f/u of DM. Pt is not fasting at this visit. He states that his blood glucose seems to fluctuate from 165-285 after meals. Pneumonia vaccine updated today. Denies hypoglycemic episodes.  HTN Denies dizziness, lightheadedness, any other side-effects.  Tobacco Use Pt states that he smokes 1/2 ppd but skips the weekends. He has stopped smoking in the car; plans to stop smoking while on YouTube. Pt has tried Chantix ~4 yrs ago but had vivid dreams with this. Denies cough. In 2014, PF was 600. No h/o inhaler use.  Allergies Pt has been gardening recently; planting tomatoes, squash, bell peppers.  Past Medical History:  Diagnosis Date  . Diabetes mellitus without complication ([place])   . Diverticulitis   . Hyperlipidemia   . Hypertension   . Hypogonadism male   . Neuromuscular disorder ([place])   . 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 Thyroid disease 02/06/2009   [place] s/p RAIU; cold nodule s/p negative biopsy   Current Outpatient Medications on File Prior to Visit  Medication Sig Dispense Refill  . aspirin 81 MG tablet Take 1 tablet (81 mg total) by mouth daily. 30 tablet 11  . atorvastatin (LIPITOR) 40 MG tablet Take 1 tablet (40 mg total) by mouth daily. 90 tablet 3  . Blood Glucose Monitoring Suppl (BLOOD GLUCOSE METER) kit Test blood sugar daily 1 each 0  . calcium-vitamin D (OSCAL WITH D) 500-200 MG-UNIT per tablet Take 1 tablet by mouth 2 (two) times daily.    . Cholecalciferol  (VITAMIN D3) 5000 units CAPS Take 1 capsule (5,000 Units total) by mouth daily.    Marland Kitchen glimepiride (AMARYL) 4 MG tablet Take 2 tablets (8 mg total) by mouth daily with breakfast. 180 tablet 1  . glucose blood test strip Test blood sugar daily. 100 each 3  . Lancet Devices (LANCING DEVICE) MISC Test blood sugar daily. 1 each 0  . levothyroxine (SYNTHROID, LEVOTHROID) 125 MCG tablet Take 1 tablet (125 mcg total) by mouth daily before breakfast. 90 tablet 1  . lisinopril-hydrochlorothiazide (PRINZIDE,ZESTORETIC) 20-25 MG tablet Take 1 tablet by mouth daily. 90 tablet 1  . metFORMIN (GLUCOPHAGE) 1000 MG tablet Take 1 tablet (1,000 mg total) by mouth 2 (two) times daily with a meal. 180 tablet 1  . metoprolol succinate (TOPROL-XL) 100 MG 24 hr tablet Take 1 tablet (100 mg total) by mouth daily. Take with or immediately following a meal. 90 tablet 1  . ranitidine (ZANTAC) 150 MG tablet Take 1 tablet (150 mg total) by mouth 2 (two) times daily as needed for heartburn. 60 tablet 5   No current facility-administered medications on file prior to visit.    Allergies  Allergen Reactions  . Januvia [Sitagliptin] Nausea And Vomiting    pancreatitis   Past Surgical History:  Procedure Laterality Date  . BIOPSY THYROID  02/06/2009   cold thyroid nodule; negative/benign.  . COLONOSCOPY W/ POLYPECTOMY  Feb 2014  5 polyps  . TOOTH EXTRACTION  10/2015   x 4 teeth   Family History  Adopted: Yes  Problem Relation Age of Onset  . Ataxia Mother 61       SCA3  . Ataxia Maternal Grandmother        SCA3  . Ataxia Maternal Aunt        SCA3   Social History   Socioeconomic History  . Marital status: Married    Spouse name: Helene Kelp  . Number of children: 0  . Years of education: HS  . Highest education level: Not on file  Occupational History  . Occupation: service rep  . Occupation: retired    Fish farm manager: [employer]  Social Needs  . Financial resource strain: Not on file  . Food insecurity:    Worry: Not  on file    Inability: Not on file  . Transportation needs:    Medical: Not on file    Non-medical: Not on file  Tobacco Use  . Smoking status: Current Some Day Smoker    Packs/day: 0.50    Years: 32.00    Pack years: 16.00    Types: Cigarettes  . Smokeless tobacco: Never Used  . Tobacco comment: 10/26/15 trying to quit  Substance and Sexual Activity  . Alcohol use: No    Alcohol/week: 0.0 standard drinks  . Drug use: No  . Sexual activity: Yes    Comment: 1 partner in last 12 months  Lifestyle  . Physical activity:    Days per week: Not on file    Minutes per session: Not on file  . Stress: Not on file  Relationships  . Social connections:    Talks on phone: Not on file    Gets together: Not on file    Attends religious service: Not on file    Active member of club or organization: Not on file    Attends meetings of clubs or organizations: Not on file    Relationship status: Not on file  Other Topics Concern  . Not on file  Social History Narrative   Marital status:  Married x 20 years; happily.      Children: none      Lives: with wife.      Employment: delivers oxygen; works for [employer]; drives locally      Tobacco: trying to quit 10/2012.      Alcohol: None      Drugs: none      Exercise: treadmill sporadic.   Depression screen [facility] 2/9 03/19/2018 10/25/2017 06/20/2017 01/12/2017 01/08/2017  Decreased Interest 0 0 0 0 0  Down, Depressed, Hopeless 0 0 0 0 0  PHQ - 2 Score 0 0 0 0 0    Review of Systems  Respiratory: Negative for cough.   Allergic/Immunologic: Positive for environmental allergies.  Neurological: Negative for dizziness and light-headedness.      Objective:   Physical Exam  Constitutional: He is oriented to person, place, and time. He appears well-developed and well-nourished. No distress.  HENT:  Head: Normocephalic and atraumatic.  Eyes: Conjunctivae and EOM are normal.  Neck: Neck supple. No tracheal deviation present.  Cardiovascular: Normal  rate, regular rhythm, S1 normal, S2 normal and normal heart sounds.  Pulmonary/Chest: Effort normal and breath sounds normal. No respiratory distress.  Musculoskeletal: Normal range of motion.  Neurological: He is alert and oriented to person, place, and time.  Skin: Skin is warm and dry.  Psychiatric: He has a normal mood and affect.  His behavior is normal.  Nursing note and vitals reviewed.  BP 124/70 (BP Location: Left Arm, Patient Position: Sitting, Cuff Size: Large)   Pulse 88   Temp 98.2 F (36.8 C) (Oral)   Ht 5' 7.5" (1.715 m)   Wt 217 lb 9.6 oz (98.7 kg)   SpO2 99%   BMI 33.58 kg/m     Results for orders placed or performed in visit on 03/19/18  POCT glycosylated hemoglobin (Hb A1C)  Result Value Ref Range   Hemoglobin A1C 8.6 (A) 4.0 - 5.6 %   HbA1c, POC (prediabetic range)  5.7 - 6.4 %   HbA1c, POC (controlled diabetic range)  0.0 - 7.0 %  POCT glucose (manual entry)  Result Value Ref Range   POC Glucose 215 (A) 70 - 99 mg/dl   CLINICAL DATA:  Dyspnea on exertion in a cigarette smoker.  EXAM: CHEST - 2 VIEW  COMPARISON:  None.  FINDINGS: The lungs are clear. Heart size is normal. No pneumothorax or pleural effusion. Aortic atherosclerosis is noted. No acute bony abnormality.  IMPRESSION: No acute disease.  Atherosclerosis.   Electronically Signed   By: Inge Rise M.D.   On: 03/19/2018 11:09  Assessment & Plan:  Pt has promised to stop smoking while on YouTube at night. Discussed starting insulin at next visit if A1C not less than 8.  1. 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 Screening for prostate cancer   3. Tobacco abuse   4. Essential hypertension   5. Postablative hypothyroidism   6. 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 Dyslipidemia   8. BMI 34.0-34.9,adult     Orders Placed This  Encounter  Procedures  . DG Chest 2 View    Standing Status:   Future    Number of Occurrences:   1    Standing Expiration Date:   03/19/2019    Order Specific Question:   Reason for Exam (SYMPTOM  OR DIAGNOSIS REQUIRED)    Answer:   long-standing smoking hx, DOE    Order Specific Question:   Preferred imaging location?    Answer:   External  . Pneumococcal polysaccharide vaccine 23-valent greater than or equal to 2yo subcutaneous/IM  . Comprehensive metabolic panel  . PSA  . PSA  . Care order/instruction:    Scheduling Instructions:     [facility] (IF NEB IS ORDERED PLEASE DO BEFORE AND AFTER NEB)  . POCT glycosylated hemoglobin (Hb A1C)  . POCT glucose (manual entry)    Meds ordered this encounter  Medications  . albuterol (PROVENTIL) (2.5 MG/3ML) 0.083% nebulizer solution 2.5 mg  . ipratropium (ATROVENT) nebulizer solution 0.5 mg   I personally performed the services described in this documentation, which was scribed in my presence. The recorded information has been reviewed and considered, and addended by me as needed.   [name], M.D.  Primary Care at [facility] [address] [place], [place] [address] [phone] phone [phone] fax  07/18/18 4:25 AM

## 2018-03-19 NOTE — Patient Instructions (Addendum)
   IF you received an x-ray today, you will receive an invoice from [facility]. Please contact [facility] at [phone] with questions or concerns regarding your invoice.   IF you received labwork today, you will receive an invoice from [facility]. Please contact [facility] at [phone] with questions or concerns regarding your invoice.   Our billing staff will not be able to assist you with questions regarding bills from these companies.  You will be contacted with the lab results as soon as they are available. The fastest way to get your results is to activate your My Chart account. Instructions are located on the last page of this paperwork. If you have not heard from us regarding the results in 2 weeks, please contact this office.     Diabetes Mellitus and Exercise Exercising regularly is important for your overall health, especially when you have diabetes (diabetes mellitus). Exercising is not only about losing weight. It has many health benefits, such as increasing muscle strength and bone density and reducing body fat and stress. This leads to improved fitness, flexibility, and endurance, all of which result in better overall health. Exercise has additional benefits for people with diabetes, including:  Reducing appetite.  Helping to lower and control blood glucose.  Lowering blood pressure.  Helping to control amounts of fatty substances (lipids) in the blood, such as cholesterol and triglycerides.  Helping the body to respond better to insulin (improving insulin sensitivity).  Reducing how much insulin the body needs.  Decreasing the risk for heart disease by: ? Lowering cholesterol and triglyceride levels. ? Increasing the levels of good cholesterol. ? Lowering blood glucose levels.  What is my activity plan? Your health care provider or certified diabetes educator can help you make a plan for the type and frequency of exercise (activity plan) that  works for you. Make sure that you:  Do at least 150 minutes of moderate-intensity or vigorous-intensity exercise each week. This could be brisk walking, biking, or water aerobics. ? Do stretching and strength exercises, such as yoga or weightlifting, at least 2 times a week. ? Spread out your activity over at least 3 days of the week.  Get some form of physical activity every day. ? Do not go more than 2 days in a row without some kind of physical activity. ? Avoid being inactive for more than 90 minutes at a time. Take frequent breaks to walk or stretch.  Choose a type of exercise or activity that you enjoy, and set realistic goals.  Start slowly, and gradually increase the intensity of your exercise over time.  What do I need to know about managing my diabetes?  Check your blood glucose before and after exercising. ? If your blood glucose is higher than 240 mg/dL (13.3 mmol/L) before you exercise, check your urine for ketones. If you have ketones in your urine, do not exercise until your blood glucose returns to normal.  Know the symptoms of low blood glucose (hypoglycemia) and how to treat it. Your risk for hypoglycemia increases during and after exercise. Common symptoms of hypoglycemia can include: ? Hunger. ? Anxiety. ? Sweating and feeling clammy. ? Confusion. ? Dizziness or feeling light-headed. ? Increased heart rate or palpitations. ? Blurry vision. ? Tingling or numbness around the mouth, lips, or tongue. ? Tremors or shakes. ? Irritability.  Keep a rapid-acting carbohydrate snack available before, during, and after exercise to help prevent or treat hypoglycemia.  Avoid injecting insulin into areas of the body   that are going to be exercised. For example, avoid injecting insulin into: ? The arms, when playing tennis. ? The legs, when jogging.  Keep records of your exercise habits. Doing this can help you and your health care provider adjust your diabetes management plan  as needed. Write down: ? Food that you eat before and after you exercise. ? Blood glucose levels before and after you exercise. ? The type and amount of exercise you have done. ? When your insulin is expected to peak, if you use insulin. Avoid exercising at times when your insulin is peaking.  When you start a new exercise or activity, work with your health care provider to make sure the activity is safe for you, and to adjust your insulin, medicines, or food intake as needed.  Drink plenty of water while you exercise to prevent dehydration or heat stroke. Drink enough fluid to keep your urine clear or pale yellow. This information is not intended to replace advice given to you by your health care provider. Make sure you discuss any questions you have with your health care provider. Document Released: 12/16/2003 Document Revised: 04/14/2016 Document Reviewed: 03/06/2016 Elsevier Interactive Patient Education  2018 Elsevier Inc.  

## 2018-03-20 LAB — COMPREHENSIVE METABOLIC PANEL
ALBUMIN: 4.7 g/dL (ref 3.5–5.5)
ALK PHOS: 87 IU/L (ref 39–117)
ALT: 27 IU/L (ref 0–44)
AST: 19 IU/L (ref 0–40)
Albumin/Globulin Ratio: 1.9 (ref 1.2–2.2)
BILIRUBIN TOTAL: 0.3 mg/dL (ref 0.0–1.2)
BUN / CREAT RATIO: 11 (ref 9–20)
BUN: 11 mg/dL (ref 6–24)
CHLORIDE: 97 mmol/L (ref 96–106)
CO2: 23 mmol/L (ref 20–29)
CREATININE: 1.04 mg/dL (ref 0.76–1.27)
Calcium: 10 mg/dL (ref 8.7–10.2)
GFR calc non Af Amer: 79 mL/min/{1.73_m2} (ref >59)
GFR, EST AFRICAN AMERICAN: 91 mL/min/{1.73_m2} (ref >59)
GLOBULIN, TOTAL: 2.5 g/dL (ref 1.5–4.5)
Glucose: 207 mg/dL — ABNORMAL HIGH (ref 65–99)
Potassium: 4 mmol/L (ref 3.5–5.2)
SODIUM: 138 mmol/L (ref 134–144)
TOTAL PROTEIN: 7.2 g/dL (ref 6.0–8.5)

## 2018-03-20 LAB — PSA: PROSTATE SPECIFIC AG, SERUM: 1.6 ng/mL (ref 0.0–4.0)

## 2018-03-29 DIAGNOSIS — K080 Exfoliation of teeth due to systemic causes: Secondary | ICD-10-CM | POA: Diagnosis not present

## 2018-05-15 ENCOUNTER — Ambulatory Visit: Payer: Federal, State, Local not specified - PPO | Admitting: Podiatry

## 2018-05-15 ENCOUNTER — Encounter: Payer: Self-pay | Admitting: Podiatry

## 2018-05-15 DIAGNOSIS — M79676 Pain in unspecified toe(s): Secondary | ICD-10-CM

## 2018-05-15 DIAGNOSIS — E1159 Type 2 diabetes mellitus with other circulatory complications: Secondary | ICD-10-CM

## 2018-05-15 DIAGNOSIS — B351 Tinea unguium: Secondary | ICD-10-CM | POA: Diagnosis not present

## 2018-05-15 NOTE — Progress Notes (Signed)
Complaint:  Visit Type: Patient returns to my office for continued preventative foot care services. Complaint: Patient states" my nails have grown long and thick and become painful to walk and wear shoes" Patient has been diagnosed with DM with no foot complications. The patient presents for preventative foot care services. No changes to ROS  Podiatric Exam: Vascular: dorsalis pedis and posterior tibial pulses are not  palpable bilateral. Capillary return is immediate. Cold feet.. Skin turgor WNL  Sensorium: Diminished  Semmes Weinstein monofilament test left foot.  LOPS WNL right foot. Normal tactile sensation bilaterally. Nail Exam: Pt has thick disfigured discolored nails with subungual debris noted bilateral entire nail hallux through fifth toenails Ulcer Exam: There is no evidence of ulcer or pre-ulcerative changes or infection. Orthopedic Exam: Muscle tone and strength are WNL. No limitations in general ROM. No crepitus or effusions noted. Foot type and digits show no abnormalities. Bony prominences are unremarkable. Skin: No Porokeratosis. No infection or ulcers  Diagnosis:  Onychomycosis, , Pain in right toe, pain in left toes Diabetes with angiopathy  Treatment & Plan Procedures and Treatment: Consent by patient was obtained for treatment procedures. The patient understood the discussion of treatment and procedures well. All questions were answered thoroughly reviewed. Debridement of mycotic and hypertrophic toenails, 1 through 5 bilateral and clearing of subungual debris. No ulceration, no infection noted.  Return Visit-Office Procedure: Patient instructed to return to the office for a follow up visit 3 months for continued evaluation and treatment.    Tondra Reierson DPM 

## 2018-05-21 ENCOUNTER — Other Ambulatory Visit: Payer: Self-pay | Admitting: Family Medicine

## 2018-05-21 DIAGNOSIS — E1142 Type 2 diabetes mellitus with diabetic polyneuropathy: Secondary | ICD-10-CM

## 2018-06-12 ENCOUNTER — Other Ambulatory Visit: Payer: Self-pay | Admitting: Family Medicine

## 2018-06-12 DIAGNOSIS — I10 Essential (primary) hypertension: Secondary | ICD-10-CM

## 2018-06-20 ENCOUNTER — Telehealth: Payer: Self-pay | Admitting: Family Medicine

## 2018-06-20 NOTE — Telephone Encounter (Signed)
Called pt. To reschedule visit on  07/18/18. Rescheduled with patient

## 2018-07-18 ENCOUNTER — Ambulatory Visit: Payer: Federal, State, Local not specified - PPO | Admitting: Family Medicine

## 2018-07-19 ENCOUNTER — Ambulatory Visit: Payer: Federal, State, Local not specified - PPO | Admitting: Family Medicine

## 2018-07-19 ENCOUNTER — Encounter: Payer: Self-pay | Admitting: Family Medicine

## 2018-07-19 VITALS — BP 140/80 | HR 96 | Temp 98.2°F | Resp 18 | Ht 67.5 in

## 2018-07-19 DIAGNOSIS — E1142 Type 2 diabetes mellitus with diabetic polyneuropathy: Secondary | ICD-10-CM | POA: Diagnosis not present

## 2018-07-19 DIAGNOSIS — E890 Postprocedural hypothyroidism: Secondary | ICD-10-CM

## 2018-07-19 DIAGNOSIS — I10 Essential (primary) hypertension: Secondary | ICD-10-CM

## 2018-07-19 DIAGNOSIS — E785 Hyperlipidemia, unspecified: Secondary | ICD-10-CM

## 2018-07-19 DIAGNOSIS — E113299 Type 2 diabetes mellitus with mild nonproliferative diabetic retinopathy without macular edema, unspecified eye: Secondary | ICD-10-CM | POA: Diagnosis not present

## 2018-07-19 DIAGNOSIS — Z23 Encounter for immunization: Secondary | ICD-10-CM

## 2018-07-19 LAB — POCT GLYCOSYLATED HEMOGLOBIN (HGB A1C): Hemoglobin A1C: 7.7 % — AB (ref 4.0–5.6)

## 2018-07-19 MED ORDER — ATORVASTATIN CALCIUM 40 MG PO TABS: 40.0000 mg | ORAL_TABLET | Freq: Every day | ORAL | 3 refills | Status: DC

## 2018-07-19 MED ORDER — METFORMIN HCL 1000 MG PO TABS: 1000.0000 mg | ORAL_TABLET | Freq: Two times a day (BID) | ORAL | 1 refills | Status: DC

## 2018-07-19 MED ORDER — GLIMEPIRIDE 4 MG PO TABS: ORAL_TABLET | ORAL | 1 refills | Status: DC

## 2018-07-19 MED ORDER — LISINOPRIL-HYDROCHLOROTHIAZIDE 20-25 MG PO TABS: 1.0000 | ORAL_TABLET | Freq: Every day | ORAL | 1 refills | Status: DC

## 2018-07-19 MED ORDER — LEVOTHYROXINE SODIUM 125 MCG PO TABS: ORAL_TABLET | ORAL | 1 refills | Status: DC

## 2018-07-19 MED ORDER — METOPROLOL SUCCINATE ER 100 MG PO TB24: ORAL_TABLET | ORAL | 1 refills | Status: DC

## 2018-07-19 NOTE — Patient Instructions (Addendum)
Influenza Virus Vaccine (Flucelvax) What is this medicine? INFLUENZA VIRUS VACCINE (in floo EN zuh VAHY ruhs vak SEEN) helps to reduce the risk of getting influenza also known as the flu. The vaccine only helps protect you against some strains of the flu. This medicine may be used for other purposes; ask your health care provider or pharmacist if you have questions. COMMON BRAND NAME(S): FLUCELVAX What should I tell my health care provider before I take this medicine? They need to know if you have any of these conditions: -bleeding disorder like hemophilia -fever or infection -Guillain-Barre syndrome or other neurological problems -immune system problems -infection with the human immunodeficiency virus (HIV) or AIDS -low blood platelet counts -multiple sclerosis -an unusual or allergic reaction to influenza virus vaccine, other medicines, foods, dyes or preservatives -pregnant or trying to get pregnant -breast-feeding How should I use this medicine? This vaccine is for injection into a muscle. It is given by a health care professional. A copy of Vaccine Information Statements will be given before each vaccination. Read this sheet carefully each time. The sheet may change frequently. Talk to your pediatrician regarding the use of this medicine in children. Special care may be needed. Overdosage: If you think you've taken too much of this medicine contact a poison control center or emergency room at once. Overdosage: If you think you have taken too much of this medicine contact a poison control center or emergency room at once. NOTE: This medicine is only for you. Do not share this medicine with others. What if I miss a dose? This does not apply. What may interact with this medicine? -chemotherapy or radiation therapy -medicines that lower your immune system like etanercept, anakinra, infliximab, and adalimumab -medicines that treat or prevent blood clots like  warfarin -phenytoin -steroid medicines like prednisone or cortisone -theophylline -vaccines This list may not describe all possible interactions. Give your health care provider a list of all the medicines, herbs, non-prescription drugs, or dietary supplements you use. Also tell them if you smoke, drink alcohol, or use illegal drugs. Some items may interact with your medicine. What should I watch for while using this medicine? Report any side effects that do not go away within 3 days to your doctor or health care professional. Call your health care provider if any unusual symptoms occur within 6 weeks of receiving this vaccine. You may still catch the flu, but the illness is not usually as bad. You cannot get the flu from the vaccine. The vaccine will not protect against colds or other illnesses that may cause fever. The vaccine is needed every year. What side effects may I notice from receiving this medicine? Side effects that you should report to your doctor or health care professional as soon as possible: -allergic reactions like skin rash, itching or hives, swelling of the face, lips, or tongue Side effects that usually do not require medical attention (Report these to your doctor or health care professional if they continue or are bothersome.): -fever -headache -muscle aches and pains -pain, tenderness, redness, or swelling at the injection site -tiredness This list may not describe all possible side effects. Call your doctor for medical advice about side effects. You may report side effects to FDA at 1-800-FDA-1088. Where should I keep my medicine? The vaccine will be given by a health care professional in a clinic, pharmacy, doctor's office, or other health care setting. You will not be given vaccine doses to store at home. NOTE: This sheet is a  summary. It may not cover all possible information. If you have questions about this medicine, talk to your doctor, pharmacist, or health care  provider.  2018 Elsevier/Gold Standard (2011-09-06 14:06:47)   If you have lab work done today you will be contacted with your lab results within the next 2 weeks.  If you have not heard from [place] then please contact us. The fastest way to get your results is to register for My Chart.   IF you received an x-ray today, you will receive an invoice from [facility]. Please contact [facility] at [phone] with questions or concerns regarding your invoice.   IF you received labwork today, you will receive an invoice from [facility]. Please contact [facility] at [phone] with questions or concerns regarding your invoice.   Our billing staff will not be able to assist you with questions regarding bills from these companies.  You will be contacted with the lab results as soon as they are available. The fastest way to get your results is to activate your My Chart account. Instructions are located on the last page of this paperwork. If you have not heard from [place] regarding the results in 2 weeks, please contact this office.      Diabetes Mellitus and Nutrition When you have diabetes (diabetes mellitus), it is very important to have healthy eating habits because your blood sugar (glucose) levels are greatly affected by what you eat and drink. Eating healthy foods in the appropriate amounts, at about the same times every day, can help you:  Control your blood glucose.  Lower your risk of heart disease.  Improve your blood pressure.  Reach or maintain a healthy weight.  Every person with diabetes is different, and each person has different needs for a meal plan. Your health care provider may recommend that you work with a diet and nutrition specialist (dietitian) to make a meal plan that is best for you. Your meal plan may vary depending on factors such as:  The calories you need.  The medicines you take.  Your weight.  Your blood glucose, blood pressure, and cholesterol  levels.  Your activity level.  Other health conditions you have, such as heart or kidney disease.  How do carbohydrates affect me? Carbohydrates affect your blood glucose level more than any other type of food. Eating carbohydrates naturally increases the amount of glucose in your blood. Carbohydrate counting is a method for keeping track of how many carbohydrates you eat. Counting carbohydrates is important to keep your blood glucose at a healthy level, especially if you use insulin or take certain oral diabetes medicines. It is important to know how many carbohydrates you can safely have in each meal. This is different for every person. Your dietitian can help you calculate how many carbohydrates you should have at each meal and for snack. Foods that contain carbohydrates include:  Bread, cereal, rice, pasta, and crackers.  Potatoes and corn.  Peas, beans, and lentils.  Milk and yogurt.  Fruit and juice.  Desserts, such as cakes, cookies, ice cream, and candy.  How does alcohol affect me? Alcohol can cause a sudden decrease in blood glucose (hypoglycemia), especially if you use insulin or take certain oral diabetes medicines. Hypoglycemia can be a life-threatening condition. Symptoms of hypoglycemia (sleepiness, dizziness, and confusion) are similar to symptoms of having too much alcohol. If your health care provider says that alcohol is safe for you, follow these guidelines:  Limit alcohol intake to no more than 1 drink  per day for nonpregnant women and 2 drinks per day for men. One drink equals 12 oz of beer, 5 oz of wine, or 1 oz of hard liquor.  Do not drink on an empty stomach.  Keep yourself hydrated with water, diet soda, or unsweetened iced tea.  Keep in mind that regular soda, juice, and other mixers may contain a lot of sugar and must be counted as carbohydrates.  What are tips for following this plan? Reading food labels  Start by checking the serving size on the  label. The amount of calories, carbohydrates, fats, and other nutrients listed on the label are based on one serving of the food. Many foods contain more than one serving per package.  Check the total grams (g) of carbohydrates in one serving. You can calculate the number of servings of carbohydrates in one serving by dividing the total carbohydrates by 15. For example, if a food has 30 g of total carbohydrates, it would be equal to 2 servings of carbohydrates.  Check the number of grams (g) of saturated and trans fats in one serving. Choose foods that have low or no amount of these fats.  Check the number of milligrams (mg) of sodium in one serving. Most people should limit total sodium intake to less than 2,300 mg per day.  Always check the nutrition information of foods labeled as "low-fat" or "nonfat". These foods may be higher in added sugar or refined carbohydrates and should be avoided.  Talk to your dietitian to identify your daily goals for nutrients listed on the label. Shopping  Avoid buying canned, premade, or processed foods. These foods tend to be high in fat, sodium, and added sugar.  Shop around the outside edge of the grocery store. This includes fresh fruits and vegetables, bulk grains, fresh meats, and fresh dairy. Cooking  Use low-heat cooking methods, such as baking, instead of high-heat cooking methods like deep frying.  Cook using healthy oils, such as olive, canola, or sunflower oil.  Avoid cooking with butter, cream, or high-fat meats. Meal planning  Eat meals and snacks regularly, preferably at the same times every day. Avoid going long periods of time without eating.  Eat foods high in fiber, such as fresh fruits, vegetables, beans, and whole grains. Talk to your dietitian about how many servings of carbohydrates you can eat at each meal.  Eat 4-6 ounces of lean protein each day, such as lean meat, chicken, fish, eggs, or tofu. 1 ounce is equal to 1 ounce of  meat, chicken, or fish, 1 egg, or 1/4 cup of tofu.  Eat some foods each day that contain healthy fats, such as avocado, nuts, seeds, and fish. Lifestyle   Check your blood glucose regularly.  Exercise at least 30 minutes 5 or more days each week, or as told by your health care provider.  Take medicines as told by your health care provider.  Do not use any products that contain nicotine or tobacco, such as cigarettes and e-cigarettes. If you need help quitting, ask your health care provider.  Work with a Veterinary surgeon or diabetes educator to identify strategies to manage stress and any emotional and social challenges. What are some questions to ask my health care provider?  Do I need to meet with a diabetes educator?  Do I need to meet with a dietitian?  What number can I call if I have questions?  When are the best times to check my blood glucose? Where to find more information:  American Diabetes Association: diabetes.org/food-and-fitness/food  Academy of Nutrition and Dietetics: https://www.vargas.com/  [employer] of Diabetes and Digestive and Kidney Diseases (NIH): FindJewelers.cz Summary  A healthy meal plan will help you control your blood glucose and maintain a healthy lifestyle.  Working with a diet and nutrition specialist (dietitian) can help you make a meal plan that is best for you.  Keep in mind that carbohydrates and alcohol have immediate effects on your blood glucose levels. It is important to count carbohydrates and to use alcohol carefully. This information is not intended to replace advice given to you by your health care provider. Make sure you discuss any questions you have with your health care provider. Document Released: 06/22/2005 Document Revised: 10/30/2016 Document Reviewed: 10/30/2016 Elsevier Interactive Patient Education  [employer].

## 2018-07-19 NOTE — Progress Notes (Signed)
10/11/20198:54 AM  Randy Ballard 1961-07-31, 57 y.o. male [id number]  Chief Complaint  Patient presents with  . Diabetes    recheck on his diabetes  . Medication Refill    states he is not out but wants to have some on file  . Flu Vaccine    HPI:   Patient is a 57 y.o. male with past medical history significant for DM2, HTN, HLP, GERD, spinocerebellar ataxia 3, hypothyroidism who presents today for routine followup  PCP Dr Brigitte Pulse Last appt June 2019  Checks sugars intermittently Last check was about 4 days, evening, 165 Takes metformin 1057m BID Takes glimperide 860mevery morning Denies any low sugars H/o pancreatitis Needs referral for new eye doctor [facility] a [facility]  Reports he took his BP meds this morning Reports BP always high when he comes in  Uses a walker  Has cut back out significantly on smoking 1/2 pdd a day, he does not smoke in the car, etc anymore  Lab Results  Component Value Date   HGBA1C 8.6 (A) 03/19/2018   HGBA1C 7.6 10/25/2017   HGBA1C 7.7 06/20/2017   Lab Results  Component Value Date   MICROALBUR 8.9 05/25/2016   LDLCALC 62 06/20/2017   CREATININE 1.04 03/19/2018    Fall Risk  07/19/2018 10/25/2017 06/20/2017 01/12/2017 01/08/2017  Falls in the past year? Yes Yes Yes No Yes  Comment - - - - -  Number falls in past yr: _0 - -  Comment - - - - -  Injury with Fall? (No Data) Yes Yes - -  Comment he hurt his right knee bruised rib and cut to face - - -  Risk for fall due to : - - - - -     Dep[place] screen [place]9 07/19/2018 03/19/2018 10/25/2017  Decreased Interest 0 0 0  Down, Depressed, Hopeless 0 0 0  PHQ - 2 Score 0 0 0    Allergies  Allergen Reactions  . Januvia [Sitagliptin] Nausea And Vomiting    pancreatitis    Prior to Admission medications   Medication Sig Start Date End Date Taking? Authorizing Provider  aspirin 81 MG tablet Take 1 tablet (81 mg total) by  mouth daily. 12/23/11  Yes RiHayden RasmussenMD  atorvastatin (LIPITOR) 40 MG tablet Take 1 tablet (40 mg total) by mouth daily. 10/25/17  Yes ShShawnee KnappMD  Blood Glucose Monitoring Suppl (BLOOD GLUCOSE METER) kit Test blood sugar daily 12/19/13  Yes ShShawnee KnappMD  calcium-vitamin D (OSCAL WITH D) 500-200 MG-UNIT per tablet Take 1 tablet by mouth 2 (two) times daily.   Yes [provider]  Cholecalciferol (VITAMIN D3) 5000 units CAPS Take 1 capsule (5,000 Units total) by mouth daily. 10/25/17  Yes ShShawnee KnappMD  glimepiride (AMARYL) 4 MG tablet TAKE 2 TABLETS BY MOUTH ONCE DAILY WITH BREAKFAST 05/21/18  Yes ShShawnee KnappMD  glucose blood test strip Test blood sugar daily. 12/19/13  Yes ShShawnee KnappMD  Lancet Devices (LANCING DEVICE) MISC Test blood sugar daily. 12/19/13  Yes ShShawnee KnappMD  levothyroxine (SYNTHROID, LEVOTHROID) 125 MCG tablet TAKE 1 TABLET BY MOUTH ONCE DAILY BEFORE BREAKFAST 06/12/18  Yes SaRutherford GuysMD  lisinopril-hydrochlorothiazide (PRINZIDE,ZESTORETIC) 20-25 MG tablet TAKE 1 TABLET BY MOUTH ONCE DAILY 06/12/18  Yes SaRutherford GuysMD  metFORMIN (GLUCOPHAGE) 1000 MG tablet Take 1 tablet (1,000 mg  total) by mouth 2 (two) times daily with a meal. 10/25/17  Yes Shawnee Knapp, MD  metoprolol succinate (TOPROL-XL) 100 MG 24 hr tablet TAKE 1 TABLET BY MOUTH ONCE DAILY WITH OR IMMEDIATELY FOLLOWING A MEAL 06/12/18  Yes Rutherford Guys, MD  ranitidine (ZANTAC) 150 MG tablet Take 1 tablet (150 mg total) by mouth 2 (two) times daily as needed for heartburn. 05/25/16  Yes Shawnee Knapp, MD    Past Medical History:  Diagnosis Date  . Diabetes mellitus without complication ([place])   . Diverticulitis   . Hyperlipidemia   . Hypertension   . Hypogonadism male   . Neuromuscular disorder ([place])   . SCA-3 (spinocerebellar ataxia type 3) ([place])   . Thyroid disease 02/06/2009   [place] s/p RAIU; cold nodule s/p negative biopsy    Past Surgical History:  Procedure Laterality Date  .  BIOPSY THYROID  02/06/2009   cold thyroid nodule; negative/benign.  . COLONOSCOPY W/ POLYPECTOMY  Feb 2014   5 polyps  . TOOTH EXTRACTION  10/2015   x 4 teeth    Social History   Tobacco Use  . Smoking status: Current Some Day Smoker    Packs/day: 0.50    Years: 32.00    Pack years: 16.00    Types: Cigarettes  . Smokeless tobacco: Never Used  . Tobacco comment: 10/26/15 trying to quit  Substance Use Topics  . Alcohol use: No    Alcohol/week: 0.0 standard drinks    Family History  Adopted: Yes  Problem Relation Age of Onset  . Ataxia Mother 76       SCA3  . Ataxia Maternal Grandmother        SCA3  . Ataxia Maternal Aunt        SCA3    Review of Systems  Constitutional: Negative for chills and fever.  Respiratory: Negative for cough and shortness of breath.   Cardiovascular: Negative for chest pain, palpitations and leg swelling.  Gastrointestinal: Negative for abdominal pain, nausea and vomiting.     OBJECTIVE:  Blood pressure (!) 164/68, pulse 96, temperature 98.2 F (36.8 C), temperature source Oral, resp. rate 18, height 5' 7.5" (1.715 m), SpO2 100 %. Body mass index is 33.58 kg/m.   Wt Readings from Last 3 Encounters:  03/19/18 217 lb 9.6 oz (98.7 kg)  10/25/17 217 lb 12.8 oz (98.8 kg)  06/20/17 221 lb (100.2 kg)    BP Readings from Last 3 Encounters:  07/19/18 (!) 164/68  03/19/18 124/70  10/25/17 136/66   Repeat BP 140/80  Physical Exam  Constitutional: He is oriented to person, place, and time. He appears well-developed and well-nourished.  HENT:  Head: Normocephalic and atraumatic.  Mouth/Throat: Oropharynx is clear and moist.  Eyes: Pupils are equal, round, and reactive to light. Conjunctivae and EOM are normal.  Neck: Neck supple.  Cardiovascular: Normal rate and regular rhythm. Exam reveals no gallop and no friction rub.  No murmur heard. Pulmonary/Chest: Effort normal and breath sounds normal. He has no wheezes. He has no rales.    Musculoskeletal: He exhibits no edema.  Neurological: He is alert and oriented to person, place, and time.  Skin: Skin is warm and dry.  Psychiatric: He has a normal mood and affect.  Nursing note and vitals reviewed.   Results for orders placed or performed in visit on 07/19/18 (from the past 24 hour(s))  POCT glycosylated hemoglobin (Hb A1C)     Status: Abnormal   Collection Time: 07/19/18  9:27 AM  Result Value Ref Range   Hemoglobin A1C 7.7 (A) 4.0 - 5.6 %   HbA1c POC (<> result, manual entry)     HbA1c, POC (prediabetic range)     HbA1c, POC (controlled diabetic range)        ASSESSMENT and PLAN  1. Type 2 diabetes mellitus with diabetic polyneuropathy, without long-term current use of insulin (HCC) Improved, continue working on LFM. No changes to meds at this time - Comprehensive metabolic panel - POCT glycosylated hemoglobin (Hb A1C) - Microalbumin/Creatinine Ratio, Urine - Ambulatory referral to Ophthalmology - glimepiride (AMARYL) 4 MG tablet; TAKE 2 TABLETS BY MOUTH ONCE DAILY WITH BREAKFAST - metFORMIN (GLUCOPHAGE) 1000 MG tablet; Take 1 tablet (1,000 mg total) by mouth 2 (two) times daily with a meal.  2. Flu vaccine need - Flu Vaccine QUAD 36+ mos IM  3. Postablative hypothyroidism Checking labs today, medications will be adjusted as needed.  - TSH  4. Hyperlipidemia LDL goal <100 Checking labs today, medications will be adjusted as needed.  - Lipid panel - atorvastatin (LIPITOR) 40 MG tablet; Take 1 tablet (40 mg total) by mouth daily.  5. Essential hypertension, benign Controlled. Continue current regime.  - Care order/instruction:  Other orders - levothyroxine (SYNTHROID, LEVOTHROID) 125 MCG tablet; TAKE 1 TABLET BY MOUTH ONCE DAILY BEFORE BREAKFAST - metoprolol succinate (TOPROL-XL) 100 MG 24 hr tablet; TAKE 1 TABLET BY MOUTH ONCE DAILY WITH OR IMMEDIATELY FOLLOWING A MEAL    Return in about 3 months (around 10/19/2018) for Dr Brigitte Pulse, chronic medical  conditions.    Rutherford Guys, MD Primary Care at [place], [place] [address] Ph.  [phone] Fax [phone]

## 2018-07-20 LAB — COMPREHENSIVE METABOLIC PANEL
ALT: 36 IU/L (ref 0–44)
AST: 29 IU/L (ref 0–40)
Albumin/Globulin Ratio: 2 (ref 1.2–2.2)
Albumin: 4.9 g/dL (ref 3.5–5.5)
Alkaline Phosphatase: 79 IU/L (ref 39–117)
BUN/Creatinine Ratio: 11 (ref 9–20)
BUN: 11 mg/dL (ref 6–24)
Bilirubin Total: 0.3 mg/dL (ref 0.0–1.2)
CO2: 22 mmol/L (ref 20–29)
Calcium: 9.7 mg/dL (ref 8.7–10.2)
Chloride: 98 mmol/L (ref 96–106)
Creatinine, Ser: 0.98 mg/dL (ref 0.76–1.27)
GFR calc Af Amer: 98 mL/min/{1.73_m2} (ref >59)
GFR calc non Af Amer: 85 mL/min/{1.73_m2} (ref >59)
Globulin, Total: 2.4 g/dL (ref 1.5–4.5)
Glucose: 122 mg/dL — ABNORMAL HIGH (ref 65–99)
Potassium: 4.3 mmol/L (ref 3.5–5.2)
Sodium: 139 mmol/L (ref 134–144)
Total Protein: 7.3 g/dL (ref 6.0–8.5)

## 2018-07-20 LAB — LIPID PANEL
Chol/HDL Ratio: 3.7 ratio (ref 0.0–5.0)
Cholesterol, Total: 115 mg/dL (ref 100–199)
HDL: 31 mg/dL — ABNORMAL LOW (ref >39)
LDL Calculated: 50 mg/dL (ref 0–99)
Triglycerides: 170 mg/dL — ABNORMAL HIGH (ref 0–149)
VLDL Cholesterol Cal: 34 mg/dL (ref 5–40)

## 2018-07-20 LAB — MICROALBUMIN / CREATININE URINE RATIO
Creatinine, Urine: 118.4 mg/dL
Microalb/Creat Ratio: 45.3 mg/g creat — ABNORMAL HIGH (ref 0.0–30.0)
Microalbumin, Urine: 53.6 ug/mL

## 2018-07-20 LAB — TSH: TSH: 4.08 u[IU]/mL (ref 0.450–4.500)

## 2018-08-14 ENCOUNTER — Ambulatory Visit: Payer: Federal, State, Local not specified - PPO | Admitting: Podiatry

## 2018-08-14 ENCOUNTER — Encounter: Payer: Self-pay | Admitting: Podiatry

## 2018-08-14 DIAGNOSIS — B351 Tinea unguium: Secondary | ICD-10-CM | POA: Diagnosis not present

## 2018-08-14 DIAGNOSIS — E1159 Type 2 diabetes mellitus with other circulatory complications: Secondary | ICD-10-CM | POA: Diagnosis not present

## 2018-08-14 DIAGNOSIS — M79676 Pain in unspecified toe(s): Secondary | ICD-10-CM

## 2018-08-14 NOTE — Progress Notes (Signed)
Complaint:  Visit Type: Patient returns to my office for continued preventative foot care services. Complaint: Patient states" my nails have grown long and thick and become painful to walk and wear shoes" Patient has been diagnosed with DM with no foot complications. The patient presents for preventative foot care services. No changes to ROS  Podiatric Exam: Vascular: dorsalis pedis and posterior tibial pulses are not  palpable bilateral. Capillary return is immediate. Cold feet.. Skin turgor WNL  Sensorium: Diminished  Semmes Weinstein monofilament test left foot.  LOPS WNL right foot. Normal tactile sensation bilaterally. Nail Exam: Pt has thick disfigured discolored nails with subungual debris noted bilateral entire nail hallux through fifth toenails Ulcer Exam: There is no evidence of ulcer or pre-ulcerative changes or infection. Orthopedic Exam: Muscle tone and strength are WNL. No limitations in general ROM. No crepitus or effusions noted. Foot type and digits show no abnormalities. Bony prominences are unremarkable. Skin: No Porokeratosis. No infection or ulcers  Diagnosis:  Onychomycosis, , Pain in right toe, pain in left toes Diabetes with angiopathy  Treatment & Plan Procedures and Treatment: Consent by patient was obtained for treatment procedures. The patient understood the discussion of treatment and procedures well. All questions were answered thoroughly reviewed. Debridement of mycotic and hypertrophic toenails, 1 through 5 bilateral and clearing of subungual debris. No ulceration, no infection noted.  Return Visit-Office Procedure: Patient instructed to return to the office for a follow up visit 3 months for continued evaluation and treatment.    Rody Keadle DPM 

## 2018-08-20 ENCOUNTER — Other Ambulatory Visit: Payer: Self-pay | Admitting: Family Medicine

## 2018-08-20 DIAGNOSIS — E1142 Type 2 diabetes mellitus with diabetic polyneuropathy: Secondary | ICD-10-CM

## 2018-08-20 NOTE — Telephone Encounter (Signed)
Requested Prescriptions  Pending Prescriptions Disp Refills  . glimepiride (AMARYL) 4 MG tablet [Pharmacy Med Name: GLIMEPIRIDE 4MG  TABLETS] 180 tablet 0    Sig: TAKE 2 TABLETS BY MOUTH ONCE DAILY WITH BREAKFAST     Endocrinology:  Diabetes - Sulfonylureas Passed - 08/20/2018 12:38 PM      Passed - HBA1C is between 0 and 7.9 and within 180 days    Hemoglobin A1C  Date Value Ref Range Status  07/19/2018 7.7 (A) 4.0 - 5.6 % Final   Hgb A1c MFr Bld  Date Value Ref Range Status  04/16/2013 8.4 (H) <5.7 % Final    Comment:    (NOTE)                                                                       According to the ADA Clinical Practice Recommendations for 2011, when HbA1c is used as a screening test:  >=6.5%   Diagnostic of Diabetes Mellitus           (if abnormal result is confirmed) 5.7-6.4%   Increased risk of developing Diabetes Mellitus References:Diagnosis and Classification of Diabetes Mellitus,Diabetes Care,2011,34(Suppl 1):S62-S69 and Standards of Medical Care in         Diabetes - 2011,Diabetes Care,2011,34 (Suppl 1):S11-S61.         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, without long-term current use of insulin, macular edema presence unspecified, unspecified laterality [facility])   [facility] at [facility], Levell July, MD   1 year ago 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

## 2018-08-25 ENCOUNTER — Other Ambulatory Visit: Payer: Self-pay | Admitting: Family Medicine

## 2018-08-25 DIAGNOSIS — E1142 Type 2 diabetes mellitus with diabetic polyneuropathy: Secondary | ICD-10-CM

## 2018-08-26 NOTE — Telephone Encounter (Signed)
[facility] called and spoke to [place] about refill request. She says they already have it and she will delete this request.

## 2018-09-17 ENCOUNTER — Telehealth: Payer: Self-pay | Admitting: Family Medicine

## 2018-09-17 IMAGING — DX DG CHEST 2V
2 series · 2 of 2 positions shown · non-contrast
Comparison: None.

CLINICAL DATA: Dyspnea on exertion in a cigarette smoker.

EXAM:
CHEST - 2 VIEW

[chest pa]
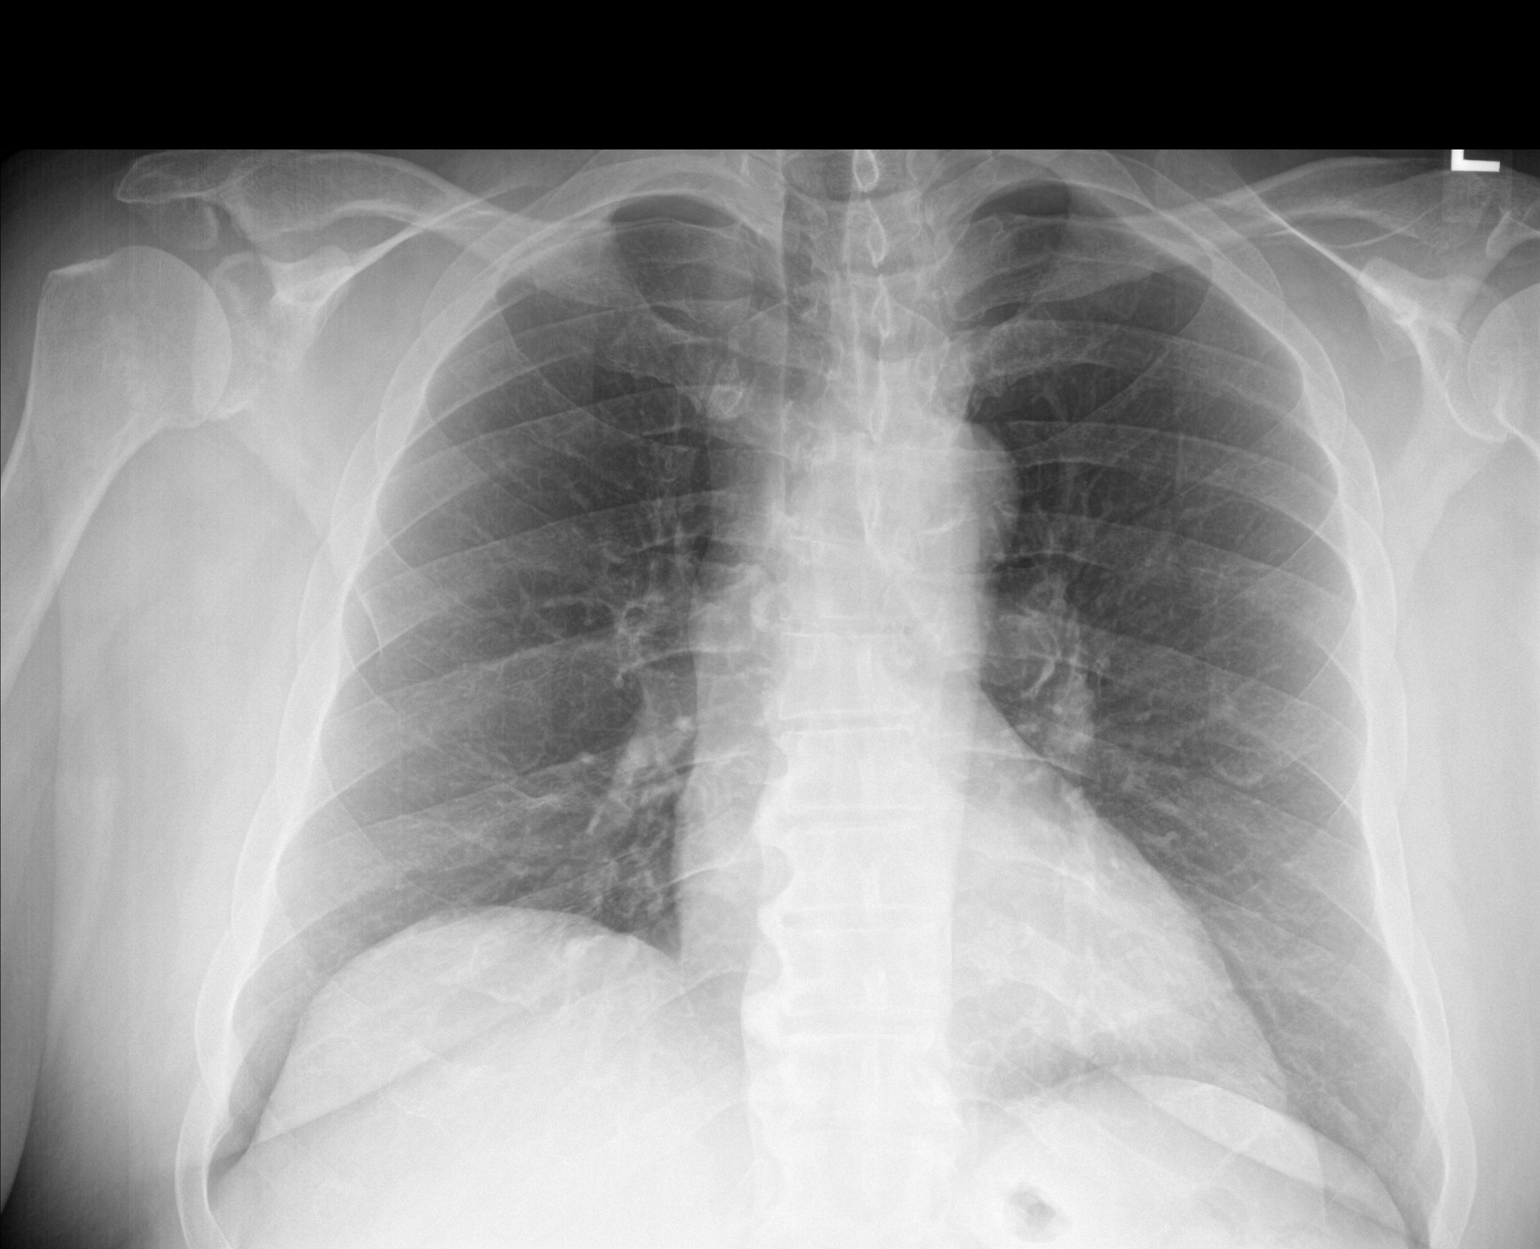

[chest lat]
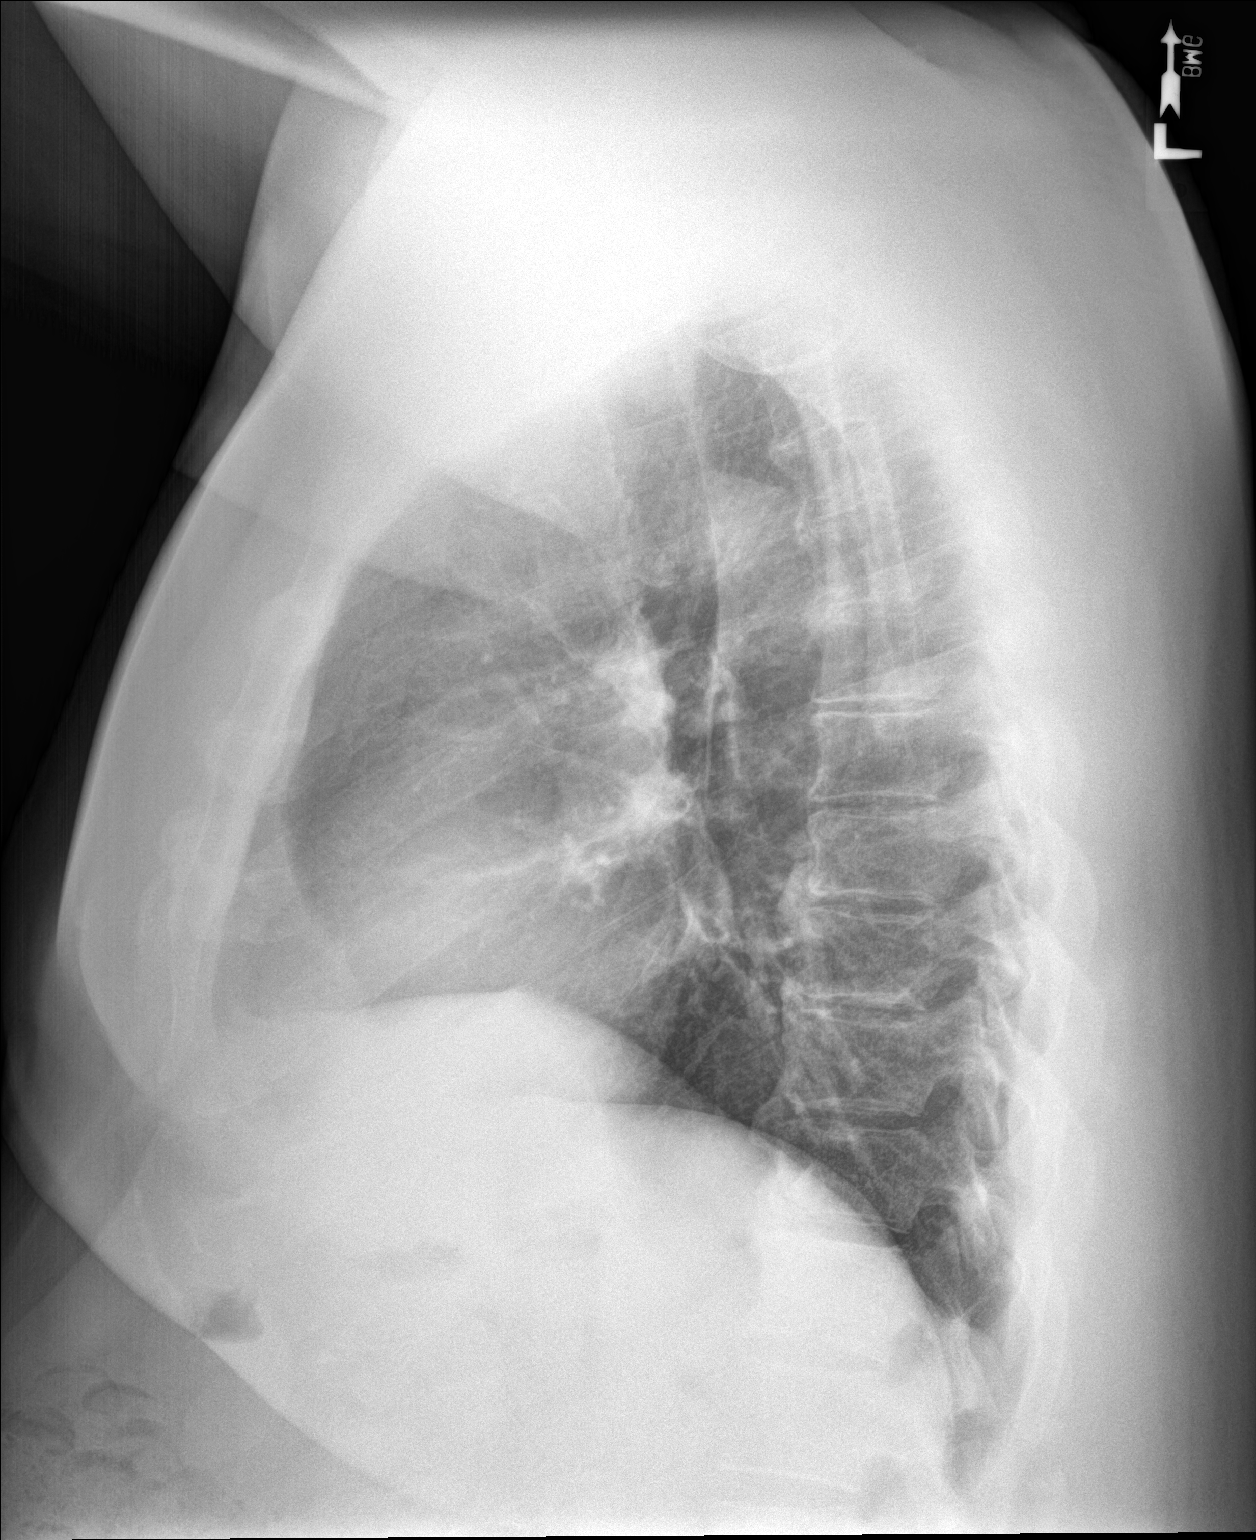

[2 of 2 positions shown; findings below may reference images not displayed]

FINDINGS: The lungs are clear. Heart size is normal. No pneumothorax or
pleural effusion. Aortic atherosclerosis is noted. No acute bony
abnormality.
IMPRESSION: No acute disease.

Atherosclerosis.

## 2018-09-17 NOTE — Telephone Encounter (Signed)
Copied from CRM [id number]#[id number]. Topic: Quick Communication - Rx Refill/Question >> Sep 17, 2018 12:23 PM Wyonia HoughJohnson, Chaz E wrote: Medication: lisinopril-hydrochlorothiazide (PRINZIDE,ZESTORETIC) 20-25 MG tablet   Has the patient contacted their pharmacy? Yes.   Pt stated that the Pharmacy advised that they had sent two request during the week of Dec 2nd and request were denied   Pt is out of medication   Preferred Pharmacy (with phone number or street name): [id number] [address] Otto- [place], [place] - [phone] [facility] [address] [phone] (Phone) [phone] (Fax)

## 2018-09-20 NOTE — Telephone Encounter (Signed)
Patient was called and a voicemail was left stating a refill of his medication was already sent to the pharmacy back on October 2019 with a refill.  Patient was advised to contact pharmacy for any issues if have not picked up medication.

## 2018-09-23 ENCOUNTER — Other Ambulatory Visit: Payer: Self-pay | Admitting: Family Medicine

## 2018-09-23 DIAGNOSIS — I10 Essential (primary) hypertension: Secondary | ICD-10-CM

## 2018-09-24 NOTE — Telephone Encounter (Signed)
Requested Prescriptions  Pending Prescriptions Disp Refills  . lisinopril-hydrochlorothiazide (PRINZIDE,ZESTORETIC) 20-25 MG tablet [Pharmacy Med Name: LISINOPRIL-HCTZ 20/25MG  TABLETS] 60 tablet 0    Sig: TAKE 1 TABLET BY MOUTH EVERY DAY     Cardiovascular:  ACEI + Diuretic Combos Failed - 09/23/2018  5:19 PM      Failed - Last BP in normal range    BP Readings from Last 1 Encounters:  07/19/18 140/80         Passed - Na in normal range and within 180 days    Sodium  Date Value Ref Range Status  07/19/2018 139 134 - 144 mmol/L Final         Passed - K in normal range and within 180 days    Potassium  Date Value Ref Range Status  07/19/2018 4.3 3.5 - 5.2 mmol/L Final         Passed - Cr in normal range and within 180 days    Creat  Date Value Ref Range Status  05/25/2016 0.99 0.70 - 1.33 mg/dL Final    Comment:      For patients > or = 58 years of age: The upper reference limit for Creatinine is approximately 13% higher for people identified as African-American.      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, without long-term current use of insulin, macular edema presence unspecified, unspecified laterality [facility](HCC)   [facility], Levell JulyEva N, MD   11 months ago 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, without long-term current use of insulin, macular edema presence unspecified, unspecified  laterality [facility](HCC)   [facility], Levell JulyEva N, MD   1 year ago 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

## 2018-10-25 ENCOUNTER — Telehealth: Payer: Self-pay | Admitting: Family Medicine

## 2018-10-25 NOTE — Telephone Encounter (Signed)
MyChart message sent to pt about their appointment on 11/18/18 with Dr Shaw. °

## 2018-11-13 ENCOUNTER — Ambulatory Visit: Payer: Federal, State, Local not specified - PPO | Admitting: Podiatry

## 2018-11-13 ENCOUNTER — Encounter: Payer: Self-pay | Admitting: Podiatry

## 2018-11-13 DIAGNOSIS — M79676 Pain in unspecified toe(s): Secondary | ICD-10-CM

## 2018-11-13 DIAGNOSIS — B351 Tinea unguium: Secondary | ICD-10-CM | POA: Diagnosis not present

## 2018-11-13 DIAGNOSIS — E1159 Type 2 diabetes mellitus with other circulatory complications: Secondary | ICD-10-CM | POA: Diagnosis not present

## 2018-11-13 NOTE — Progress Notes (Signed)
Complaint:  Visit Type: Patient returns to my office for continued preventative foot care services. Complaint: Patient states" my nails have grown long and thick and become painful to walk and wear shoes" Patient has been diagnosed with DM with no foot complications. The patient presents for preventative foot care services. No changes to ROS  Podiatric Exam: Vascular: dorsalis pedis and posterior tibial pulses are not  palpable bilateral. Capillary return is immediate. Cold feet.. Skin turgor WNL  Sensorium: Diminished  Semmes Weinstein monofilament test left foot.  LOPS WNL right foot. Normal tactile sensation bilaterally. Nail Exam: Pt has thick disfigured discolored nails with subungual debris noted bilateral entire nail hallux through fifth toenails Ulcer Exam: There is no evidence of ulcer or pre-ulcerative changes or infection. Orthopedic Exam: Muscle tone and strength are WNL. No limitations in general ROM. No crepitus or effusions noted. Foot type and digits show no abnormalities. Bony prominences are unremarkable. Skin: No Porokeratosis. No infection or ulcers  Diagnosis:  Onychomycosis, , Pain in right toe, pain in left toes Diabetes with angiopathy  Treatment & Plan Procedures and Treatment: Consent by patient was obtained for treatment procedures. The patient understood the discussion of treatment and procedures well. All questions were answered thoroughly reviewed. Debridement of mycotic and hypertrophic toenails, 1 through 5 bilateral and clearing of subungual debris. No ulceration, no infection noted.  Return Visit-Office Procedure: Patient instructed to return to the office for a follow up visit 3 months for continued evaluation and treatment.    Helane Gunther DPM

## 2018-11-18 ENCOUNTER — Ambulatory Visit: Payer: Federal, State, Local not specified - PPO | Admitting: Family Medicine

## 2018-11-19 ENCOUNTER — Other Ambulatory Visit: Payer: Self-pay | Admitting: Family Medicine

## 2018-11-19 DIAGNOSIS — I10 Essential (primary) hypertension: Secondary | ICD-10-CM

## 2018-11-19 DIAGNOSIS — E1142 Type 2 diabetes mellitus with diabetic polyneuropathy: Secondary | ICD-10-CM

## 2018-11-19 NOTE — Telephone Encounter (Signed)
Requested Prescriptions  Pending Prescriptions Disp Refills  . glimepiride (AMARYL) 4 MG tablet [Pharmacy Med Name: GLIMEPIRIDE 4MG  TABLETS] 180 tablet 0    Sig: TAKE 2 TABLETS BY MOUTH ONCE DAILY WITH BREAKFAST     Endocrinology:  Diabetes - Sulfonylureas Passed - 11/19/2018  2:05 PM      Passed - HBA1C is between 0 and 7.9 and within 180 days    Hemoglobin A1C  Date Value Ref Range Status  07/19/2018 7.7 (A) 4.0 - 5.6 % Final   Hgb A1c MFr Bld  Date Value Ref Range Status  04/16/2013 8.4 (H) <5.7 % Final    Comment:    (NOTE)                                                                       According to the ADA Clinical Practice Recommendations for 2011, when HbA1c is used as a screening test:  >=6.5%   Diagnostic of Diabetes Mellitus           (if abnormal result is confirmed) 5.7-6.4%   Increased risk of developing Diabetes Mellitus References:Diagnosis and Classification of Diabetes Mellitus,Diabetes Care,2011,34(Suppl 1):S62-S69 and Standards of Medical Care in         Diabetes - 2011,Diabetes Care,2011,34 (Suppl 1):S11-S61.         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, without long-term current use of insulin, macular edema presence unspecified, unspecified laterality [facility])   [facility], Levell July, MD   1 year ago 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 lisinopril-hydrochlorothiazide (PRINZIDE,ZESTORETIC) 20-25 MG tablet [Pharmacy Med Name: LISINOPRIL-HCTZ 20/25MG  TABLETS] 60 tablet 0    Sig: TAKE 1 TABLET BY MOUTH EVERY DAY     Cardiovascular:  ACEI + Diuretic Combos Failed - 11/19/2018  2:05 PM      Failed - Last BP in normal range    BP Readings from Last 1 Encounters:  07/19/18 140/80         Passed - Na in normal range and within 180 days    Sodium  Date Value Ref Range Status  07/19/2018 139 134 - 144 mmol/L Final         Passed - K in normal range and  within 180 days    Potassium  Date Value Ref Range Status  07/19/2018 4.3 3.5 - 5.2 mmol/L Final         Passed - Cr in normal range and within 180 days    Creat  Date Value Ref Range Status  05/25/2016 0.99 0.70 - 1.33 mg/dL Final    Comment:      For patients > or = 59 years of age: The upper reference limit for Creatinine is approximately 13% higher for people identified as African-American.      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, without long-term current use of insulin, macular edema presence unspecified, unspecified laterality [facility])   [facility], Levell July, MD   1 year ago 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, without long-term current use of insulin, macular edema presence unspecified, unspecified laterality [facility])   [facility], Levell July, MD   1 year ago 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

## 2018-12-20 ENCOUNTER — Encounter: Payer: Self-pay | Admitting: Family Medicine

## 2018-12-20 ENCOUNTER — Other Ambulatory Visit: Payer: Self-pay

## 2018-12-20 ENCOUNTER — Ambulatory Visit: Payer: Federal, State, Local not specified - PPO | Admitting: Family Medicine

## 2018-12-20 VITALS — BP 150/74 | HR 89 | Temp 98.6°F | Resp 20 | Ht 67.01 in | Wt 214.4 lb

## 2018-12-20 DIAGNOSIS — E1142 Type 2 diabetes mellitus with diabetic polyneuropathy: Secondary | ICD-10-CM

## 2018-12-20 DIAGNOSIS — E785 Hyperlipidemia, unspecified: Secondary | ICD-10-CM | POA: Diagnosis not present

## 2018-12-20 DIAGNOSIS — I10 Essential (primary) hypertension: Secondary | ICD-10-CM

## 2018-12-20 DIAGNOSIS — E890 Postprocedural hypothyroidism: Secondary | ICD-10-CM | POA: Diagnosis not present

## 2018-12-20 DIAGNOSIS — G118 Other hereditary ataxias: Secondary | ICD-10-CM

## 2018-12-20 LAB — POCT GLYCOSYLATED HEMOGLOBIN (HGB A1C): Hemoglobin A1C: 8.4 % — AB (ref 4.0–5.6)

## 2018-12-20 MED ORDER — GLIMEPIRIDE 2 MG PO TABS: 6.0000 mg | ORAL_TABLET | Freq: Every day | ORAL | 0 refills | Status: DC

## 2018-12-20 NOTE — Progress Notes (Signed)
 3/13/20208:36 AM  Randy Ballard 06/15/1961, 58 y.o. male [id number]  Chief Complaint  Patient presents with  . Diabetes    pt would like A1C check    HPI:   Patient is a 58 y.o. male with past medical history significant for DM2, HTN, HLP, GERD, spinocerebellar ataxia 3, hypothyroidism who presents today for routine followup  Last OV Oct 2019 No changes to regime Overall doing well  Uses walker, feels he has gotten weaker this past year In the past was doing PT but unable to afford copay Does exercises at home Has rolltor walker and wheelchair in case needed Not interested in seeing neuro  cbgs doing fair, random: 125-160, sometimes in low 200s Has had issues with januvia - pancreatitis Has also had issues with dapagliflozin - yeast infections Not eating very much during the day, does not get hungry, remains full for long period of time Denies any lows Referral for eye exam made in Oct, he has not made an appt  Taking all his medications as prescribed, except today, ran out of the house wo taking his meds today   Lab Results  Component Value Date   HGBA1C 7.7 (A) 07/19/2018   HGBA1C 8.6 (A) 03/19/2018   HGBA1C 7.6 10/25/2017   Lab Results  Component Value Date   MICROALBUR 8.9 05/25/2016   LDLCALC 50 07/19/2018   CREATININE 0.98 07/19/2018   Lab Results  Component Value Date   TSH 4.080 07/19/2018    Fall Risk  12/20/2018 07/19/2018 10/25/2017 06/20/2017 01/12/2017  Falls in the past year? 0 Yes Yes Yes No  Comment - - - - -  Number falls in past yr: 1 1 1 1 -  Comment - - - - -  Injury with Fall? 1 (No Data) Yes Yes -  Comment r. ribs and face pt states about 1 year ago he hurt his right knee bruised rib and cut to face - -  Risk for fall due to : Impaired balance/gait - - - -  Follow up Falls evaluation completed - - - -     Depression screen PHQ 2/9 12/20/2018 07/19/2018 03/19/2018  Decreased Interest 0 0 0  Down, Depressed, Hopeless 0 0 0  PHQ - 2  Score 0 0 0    Allergies  Allergen Reactions  . Januvia [Sitagliptin] Nausea And Vomiting    pancreatitis    Prior to Admission medications   Medication Sig Start Date End Date Taking? Authorizing Provider  aspirin 81 MG tablet Take 1 tablet (81 mg total) by mouth daily. 12/23/11  Yes Richter, Karen L, MD  atorvastatin (LIPITOR) 40 MG tablet Take 1 tablet (40 mg total) by mouth daily. 07/19/18  Yes Santiago, Irma M, MD  Blood Glucose Monitoring Suppl (BLOOD GLUCOSE METER) kit Test blood sugar daily 12/19/13  Yes Shaw, Eva N, MD  calcium-vitamin D (OSCAL WITH D) 500-200 MG-UNIT per tablet Take 1 tablet by mouth 2 (two) times daily.   Yes [provider]  Cholecalciferol (VITAMIN D3) 5000 units CAPS Take 1 capsule (5,000 Units total) by mouth daily. 10/25/17  Yes Shaw, Eva N, MD  glimepiride (AMARYL) 4 MG tablet TAKE 2 TABLETS BY MOUTH ONCE DAILY WITH BREAKFAST 07/19/18  Yes Santiago, Irma M, MD  glimepiride (AMARYL) 4 MG tablet TAKE 2 TABLETS BY MOUTH ONCE DAILY WITH BREAKFAST 11/19/18  Yes Shaw, Eva N, MD  glucose blood test strip Test blood sugar daily. 12/19/13  Yes Shaw, Eva N, MD  Lancet   Devices (LANCING DEVICE) MISC Test blood sugar daily. 12/19/13  Yes Shaw, Eva N, MD  levothyroxine (SYNTHROID, LEVOTHROID) 125 MCG tablet TAKE 1 TABLET BY MOUTH ONCE DAILY BEFORE BREAKFAST 07/19/18  Yes Santiago, Irma M, MD  lisinopril-hydrochlorothiazide (PRINZIDE,ZESTORETIC) 20-25 MG tablet TAKE 1 TABLET BY MOUTH EVERY DAY 11/19/18  Yes Shaw, Eva N, MD  metFORMIN (GLUCOPHAGE) 1000 MG tablet Take 1 tablet (1,000 mg total) by mouth 2 (two) times daily with a meal. 07/19/18  Yes Santiago, Irma M, MD  metoprolol succinate (TOPROL-XL) 100 MG 24 hr tablet TAKE 1 TABLET BY MOUTH ONCE DAILY WITH OR IMMEDIATELY FOLLOWING A MEAL 07/19/18  Yes Santiago, Irma M, MD  ranitidine (ZANTAC) 150 MG tablet Take 1 tablet (150 mg total) by mouth 2 (two) times daily as needed for heartburn. 05/25/16  Yes Shaw, Eva N, MD     Past Medical History:  Diagnosis Date  . Diabetes mellitus without complication (HCC)   . Diverticulitis   . Hyperlipidemia   . Hypertension   . Hypogonadism male   . Neuromuscular disorder (HCC)   . SCA-3 (spinocerebellar ataxia type 3) (HCC)   . Thyroid disease 02/06/2009   Graves s/p RAIU; cold nodule s/p negative biopsy    Past Surgical History:  Procedure Laterality Date  . BIOPSY THYROID  02/06/2009   cold thyroid nodule; negative/benign.  . COLONOSCOPY W/ POLYPECTOMY  Feb 2014   5 polyps  . TOOTH EXTRACTION  10/2015   x 4 teeth    Social History   Tobacco Use  . Smoking status: Current Some Day Smoker    Packs/day: 0.50    Years: 32.00    Pack years: 16.00    Types: Cigarettes  . Smokeless tobacco: Never Used  . Tobacco comment: 10/26/15 trying to quit  Substance Use Topics  . Alcohol use: No    Alcohol/week: 0.0 standard drinks    Family History  Adopted: Yes  Problem Relation Age of Onset  . Ataxia Mother 53       SCA3  . Ataxia Maternal Grandmother        SCA3  . Ataxia Maternal Aunt        SCA3    Review of Systems  Constitutional: Negative for chills and fever.  Respiratory: Negative for cough and shortness of breath.   Cardiovascular: Negative for chest pain, palpitations and leg swelling.  Gastrointestinal: Negative for abdominal pain, nausea and vomiting.  per hpi   OBJECTIVE:  Blood pressure (!) 150/74, pulse 89, temperature 98.6 F (37 C), temperature source Oral, resp. rate 20, height 5' 7.01" (1.702 m), weight 214 lb 6.4 oz (97.3 kg), SpO2 99 %. Body mass index is 33.57 kg/m.   Wt Readings from Last 3 Encounters:  12/20/18 214 lb 6.4 oz (97.3 kg)  03/19/18 217 lb 9.6 oz (98.7 kg)  10/25/17 217 lb 12.8 oz (98.8 kg)   BP Readings from Last 3 Encounters:  12/20/18 (!) 150/74  07/19/18 140/80  03/19/18 124/70    Physical Exam Vitals signs and nursing note reviewed.  Constitutional:      Appearance: He is well-developed.   HENT:     Head: Normocephalic and atraumatic.  Eyes:     Conjunctiva/sclera: Conjunctivae normal.     Pupils: Pupils are equal, round, and reactive to light.  Neck:     Musculoskeletal: Neck supple.  Cardiovascular:     Rate and Rhythm: Normal rate and regular rhythm.     Heart sounds: No murmur. No friction rub.   No gallop.   Pulmonary:     Effort: Pulmonary effort is normal.     Breath sounds: Normal breath sounds. No wheezing or rales.  Skin:    General: Skin is warm and dry.  Neurological:     Mental Status: He is alert and oriented to person, place, and time.     Motor: Weakness present.     Coordination: Coordination abnormal.     Gait: Gait abnormal.     Results for orders placed or performed in visit on 12/20/18 (from the past 24 hour(s))  POCT glycosylated hemoglobin (Hb A1C)     Status: Abnormal   Collection Time: 12/20/18  8:50 AM  Result Value Ref Range   Hemoglobin A1C 8.4 (A) 4.0 - 5.6 %   HbA1c POC (<> result, manual entry)     HbA1c, POC (prediabetic range)     HbA1c, POC (controlled diabetic range)     Diabetic Foot Exam - Simple   Simple Foot Form Visual Inspection See comments:  Yes Sensation Testing Intact to touch and monofilament testing bilaterally:  Yes Pulse Check Posterior Tibialis and Dorsalis pulse intact bilaterally:  Yes Comments Both great toes nail fungus      ASSESSMENT and PLAN  1. Type 2 diabetes mellitus with diabetic polyneuropathy, without long-term current use of insulin (HCC) Uncontrolled. Increasing glimperide to 6mg a day, discussed dietary recommendation/changes. intolerant to all other non insulin meds. I would not try GLP1 given pancreatitis with DDP4.  - POCT glycosylated hemoglobin (Hb A1C) - Lipid panel - CMP14+EGFR - HM Diabetes Foot Exam - glimepiride (AMARYL) 2 MG tablet; Take 3 tablets (6 mg total) by mouth daily with breakfast. - Microalbumin/Creatinine Ratio, Urine; Future  2. Dyslipidemia Checking labs  today, medications will be adjusted as needed.   3. Essential hypertension Above goal in setting of not taking BP meds this morning. Previous at goal. Recheck at next visit.  - CBC  4. Postablative hypothyroidism Checking labs today, medications will be adjusted as needed.  - TSH  5. 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 Patient not interested in further eval. Feels well supported at home and has DME needed at this time.     Return in about 3 months (around 03/22/2019) for chronic medical conditions.    Irma M Santiago, MD [facility] [place] [address] [place] [address] [place], [place] [address] Ph.  [phone] Fax [phone]   

## 2018-12-20 NOTE — Patient Instructions (Addendum)
Please call [facility] to make you eye appointment Ophthalmology  [address] [place], [place], [place] [address]  [phone]    Diabetes Mellitus and Nutrition, Adult When you have diabetes (diabetes mellitus), it is very important to have healthy eating habits because your blood sugar (glucose) levels are greatly affected by what you eat and drink. Eating healthy foods in the appropriate amounts, at about the same times every day, can help you:  Control your blood glucose.  Lower your risk of heart disease.  Improve your blood pressure.  Reach or maintain a healthy weight. Every person with diabetes is different, and each person has different needs for a meal plan. Your health care provider may recommend that you work with a diet and nutrition specialist (dietitian) to make a meal plan that is best for you. Your meal plan may vary depending on factors such as:  The calories you need.  The medicines you take.  Your weight.  Your blood glucose, blood pressure, and cholesterol levels.  Your activity level.  Other health conditions you have, such as heart or kidney disease. How do carbohydrates affect me? Carbohydrates, also called carbs, affect your blood glucose level more than any other type of food. Eating carbs naturally raises the amount of glucose in your blood. Carb counting is a method for keeping track of how many carbs you eat. Counting carbs is important to keep your blood glucose at a healthy level, especially if you use insulin or take certain oral diabetes medicines. It is important to know how many carbs you can safely have in each meal. This is different for every person. Your dietitian can help you calculate how many carbs you should have at each meal and for each snack. Foods that contain carbs include:  Bread, cereal, rice, pasta, and crackers.  Potatoes and corn.  Peas, beans, and lentils.  Milk and yogurt.  Fruit and juice.  Desserts, such as  cakes, cookies, ice cream, and candy. How does alcohol affect me? Alcohol can cause a sudden decrease in blood glucose (hypoglycemia), especially if you use insulin or take certain oral diabetes medicines. Hypoglycemia can be a life-threatening condition. Symptoms of hypoglycemia (sleepiness, dizziness, and confusion) are similar to symptoms of having too much alcohol. If your health care provider says that alcohol is safe for you, follow these guidelines:  Limit alcohol intake to no more than 1 drink per day for nonpregnant women and 2 drinks per day for men. One drink equals 12 oz of beer, 5 oz of wine, or 1 oz of hard liquor.  Do not drink on an empty stomach.  Keep yourself hydrated with water, diet soda, or unsweetened iced tea.  Keep in mind that regular soda, juice, and other mixers may contain a lot of sugar and must be counted as carbs. What are tips for following this plan?  Reading food labels  Start by checking the serving size on the "Nutrition Facts" label of packaged foods and drinks. The amount of calories, carbs, fats, and other nutrients listed on the label is based on one serving of the item. Many items contain more than one serving per package.  Check the total grams (g) of carbs in one serving. You can calculate the number of servings of carbs in one serving by dividing the total carbs by 15. For example, if a food has 30 g of total carbs, it would be equal to 2 servings of carbs.  Check  the number of grams (g) of saturated and trans fats in one serving. Choose foods that have low or no amount of these fats.  Check the number of milligrams (mg) of salt (sodium) in one serving. Most people should limit total sodium intake to less than 2,300 mg per day.  Always check the nutrition information of foods labeled as "low-fat" or "nonfat". These foods may be higher in added sugar or refined carbs and should be avoided.  Talk to your dietitian to identify your daily goals for  nutrients listed on the label. Shopping  Avoid buying canned, premade, or processed foods. These foods tend to be high in fat, sodium, and added sugar.  Shop around the outside edge of the grocery store. This includes fresh fruits and vegetables, bulk grains, fresh meats, and fresh dairy. Cooking  Use low-heat cooking methods, such as baking, instead of high-heat cooking methods like deep frying.  Cook using healthy oils, such as olive, canola, or sunflower oil.  Avoid cooking with butter, cream, or high-fat meats. Meal planning  Eat meals and snacks regularly, preferably at the same times every day. Avoid going long periods of time without eating.  Eat foods high in fiber, such as fresh fruits, vegetables, beans, and whole grains. Talk to your dietitian about how many servings of carbs you can eat at each meal.  Eat 4-6 ounces (oz) of lean protein each day, such as lean meat, chicken, fish, eggs, or tofu. One oz of lean protein is equal to: ? 1 oz of meat, chicken, or fish. ? 1 egg. ?  cup of tofu.  Eat some foods each day that contain healthy fats, such as avocado, nuts, seeds, and fish. Lifestyle  Check your blood glucose regularly.  Exercise regularly as told by your health care provider. This may include: ? 150 minutes of moderate-intensity or vigorous-intensity exercise each week. This could be brisk walking, biking, or water aerobics. ? Stretching and doing strength exercises, such as yoga or weightlifting, at least 2 times a week.  Take medicines as told by your health care provider.  Do not use any products that contain nicotine or tobacco, such as cigarettes and e-cigarettes. If you need help quitting, ask your health care provider.  Work with a Social worker or diabetes educator to identify strategies to manage stress and any emotional and social challenges. Questions to ask a health care provider  Do I need to meet with a diabetes educator?  Do I need to meet with a  dietitian?  What number can I call if I have questions?  When are the best times to check my blood glucose? Where to find more information:  American Diabetes Association: diabetes.org  Academy of Nutrition and Dietetics: www.eatright.[employer] of Diabetes and Digestive and Kidney Diseases (NIH): [employer] Summary  A healthy meal plan will help you control your blood glucose and maintain a healthy lifestyle.  Working with a diet and nutrition specialist (dietitian) can help you make a meal plan that is best for you.  Keep in mind that carbohydrates (carbs) and alcohol have immediate effects on your blood glucose levels. It is important to count carbs and to use alcohol carefully. This information is not intended to replace advice given to you by your health care provider. Make sure you discuss any questions you have with your health care provider. Document Released: 06/22/2005 Document Revised: 04/25/2017 Document Reviewed: 10/30/2016 Elsevier Interactive Patient Education  2019 [employer].

## 2018-12-21 LAB — CMP14+EGFR
ALT: 36 IU/L (ref 0–44)
AST: 23 IU/L (ref 0–40)
Albumin/Globulin Ratio: 1.8 (ref 1.2–2.2)
Albumin: 4.6 g/dL (ref 3.8–4.9)
Alkaline Phosphatase: 73 IU/L (ref 39–117)
BUN/Creatinine Ratio: 11 (ref 9–20)
BUN: 10 mg/dL (ref 6–24)
Bilirubin Total: 0.3 mg/dL (ref 0.0–1.2)
CO2: 23 mmol/L (ref 20–29)
Calcium: 9.9 mg/dL (ref 8.7–10.2)
Chloride: 100 mmol/L (ref 96–106)
Creatinine, Ser: 0.94 mg/dL (ref 0.76–1.27)
GFR calc Af Amer: 103 mL/min/{1.73_m2} (ref >59)
GFR calc non Af Amer: 89 mL/min/{1.73_m2} (ref >59)
Globulin, Total: 2.6 g/dL (ref 1.5–4.5)
Glucose: 147 mg/dL — ABNORMAL HIGH (ref 65–99)
Potassium: 4.3 mmol/L (ref 3.5–5.2)
Sodium: 143 mmol/L (ref 134–144)
Total Protein: 7.2 g/dL (ref 6.0–8.5)

## 2018-12-21 LAB — CBC
Hematocrit: 35.7 % — ABNORMAL LOW (ref 37.5–51.0)
Hemoglobin: 12.3 g/dL — ABNORMAL LOW (ref 13.0–17.7)
MCH: 31.8 pg (ref 26.6–33.0)
MCHC: 34.5 g/dL (ref 31.5–35.7)
MCV: 92 fL (ref 79–97)
Platelets: 313 10*3/uL (ref 150–450)
RBC: 3.87 x10E6/uL — ABNORMAL LOW (ref 4.14–5.80)
RDW: 11.3 % — ABNORMAL LOW (ref 11.6–15.4)
WBC: 9.7 10*3/uL (ref 3.4–10.8)

## 2018-12-21 LAB — LIPID PANEL
Chol/HDL Ratio: 5 ratio (ref 0.0–5.0)
Cholesterol, Total: 169 mg/dL (ref 100–199)
HDL: 34 mg/dL — ABNORMAL LOW (ref >39)
LDL Calculated: 100 mg/dL — ABNORMAL HIGH (ref 0–99)
Triglycerides: 174 mg/dL — ABNORMAL HIGH (ref 0–149)
VLDL Cholesterol Cal: 35 mg/dL (ref 5–40)

## 2018-12-21 LAB — TSH: TSH: 2.88 u[IU]/mL (ref 0.450–4.500)

## 2018-12-23 DIAGNOSIS — E1142 Type 2 diabetes mellitus with diabetic polyneuropathy: Secondary | ICD-10-CM | POA: Diagnosis not present

## 2018-12-23 NOTE — Addendum Note (Signed)
Addended by: Rogelia Rohrer on: 12/23/2018 05:22 PM   Modules accepted: Orders

## 2018-12-24 LAB — MICROALBUMIN / CREATININE URINE RATIO
Creatinine, Urine: 184.1 mg/dL
Microalb/Creat Ratio: 24 mg/g creat (ref 0–29)
Microalbumin, Urine: 44.1 ug/mL

## 2019-01-23 ENCOUNTER — Other Ambulatory Visit: Payer: Self-pay | Admitting: Family Medicine

## 2019-01-23 DIAGNOSIS — I10 Essential (primary) hypertension: Secondary | ICD-10-CM

## 2019-01-23 NOTE — Telephone Encounter (Signed)
Requested Prescriptions  Pending Prescriptions Disp Refills  . lisinopril-hydrochlorothiazide (PRINZIDE,ZESTORETIC) 20-25 MG tablet [Pharmacy Med Name: LISINOPRIL-HCTZ 20/25MG  TABLETS] 90 tablet 1    Sig: TAKE 1 TABLET BY MOUTH EVERY DAY     Cardiovascular:  ACEI + Diuretic Combos Failed - 01/23/2019 12:17 PM      Failed - Last BP in normal range    BP Readings from Last 1 Encounters:  12/20/18 (!) 150/74         Passed - Na in normal range and within 180 days    Sodium  Date Value Ref Range Status  12/20/2018 143 134 - 144 mmol/L Final         Passed - K in normal range and within 180 days    Potassium  Date Value Ref Range Status  12/20/2018 4.3 3.5 - 5.2 mmol/L Final         Passed - Cr in normal range and within 180 days    Creat  Date Value Ref Range Status  05/25/2016 0.99 0.70 - 1.33 mg/dL Final    Comment:      For patients > or = 59 years of age: The upper reference limit for Creatinine is approximately 13% higher for people identified as African-American.      Creatinine, Ser  Date Value Ref Range Status  12/20/2018 0.94 0.76 - 1.27 mg/dL Final         Passed - Ca in normal range and within 180 days    Calcium  Date Value Ref Range Status  12/20/2018 9.9 8.7 - 10.2 mg/dL Final         Passed - Patient is not pregnant      Passed - Valid encounter within last 6 months    Recent Outpatient Visits          1 month ago Type 2 diabetes mellitus with diabetic polyneuropathy, without long-term current use of insulin (HCC)   [facility], Meda Coffee, MD   6 months ago Type 2 diabetes mellitus with diabetic polyneuropathy, without long-term current use of insulin (HCC)   [facility], Meda Coffee, MD   10 months ago 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, without long-term current use of insulin, macular edema presence unspecified, unspecified laterality [facility])   [facility], Levell July, MD   1  year ago 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

## 2019-02-12 ENCOUNTER — Ambulatory Visit: Payer: Federal, State, Local not specified - PPO | Admitting: Podiatry

## 2019-03-06 ENCOUNTER — Other Ambulatory Visit: Payer: Self-pay | Admitting: Family Medicine

## 2019-03-26 ENCOUNTER — Ambulatory Visit: Payer: Federal, State, Local not specified - PPO | Admitting: Family Medicine

## 2019-04-09 ENCOUNTER — Other Ambulatory Visit: Payer: Self-pay | Admitting: Family Medicine

## 2019-04-09 DIAGNOSIS — E1142 Type 2 diabetes mellitus with diabetic polyneuropathy: Secondary | ICD-10-CM

## 2019-04-11 ENCOUNTER — Ambulatory Visit: Payer: Federal, State, Local not specified - PPO | Admitting: Family Medicine

## 2019-05-01 ENCOUNTER — Ambulatory Visit: Payer: Federal, State, Local not specified - PPO | Admitting: Family Medicine

## 2019-05-01 ENCOUNTER — Other Ambulatory Visit: Payer: Self-pay

## 2019-05-01 ENCOUNTER — Encounter: Payer: Self-pay | Admitting: Family Medicine

## 2019-05-01 VITALS — BP 140/78 | HR 76 | Temp 98.5°F | Ht 68.0 in | Wt 213.4 lb

## 2019-05-01 DIAGNOSIS — G118 Other hereditary ataxias: Secondary | ICD-10-CM | POA: Diagnosis not present

## 2019-05-01 DIAGNOSIS — E1142 Type 2 diabetes mellitus with diabetic polyneuropathy: Secondary | ICD-10-CM | POA: Diagnosis not present

## 2019-05-01 DIAGNOSIS — E785 Hyperlipidemia, unspecified: Secondary | ICD-10-CM | POA: Diagnosis not present

## 2019-05-01 DIAGNOSIS — D649 Anemia, unspecified: Secondary | ICD-10-CM

## 2019-05-01 MED ORDER — GLIMEPIRIDE 4 MG PO TABS: 8.0000 mg | ORAL_TABLET | Freq: Every day | ORAL | 3 refills | Status: DC

## 2019-05-01 NOTE — Patient Instructions (Signed)
° ° ° °  If you have lab work done today you will be contacted with your lab results within the next 2 weeks.  If you have not heard from us then please contact us. The fastest way to get your results is to register for My Chart. ° ° °IF you received an x-ray today, you will receive an invoice from [facility]. Please contact [facility] at [phone] with questions or concerns regarding your invoice.  ° °IF you received labwork today, you will receive an invoice from [facility]. Please contact [facility] at [phone] with questions or concerns regarding your invoice.  ° °Our billing staff will not be able to assist you with questions regarding bills from these companies. ° °You will be contacted with the lab results as soon as they are available. The fastest way to get your results is to activate your My Chart account. Instructions are located on the last page of this paperwork. If you have not heard from us regarding the results in 2 weeks, please contact this office. °  ° ° ° °

## 2019-05-01 NOTE — Progress Notes (Signed)
7/23/202012:13 PM  Randy Ballard [birth year], 58 y.o., male [id number]  Chief Complaint  Patient presents with  . chronic conditions    3 m f/u     HPI:   Patient is a 58 y.o. male with past medical history significant for DM2, HTN, HLP, GERD, spinocerebellar ataxia 3, hypothyroidismwho presents today for routine followup  Last OV March 2020 Increased glimperide to 15m a day Has not been checking cbgs very often He however mentions that he used to be on 852mof glimperide  Has not had any falls since last OV Uses a 4W roller walker Did take BP meds this morning, BP is 130/80s Has been doing well since our last visit Has not acute concerns today   Lab Results  Component Value Date   HGBA1C 8.4 (A) 12/20/2018   HGBA1C 7.7 (A) 07/19/2018   HGBA1C 8.6 (A) 03/19/2018   Lab Results  Component Value Date   MICROALBUR 8.9 05/25/2016   LDLCALC 100 (H) 12/20/2018   CREATININE 0.94 12/20/2018   Lab Results  Component Value Date   TSH 2.880 12/20/2018    Depression screen PHQ 2/9 05/01/2019 12/20/2018 07/19/2018  Decreased Interest 0 0 0  Down, Depressed, Hopeless 0 0 0  PHQ - 2 Score 0 0 0    Fall Risk  05/01/2019 12/20/2018 07/19/2018 10/25/2017 06/20/2017  Falls in the past year? 1 0 Yes Yes Yes  Comment - - - - -  Number falls in past yr: _0 Comment - - - - -  Injury with Fall? 1 1 (No Data) Yes Yes  Comment - r. ribs and face pt states about 1 year ago he hurt his right knee bruised rib and cut to face -  Risk for fall due to : History of fall(s);Impaired balance/gait;Impaired mobility Impaired balance/gait - - -  Follow up Falls evaluation completed Falls evaluation completed - - -     Allergies  Allergen Reactions  . Januvia [Sitagliptin] Nausea And Vomiting    pancreatitis    Prior to Admission medications   Medication Sig Start Date End Date Taking? Authorizing Provider  aspirin 81 MG tablet Take 1 tablet (81 mg total) by mouth daily. 12/23/11   Yes RiHayden RasmussenMD  atorvastatin (LIPITOR) 40 MG tablet Take 1 tablet (40 mg total) by mouth daily. 07/19/18  Yes SaRutherford GuysMD  Blood Glucose Monitoring Suppl (BLOOD GLUCOSE METER) kit Test blood sugar daily 12/19/13  Yes ShShawnee KnappMD  calcium-vitamin D (OSCAL WITH D) 500-200 MG-UNIT per tablet Take 1 tablet by mouth 2 (two) times daily.   Yes [provider]  Cholecalciferol (VITAMIN D3) 5000 units CAPS Take 1 capsule (5,000 Units total) by mouth daily. 10/25/17  Yes ShShawnee KnappMD  glimepiride (AMARYL) 2 MG tablet TAKE 3 TABLETS(6 MG) BY MOUTH DAILY WITH BREAKFAST 04/09/19  Yes SaGrant Fontana, MD  glucose blood test strip Test blood sugar daily. 12/19/13  Yes ShShawnee KnappMD  Lancet Devices (LANCING DEVICE) MISC Test blood sugar daily. 12/19/13  Yes ShShawnee KnappMD  levothyroxine (SYNTHROID) 125 MCG tablet TAKE 1 TABLET BY MOUTH EVERY DAY BEFORE BREAKFAST 03/06/19  Yes SaRutherford GuysMD  lisinopril-hydrochlorothiazide (PRINZIDE,ZESTORETIC) 20-25 MG tablet TAKE 1 TABLET BY MOUTH EVERY DAY 01/23/19  Yes SaRutherford GuysMD  metFORMIN (GLUCOPHAGE) 1000 MG tablet TAKE 1 TABLET(1000 MG) BY MOUTH TWICE DAILY WITH A MEAL 04/09/19  Yes SaGrant Fontana  M, MD  metoprolol succinate (TOPROL-XL) 100 MG 24 hr tablet TAKE 1 TABLET BY MOUTH EVERY DAY OR IMMEDIATELY FOLLOWING A MEAL 03/06/19  Yes Rutherford Guys, MD  ranitidine (ZANTAC) 150 MG tablet Take 1 tablet (150 mg total) by mouth 2 (two) times daily as needed for heartburn. Patient not taking: Reported on 05/01/2019 05/25/16   Shawnee Knapp, MD    Past Medical History:  Diagnosis Date  . Diabetes mellitus without complication ([place])   . Diverticulitis   . Hyperlipidemia   . Hypertension   . Hypogonadism male   . Neuromuscular disorder ([place])   . SCA-3 (spinocerebellar ataxia type 3) ([place])   . Thyroid disease 02/06/2009   [place] s/p RAIU; cold nodule s/p negative biopsy    Past Surgical History:  Procedure Laterality Date  .  BIOPSY THYROID  02/06/2009   cold thyroid nodule; negative/benign.  . COLONOSCOPY W/ POLYPECTOMY  Feb 2014   5 polyps  . TOOTH EXTRACTION  10/2015   x 4 teeth    Social History   Tobacco Use  . Smoking status: Current Some Day Smoker    Packs/day: 0.50    Years: 32.00    Pack years: 16.00    Types: Cigarettes  . Smokeless tobacco: Never Used  . Tobacco comment: 10/26/15 trying to quit  Substance Use Topics  . Alcohol use: No    Alcohol/week: 0.0 standard drinks    Family History  Adopted: Yes  Problem Relation Age of Onset  . Ataxia Mother 4       SCA3  . Ataxia Maternal Grandmother        SCA3  . Ataxia Maternal Aunt        SCA3    Review of Systems  Constitutional: Negative for chills and fever.  Respiratory: Negative for cough and shortness of breath.   Cardiovascular: Negative for chest pain, palpitations and leg swelling.  Gastrointestinal: Negative for abdominal pain, nausea and vomiting.     OBJECTIVE:  Today's Vitals   05/01/19 1136 05/01/19 1142 05/01/19 1234  BP: (!) 190/96 (!) 156/82 140/78  Pulse: 76    Temp: 98.5 F (36.9 C)    TempSrc: Oral    SpO2: 100%    Weight: 213 lb 6.4 oz (96.8 kg)    Height: _0  (1.727 m)     Body mass index is 32.45 kg/m.   Physical Exam Vitals signs and nursing note reviewed.  Constitutional:      Appearance: He is well-developed.  HENT:     Head: Normocephalic and atraumatic.  Eyes:     Conjunctiva/sclera: Conjunctivae normal.     Pupils: Pupils are equal, round, and reactive to light.  Neck:     Musculoskeletal: Neck supple.  Cardiovascular:     Rate and Rhythm: Normal rate and regular rhythm.     Heart sounds: No murmur. No friction rub. No gallop.   Pulmonary:     Effort: Pulmonary effort is normal.     Breath sounds: Normal breath sounds. No wheezing or rales.  Skin:    General: Skin is warm and dry.  Neurological:     Mental Status: He is alert and oriented to person, place, and time.      ASSESSMENT and PLAN  1. Type 2 diabetes mellitus with diabetic polyneuropathy, without long-term current use of insulin ([place]) Checking labs today, medications will be adjusted as needed. Consider acarbose given declines injectables and has been intolerant/side effects to DDP4 and SGLT2 -  Hemoglobin A1c  2. Hyperlipidemia LDL goal <100 Checking labs today, medications will be adjusted as needed.  - Lipid panel - CMP14+EGFR  3. SCA-3 (spinocerebellar ataxia type 3) (HCC) Stable. Doing better with new walker  4. Anemia, unspecified type - CBC - Iron, TIBC and Ferritin Panel  Other orders - glimepiride (AMARYL) 4 MG tablet; Take 2 tablets (8 mg total) by mouth daily before breakfast.  Return in about 3 months (around 08/01/2019).    Rutherford Guys, MD [facility] at [place], [place] [address] Ph.  [phone] Fax [phone]

## 2019-05-02 LAB — LIPID PANEL
Chol/HDL Ratio: 5.3 ratio — ABNORMAL HIGH (ref 0.0–5.0)
Cholesterol, Total: 205 mg/dL — ABNORMAL HIGH (ref 100–199)
HDL: 39 mg/dL — ABNORMAL LOW (ref >39)
LDL Calculated: 132 mg/dL — ABNORMAL HIGH (ref 0–99)
Triglycerides: 169 mg/dL — ABNORMAL HIGH (ref 0–149)
VLDL Cholesterol Cal: 34 mg/dL (ref 5–40)

## 2019-05-02 LAB — CMP14+EGFR
ALT: 23 IU/L (ref 0–44)
AST: 20 IU/L (ref 0–40)
Albumin/Globulin Ratio: 2.1 (ref 1.2–2.2)
Albumin: 4.8 g/dL (ref 3.8–4.9)
Alkaline Phosphatase: 81 IU/L (ref 39–117)
BUN/Creatinine Ratio: 10 (ref 9–20)
BUN: 9 mg/dL (ref 6–24)
Bilirubin Total: 0.4 mg/dL (ref 0.0–1.2)
CO2: 24 mmol/L (ref 20–29)
Calcium: 9.7 mg/dL (ref 8.7–10.2)
Chloride: 96 mmol/L (ref 96–106)
Creatinine, Ser: 0.93 mg/dL (ref 0.76–1.27)
GFR calc Af Amer: 104 mL/min/{1.73_m2} (ref >59)
GFR calc non Af Amer: 90 mL/min/{1.73_m2} (ref >59)
Globulin, Total: 2.3 g/dL (ref 1.5–4.5)
Glucose: 144 mg/dL — ABNORMAL HIGH (ref 65–99)
Potassium: 4.4 mmol/L (ref 3.5–5.2)
Sodium: 139 mmol/L (ref 134–144)
Total Protein: 7.1 g/dL (ref 6.0–8.5)

## 2019-05-02 LAB — CBC
Hematocrit: 37.2 % — ABNORMAL LOW (ref 37.5–51.0)
Hemoglobin: 13.1 g/dL (ref 13.0–17.7)
MCH: 32 pg (ref 26.6–33.0)
MCHC: 35.2 g/dL (ref 31.5–35.7)
MCV: 91 fL (ref 79–97)
Platelets: 286 10*3/uL (ref 150–450)
RBC: 4.09 x10E6/uL — ABNORMAL LOW (ref 4.14–5.80)
RDW: 11.2 % — ABNORMAL LOW (ref 11.6–15.4)
WBC: 8.3 10*3/uL (ref 3.4–10.8)

## 2019-05-02 LAB — IRON,TIBC AND FERRITIN PANEL
Ferritin: 104 ng/mL (ref 30–400)
Iron Saturation: 28 % (ref 15–55)
Iron: 82 ug/dL (ref 38–169)
Total Iron Binding Capacity: 288 ug/dL (ref 250–450)
UIBC: 206 ug/dL (ref 111–343)

## 2019-05-02 LAB — HEMOGLOBIN A1C
Est. average glucose Bld gHb Est-mCnc: 197 mg/dL
Hgb A1c MFr Bld: 8.5 % — ABNORMAL HIGH (ref 4.8–5.6)

## 2019-05-22 MED ORDER — FARXIGA 10 MG PO TABS: 10.0000 mg | ORAL_TABLET | Freq: Every day | ORAL | 3 refills | Status: DC

## 2019-05-22 MED ORDER — ATORVASTATIN CALCIUM 80 MG PO TABS: 80.0000 mg | ORAL_TABLET | Freq: Every day | ORAL | 3 refills | Status: DC

## 2019-05-22 NOTE — Addendum Note (Signed)
Addended by: Rutherford Guys on: 05/22/2019 08:57 AM   Modules accepted: Orders

## 2019-06-02 ENCOUNTER — Other Ambulatory Visit: Payer: Self-pay | Admitting: Family Medicine

## 2019-07-31 ENCOUNTER — Other Ambulatory Visit: Payer: Self-pay | Admitting: Family Medicine

## 2019-07-31 DIAGNOSIS — I10 Essential (primary) hypertension: Secondary | ICD-10-CM

## 2019-07-31 NOTE — Telephone Encounter (Signed)
Forwarding medication refill request to the clinical pool for review. 

## 2019-08-01 ENCOUNTER — Ambulatory Visit: Payer: Federal, State, Local not specified - PPO | Admitting: Family Medicine

## 2019-08-20 ENCOUNTER — Other Ambulatory Visit: Payer: Self-pay | Admitting: Family Medicine

## 2019-08-22 ENCOUNTER — Encounter: Payer: Self-pay | Admitting: Family Medicine

## 2019-08-22 ENCOUNTER — Other Ambulatory Visit: Payer: Self-pay

## 2019-08-22 ENCOUNTER — Ambulatory Visit: Payer: Federal, State, Local not specified - PPO | Admitting: Family Medicine

## 2019-08-22 VITALS — BP 118/80 | HR 77 | Temp 98.6°F | Ht 68.0 in | Wt 213.0 lb

## 2019-08-22 DIAGNOSIS — G118 Other hereditary ataxias: Secondary | ICD-10-CM

## 2019-08-22 DIAGNOSIS — E890 Postprocedural hypothyroidism: Secondary | ICD-10-CM

## 2019-08-22 DIAGNOSIS — I10 Essential (primary) hypertension: Secondary | ICD-10-CM

## 2019-08-22 DIAGNOSIS — E785 Hyperlipidemia, unspecified: Secondary | ICD-10-CM | POA: Diagnosis not present

## 2019-08-22 DIAGNOSIS — E113299 Type 2 diabetes mellitus with mild nonproliferative diabetic retinopathy without macular edema, unspecified eye: Secondary | ICD-10-CM

## 2019-08-22 DIAGNOSIS — Z125 Encounter for screening for malignant neoplasm of prostate: Secondary | ICD-10-CM

## 2019-08-22 DIAGNOSIS — Z23 Encounter for immunization: Secondary | ICD-10-CM

## 2019-08-22 MED ORDER — METFORMIN HCL 500 MG PO TABS: 1000.0000 mg | ORAL_TABLET | Freq: Two times a day (BID) | ORAL | 3 refills | Status: DC

## 2019-08-22 NOTE — Patient Instructions (Signed)
° ° ° °  If you have lab work done today you will be contacted with your lab results within the next 2 weeks.  If you have not heard from us then please contact us. The fastest way to get your results is to register for My Chart. ° ° °IF you received an x-ray today, you will receive an invoice from [facility]. Please contact [facility] at [phone] with questions or concerns regarding your invoice.  ° °IF you received labwork today, you will receive an invoice from [facility]. Please contact [facility] at [phone] with questions or concerns regarding your invoice.  ° °Our billing staff will not be able to assist you with questions regarding bills from these companies. ° °You will be contacted with the lab results as soon as they are available. The fastest way to get your results is to activate your My Chart account. Instructions are located on the last page of this paperwork. If you have not heard from us regarding the results in 2 weeks, please contact this office. °  ° ° ° °

## 2019-08-22 NOTE — Progress Notes (Signed)
11/13/20204:30 PM  Randy Ballard April 03, 1961, 58 y.o., male [id number]  Chief Complaint  Patient presents with  . Follow-up    chronic condition, never took the farxiga, says new dmeds give him bad side effects. Having trouble swallowing pills. Starting to cut the pills to make it easier.    HPI:   Patient is a 58 y.o. male with past medical history significant for DM2, HTN, HLP, GERD, spinocerebellar ataxia 3, hypothyroidismwho presents today for routine followup  Last OV July 2020 Overall doing well About 2 months fell when he bend down to pickup a package, no injuries Uses rollator walker, does well with it, able to operate brakes He has not been following with neurology for years Having trouble with swallowing big pills, specially metformin, has been cutting in 1/2 and 1/4 tabs No problems with food or liquids He stopped taking farxiga, did not tolerate increased urination Declines use of injectables  Lab Results  Component Value Date   HGBA1C 8.5 (H) 05/01/2019   HGBA1C 8.4 (A) 12/20/2018   HGBA1C 7.7 (A) 07/19/2018   Lab Results  Component Value Date   MICROALBUR 8.9 05/25/2016   LDLCALC 132 (H) 05/01/2019   CREATININE 0.93 05/01/2019    Depression screen PHQ 2/9 05/01/2019 12/20/2018 07/19/2018  Decreased Interest 0 0 0  Down, Depressed, Hopeless 0 0 0  PHQ - 2 Score 0 0 0    Fall Risk  08/22/2019 05/01/2019 12/20/2018 07/19/2018 10/25/2017  Falls in the past year? - 1 0 Yes Yes  Comment - - - - -  Number falls in past yr: - '1 1 1 1  ' Comment - - - - -  Injury with Fall? - 1 1 (No Data) Yes  Comment - - r. ribs and face pt states about 1 year ago he hurt his right knee bruised rib and cut to face  Risk for fall due to : Impaired balance/gait;Impaired mobility History of fall(s);Impaired balance/gait;Impaired mobility Impaired balance/gait - -  Follow up - Falls evaluation completed Falls evaluation completed - -     Allergies  Allergen Reactions  .  Januvia [Sitagliptin] Nausea And Vomiting    pancreatitis    Prior to Admission medications   Medication Sig Start Date End Date Taking? Authorizing Provider  aspirin 81 MG tablet Take 1 tablet (81 mg total) by mouth daily. 12/23/11  Yes Hayden Rasmussen, MD  atorvastatin (LIPITOR) 80 MG tablet Take 1 tablet (80 mg total) by mouth daily. 05/22/19  Yes Randy Guys, MD  Blood Glucose Monitoring Suppl (BLOOD GLUCOSE METER) kit Test blood sugar daily 12/19/13  Yes Shawnee Knapp, MD  calcium-vitamin D (OSCAL WITH D) 500-200 MG-UNIT per tablet Take 1 tablet by mouth 2 (two) times daily.   Yes [provider]  Cholecalciferol (VITAMIN D3) 5000 units CAPS Take 1 capsule (5,000 Units total) by mouth daily. 10/25/17  Yes Shawnee Knapp, MD  dapagliflozin propanediol (FARXIGA) 10 MG TABS tablet Take 10 mg by mouth daily before breakfast. 05/22/19  Yes Randy Guys, MD  glimepiride (AMARYL) 4 MG tablet Take 2 tablets (8 mg total) by mouth daily before breakfast. 05/01/19  Yes Randy Guys, MD  glucose blood test strip Test blood sugar daily. 12/19/13  Yes Shawnee Knapp, MD  Lancet Devices (LANCING DEVICE) MISC Test blood sugar daily. 12/19/13  Yes Shawnee Knapp, MD  levothyroxine (SYNTHROID) 125 MCG tablet TAKE 1 TABLET BY MOUTH EVERY DAY BEFORE BREAKFAST 08/20/19  Yes  Randy Guys, MD  lisinopril-hydrochlorothiazide (ZESTORETIC) 20-25 MG tablet TAKE 1 TABLET BY MOUTH EVERY DAY 08/01/19  Yes Randy Guys, MD  metFORMIN (GLUCOPHAGE) 1000 MG tablet TAKE 1 TABLET(1000 MG) BY MOUTH TWICE DAILY WITH A MEAL 04/09/19  Yes Randy Guys, MD  metoprolol succinate (TOPROL-XL) 100 MG 24 hr tablet TAKE 1 TABLET BY MOUTH EVERY DAY IMMEDIATELY FOLLOWING A MEAL 06/02/19  Yes Randy Guys, MD    Past Medical History:  Diagnosis Date  . Diabetes mellitus without complication ([place])   . Diverticulitis   . Hyperlipidemia   . Hypertension   . Hypogonadism male   . Neuromuscular disorder ([place])   . SCA-3  (spinocerebellar ataxia type 3) ([place])   . Thyroid disease 02/06/2009   [facility] s/p RAIU; cold nodule s/p negative biopsy    Past Surgical History:  Procedure Laterality Date  . BIOPSY THYROID  02/06/2009   cold thyroid nodule; negative/benign.  . COLONOSCOPY W/ POLYPECTOMY  Feb 2014   5 polyps  . TOOTH EXTRACTION  10/2015   x 4 teeth    Social History   Tobacco Use  . Smoking status: Current Some Day Smoker    Packs/day: 0.50    Years: 32.00    Pack years: 16.00    Types: Cigarettes  . Smokeless tobacco: Never Used  . Tobacco comment: 10/26/15 trying to quit  Substance Use Topics  . Alcohol use: No    Alcohol/week: 0.0 standard drinks    Family History  Adopted: Yes  Problem Relation Age of Onset  . Ataxia Mother 59       SCA3  . Ataxia Maternal Grandmother        SCA3  . Ataxia Maternal Aunt        SCA3    Review of Systems  Constitutional: Negative for chills and fever.  Respiratory: Negative for cough and shortness of breath.   Cardiovascular: Negative for chest pain, palpitations and leg swelling.  Gastrointestinal: Negative for abdominal pain, nausea and vomiting.  per hpi   OBJECTIVE:  Today's Vitals   08/22/19 1614  BP: 118/80  Pulse: 77  Temp: 98.6 F (37 C)  SpO2: 99%  Weight: 213 lb (96.6 kg)  Height: '5\' 8"'  (1.727 m)   Body mass index is 32.39 kg/m.   Physical Exam Vitals signs and nursing note reviewed.  Constitutional:      Appearance: He is well-developed.  HENT:     Head: Normocephalic and atraumatic.  Eyes:     Conjunctiva/sclera: Conjunctivae normal.     Pupils: Pupils are equal, round, and reactive to light.  Neck:     Musculoskeletal: Neck supple.  Cardiovascular:     Rate and Rhythm: Normal rate and regular rhythm.     Heart sounds: No murmur. No friction rub. No gallop.   Pulmonary:     Effort: Pulmonary effort is normal.     Breath sounds: Normal breath sounds. No wheezing or rales.  Skin:    General: Skin is warm and  dry.  Neurological:     Mental Status: He is alert and oriented to person, place, and time.     No results found for this or any previous visit (from the past 24 hour(s)).  No results found.   ASSESSMENT and PLAN  1. 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  -  Hemoglobin A1c  2. Essential hypertension Controlled. Continue current regime.  - Comprehensive metabolic panel  3. Dyslipidemia Checking labs today, medications will be adjusted as needed.  - Lipid panel  4. Postablative hypothyroidism Checking labs today, medications will be adjusted as needed.  - TSH  5. SCA-3 (spinocerebellar ataxia type 3) ([place]) Progressing? Dysphagia limited to large pills. Discussed cutting/crushing etc. Changing metformin to smaller 516m  Tablet. Not having issues with eating/drinking - postpone speech eval for now. [facility]   6. Screening for prostate cancer - PSA  7. Need for vaccination  Other orders - metFORMIN (GLUCOPHAGE) 500 MG tablet; Take 2 tablets (1,000 mg total) by mouth 2 (two) times daily with a meal.  Return in about 3 months (around 11/22/2019).    IRutherford Guys MD [facility] [place] [address]Ph.  [phone] [phone]

## 2019-08-23 LAB — LIPID PANEL
Chol/HDL Ratio: 3.6 ratio (ref 0.0–5.0)
Cholesterol, Total: 121 mg/dL (ref 100–199)
HDL: 34 mg/dL — ABNORMAL LOW (ref >39)
LDL Chol Calc (NIH): 69 mg/dL (ref 0–99)
Triglycerides: 91 mg/dL (ref 0–149)
VLDL Cholesterol Cal: 18 mg/dL (ref 5–40)

## 2019-08-23 LAB — TSH: TSH: 2.82 u[IU]/mL (ref 0.450–4.500)

## 2019-08-23 LAB — COMPREHENSIVE METABOLIC PANEL
ALT: 24 IU/L (ref 0–44)
AST: 25 IU/L (ref 0–40)
Albumin/Globulin Ratio: 2.2 (ref 1.2–2.2)
Albumin: 4.7 g/dL (ref 3.8–4.9)
Alkaline Phosphatase: 84 IU/L (ref 39–117)
BUN/Creatinine Ratio: 14 (ref 9–20)
BUN: 12 mg/dL (ref 6–24)
Bilirubin Total: 0.4 mg/dL (ref 0.0–1.2)
CO2: 24 mmol/L (ref 20–29)
Calcium: 9.3 mg/dL (ref 8.7–10.2)
Chloride: 98 mmol/L (ref 96–106)
Creatinine, Ser: 0.88 mg/dL (ref 0.76–1.27)
GFR calc Af Amer: 109 mL/min/{1.73_m2} (ref >59)
GFR calc non Af Amer: 94 mL/min/{1.73_m2} (ref >59)
Globulin, Total: 2.1 g/dL (ref 1.5–4.5)
Glucose: 186 mg/dL — ABNORMAL HIGH (ref 65–99)
Potassium: 4 mmol/L (ref 3.5–5.2)
Sodium: 139 mmol/L (ref 134–144)
Total Protein: 6.8 g/dL (ref 6.0–8.5)

## 2019-08-23 LAB — PSA: Prostate Specific Ag, Serum: 1.2 ng/mL (ref 0.0–4.0)

## 2019-08-23 LAB — HEMOGLOBIN A1C
Est. average glucose Bld gHb Est-mCnc: 192 mg/dL
Hgb A1c MFr Bld: 8.3 % — ABNORMAL HIGH (ref 4.8–5.6)

## 2019-09-15 ENCOUNTER — Other Ambulatory Visit: Payer: Self-pay

## 2019-09-15 ENCOUNTER — Telehealth: Payer: Self-pay | Admitting: Family Medicine

## 2019-09-15 DIAGNOSIS — E785 Hyperlipidemia, unspecified: Secondary | ICD-10-CM

## 2019-09-15 MED ORDER — METOPROLOL SUCCINATE ER 100 MG PO TB24: ORAL_TABLET | ORAL | 0 refills | Status: DC

## 2019-09-15 MED ORDER — ATORVASTATIN CALCIUM 80 MG PO TABS: 80.0000 mg | ORAL_TABLET | Freq: Every day | ORAL | 3 refills | Status: DC

## 2019-09-15 NOTE — Telephone Encounter (Signed)
Pt is complrtly put please send refills asap for metoprolol succinate (TOPROL-  Please advise   FR

## 2019-09-18 NOTE — Telephone Encounter (Signed)
Pts metoprolol was just filled on 09/15/2019 with a 90 day supply. No refill is needed at this time. Pk/CMA

## 2019-10-21 ENCOUNTER — Other Ambulatory Visit: Payer: Self-pay | Admitting: Family Medicine

## 2019-10-21 DIAGNOSIS — E785 Hyperlipidemia, unspecified: Secondary | ICD-10-CM

## 2019-10-22 NOTE — Telephone Encounter (Signed)
Please advise if it is ok to refill this medication. You discontinued Atorvastatin 05/22/19. Patient have not been seen in a year but have an upcoming appt with you ob 2/12/2

## 2019-10-23 NOTE — Telephone Encounter (Signed)
Atorvastatin increased to 80mg  in July 2020 Last OV nov 2020

## 2019-11-13 ENCOUNTER — Other Ambulatory Visit: Payer: Self-pay | Admitting: Family Medicine

## 2019-11-21 ENCOUNTER — Ambulatory Visit: Payer: Federal, State, Local not specified - PPO | Admitting: Family Medicine

## 2019-11-21 ENCOUNTER — Other Ambulatory Visit: Payer: Self-pay

## 2019-11-21 ENCOUNTER — Encounter: Payer: Self-pay | Admitting: Family Medicine

## 2019-11-21 VITALS — BP 176/90 | HR 84 | Temp 98.4°F | Ht 67.0 in

## 2019-11-21 DIAGNOSIS — Z7409 Other reduced mobility: Secondary | ICD-10-CM

## 2019-11-21 DIAGNOSIS — E113299 Type 2 diabetes mellitus with mild nonproliferative diabetic retinopathy without macular edema, unspecified eye: Secondary | ICD-10-CM | POA: Diagnosis not present

## 2019-11-21 DIAGNOSIS — I10 Essential (primary) hypertension: Secondary | ICD-10-CM | POA: Diagnosis not present

## 2019-11-21 DIAGNOSIS — E785 Hyperlipidemia, unspecified: Secondary | ICD-10-CM | POA: Diagnosis not present

## 2019-11-21 DIAGNOSIS — G118 Other hereditary ataxias: Secondary | ICD-10-CM | POA: Diagnosis not present

## 2019-11-21 MED ORDER — ROSUVASTATIN CALCIUM 5 MG PO TABS: 5.0000 mg | ORAL_TABLET | Freq: Every day | ORAL | 3 refills | Status: DC

## 2019-11-21 NOTE — Progress Notes (Signed)
2/12/20212:29 PM  Randy Ballard 1961/10/04, 59 y.o., male [id number]  Chief Complaint  Patient presents with  . Diabetes    3 m f/u   . Medication Refill    all meds, however for the antrovastatin pt would like to take 2X 40 for the 80 mg since pt is having trouble swallowing pills.     HPI:   Patient is a 59 y.o. male with past medical history significant for DM2, HTN, HLP, GERD, spinocerebellar ataxia 3, hypothyroidismwho presents today for routine followup  Last OV nov 2020 - changed metformin due to dysphagia  Having more difficulty with walking due to leg weakness, uses rollator walker but has had to take more rest breaks when getting the mail.  He has a wheelchair at home but it is too heavy He is able to lift about 10 lbs max  He is having issues with swallowing atorvastatin 87m, they are too large  He does not check cbgs too often He is not eating as many sweats recently Not having any low sugars  Lab Results  Component Value Date   HGBA1C 8.3 (H) 08/22/2019   HGBA1C 8.5 (H) 05/01/2019   HGBA1C 8.4 (A) 12/20/2018   Lab Results  Component Value Date   MICROALBUR 8.9 05/25/2016   LDLCALC 69 08/22/2019   CREATININE 0.88 08/22/2019   Lab Results  Component Value Date   TSH 2.820 08/22/2019    Depression screen PHQ 2/9 11/21/2019 05/01/2019 12/20/2018  Decreased Interest 0 0 0  Down, Depressed, Hopeless 0 0 0  PHQ - 2 Score 0 0 0    Fall Risk  11/21/2019 08/22/2019 05/01/2019 12/20/2018 07/19/2018  Falls in the past year? 0 - 1 0 Yes  Comment - - - - -  Number falls in past yr: 0 - '1 1 1  ' Comment - - - - -  Injury with Fall? 0 - 1 1 (No Data)  Comment - - - r. ribs and face pt states about 1 year ago he hurt his right knee  Risk for fall due to : - Impaired balance/gait;Impaired mobility History of fall(s);Impaired balance/gait;Impaired mobility Impaired balance/gait -  Follow up Falls evaluation completed - Falls evaluation completed Falls evaluation  completed -     Allergies  Allergen Reactions  . Januvia [Sitagliptin] Nausea And Vomiting    pancreatitis    Prior to Admission medications   Medication Sig Start Date End Date Taking? Authorizing Provider  aspirin 81 MG tablet Take 1 tablet (81 mg total) by mouth daily. 12/23/11  Yes RHayden Rasmussen MD  atorvastatin (LIPITOR) 80 MG tablet Take 1 tablet (80 mg total) by mouth daily. 09/15/19  Yes SRutherford Guys MD  Blood Glucose Monitoring Suppl (BLOOD GLUCOSE METER) kit Test blood sugar daily 12/19/13  Yes SShawnee Knapp MD  calcium-vitamin D (OSCAL WITH D) 500-200 MG-UNIT per tablet Take 1 tablet by mouth 2 (two) times daily.   Yes [provider]  Cholecalciferol (VITAMIN D3) 5000 units CAPS Take 1 capsule (5,000 Units total) by mouth daily. 10/25/17  Yes SShawnee Knapp MD  glimepiride (AMARYL) 4 MG tablet Take 2 tablets (8 mg total) by mouth daily before breakfast. 05/01/19  Yes SRutherford Guys MD  glucose blood test strip Test blood sugar daily. 12/19/13  Yes SShawnee Knapp MD  Lancet Devices (LANCING DEVICE) MISC Test blood sugar daily. 12/19/13  Yes SShawnee Knapp MD  levothyroxine (SYNTHROID) 125 MCG tablet TAKE 1 TABLET  BY MOUTH EVERY DAY BEFORE BREAKFAST 11/13/19  Yes Rutherford Guys, MD  lisinopril-hydrochlorothiazide (ZESTORETIC) 20-25 MG tablet TAKE 1 TABLET BY MOUTH EVERY DAY 08/01/19  Yes Rutherford Guys, MD  metFORMIN (GLUCOPHAGE) 500 MG tablet Take 2 tablets (1,000 mg total) by mouth 2 (two) times daily with a meal. 08/22/19  Yes Rutherford Guys, MD  metoprolol succinate (TOPROL-XL) 100 MG 24 hr tablet TAKE 1 TABLET BY MOUTH EVERY DAY IMMEDIATELY FOLLOWING A MEAL 09/15/19  Yes Rutherford Guys, MD    Past Medical History:  Diagnosis Date  . Diabetes mellitus without complication ([place])   . Diverticulitis   . Hyperlipidemia   . Hypertension   . Hypogonadism male   . Neuromuscular disorder ([place])   . SCA-3 (spinocerebellar ataxia type 3) ([place])   . Thyroid disease  02/06/2009   [facility] s/p RAIU; cold nodule s/p negative biopsy    Past Surgical History:  Procedure Laterality Date  . BIOPSY THYROID  02/06/2009   cold thyroid nodule; negative/benign.  . COLONOSCOPY W/ POLYPECTOMY  Feb 2014   5 polyps  . TOOTH EXTRACTION  10/2015   x 4 teeth    Social History   Tobacco Use  . Smoking status: Current Some Day Smoker    Packs/day: 0.50    Years: 32.00    Pack years: 16.00    Types: Cigarettes  . Smokeless tobacco: Never Used  . Tobacco comment: 10/26/15 trying to quit  Substance Use Topics  . Alcohol use: No    Alcohol/week: 0.0 standard drinks    Family History  Adopted: Yes  Problem Relation Age of Onset  . Ataxia Mother 84       SCA3  . Ataxia Maternal Grandmother        SCA3  . Ataxia Maternal Aunt        SCA3    Review of Systems  Constitutional: Negative for chills and fever.  Respiratory: Negative for cough and shortness of breath.   Cardiovascular: Negative for chest pain, palpitations and leg swelling.  Gastrointestinal: Negative for abdominal pain, nausea and vomiting.  Neurological: Positive for focal weakness.     OBJECTIVE:  Today's Vitals   11/21/19 1417  BP: (!) 176/90  Pulse: 84  Temp: 98.4 F (36.9 C)  TempSrc: Temporal  SpO2: 100%  Height: '5\' 7"'  (1.702 m)   Body mass index is 33.36 kg/m.   Physical Exam Vitals and nursing note reviewed.  Constitutional:      Appearance: He is well-developed.  HENT:     Head: Normocephalic and atraumatic.  Eyes:     Conjunctiva/sclera: Conjunctivae normal.     Pupils: Pupils are equal, round, and reactive to light.  Cardiovascular:     Rate and Rhythm: Normal rate and regular rhythm.     Heart sounds: No murmur. No friction rub. No gallop.   Pulmonary:     Effort: Pulmonary effort is normal.     Breath sounds: Normal breath sounds. No wheezing or rales.  Musculoskeletal:     Cervical back: Neck supple.     Right lower leg: No edema.     Left lower leg: No  edema.  Skin:    General: Skin is warm and dry.  Neurological:     Mental Status: He is alert and oriented to person, place, and time.     Motor: Weakness present.     Gait: Gait abnormal.    No results found for this or any previous visit (from  the past 24 hour(s)).  No results found.   ASSESSMENT and PLAN  1. Hyperlipidemia LDL goal <100 - TSH - Lipid panel - CMP14+EGFR  2. 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 Changing atorvastatin to crestor, pill size smaller - TSH - Lipid panel - CMP14+EGFR - Ambulatory referral to Ophthalmology - Hemoglobin A1c  3. Essential hypertension Controlled. Continue current regime.  - TSH - Lipid panel - CMP14+EGFR - Care order/instruction:  4. SCA-3 (spinocerebellar ataxia type 3) ([place]) 5. Impaired mobility Referring to PT for wheelchair eval - Ambulatory referral to Physical Therapy  Other orders - rosuvastatin (CRESTOR) 5 MG tablet; Take 1 tablet (5 mg total) by mouth at bedtime.  Return in about 3 months (around 02/18/2020).    Rutherford Guys, MD Primary Care at [place], [place] [address] Ph.  [phone] Fax [phone]

## 2019-11-21 NOTE — Patient Instructions (Signed)
° ° ° °  If you have lab work done today you will be contacted with your lab results within the next 2 weeks.  If you have not heard from us then please contact us. The fastest way to get your results is to register for My Chart. ° ° °IF you received an x-ray today, you will receive an invoice from [facility]. Please contact [facility] at [phone] with questions or concerns regarding your invoice.  ° °IF you received labwork today, you will receive an invoice from [facility]. Please contact [facility] at [phone] with questions or concerns regarding your invoice.  ° °Our billing staff will not be able to assist you with questions regarding bills from these companies. ° °You will be contacted with the lab results as soon as they are available. The fastest way to get your results is to activate your My Chart account. Instructions are located on the last page of this paperwork. If you have not heard from us regarding the results in 2 weeks, please contact this office. °  ° ° ° °

## 2019-11-22 LAB — HEMOGLOBIN A1C
Est. average glucose Bld gHb Est-mCnc: 192 mg/dL
Hgb A1c MFr Bld: 8.3 % — ABNORMAL HIGH (ref 4.8–5.6)

## 2019-11-22 LAB — LIPID PANEL
Chol/HDL Ratio: 4.7 ratio (ref 0.0–5.0)
Cholesterol, Total: 184 mg/dL (ref 100–199)
HDL: 39 mg/dL — ABNORMAL LOW (ref >39)
LDL Chol Calc (NIH): 120 mg/dL — ABNORMAL HIGH (ref 0–99)
Triglycerides: 138 mg/dL (ref 0–149)
VLDL Cholesterol Cal: 25 mg/dL (ref 5–40)

## 2019-11-22 LAB — CMP14+EGFR
ALT: 27 IU/L (ref 0–44)
AST: 23 IU/L (ref 0–40)
Albumin/Globulin Ratio: 1.6 (ref 1.2–2.2)
Albumin: 4.3 g/dL (ref 3.8–4.9)
Alkaline Phosphatase: 79 IU/L (ref 39–117)
BUN/Creatinine Ratio: 15 (ref 9–20)
BUN: 13 mg/dL (ref 6–24)
Bilirubin Total: 0.3 mg/dL (ref 0.0–1.2)
CO2: 26 mmol/L (ref 20–29)
Calcium: 9.6 mg/dL (ref 8.7–10.2)
Chloride: 98 mmol/L (ref 96–106)
Creatinine, Ser: 0.88 mg/dL (ref 0.76–1.27)
GFR calc Af Amer: 109 mL/min/{1.73_m2} (ref >59)
GFR calc non Af Amer: 94 mL/min/{1.73_m2} (ref >59)
Globulin, Total: 2.7 g/dL (ref 1.5–4.5)
Glucose: 167 mg/dL — ABNORMAL HIGH (ref 65–99)
Potassium: 4.2 mmol/L (ref 3.5–5.2)
Sodium: 138 mmol/L (ref 134–144)
Total Protein: 7 g/dL (ref 6.0–8.5)

## 2019-11-22 LAB — TSH: TSH: 5.15 u[IU]/mL — ABNORMAL HIGH (ref 0.450–4.500)

## 2019-12-06 ENCOUNTER — Other Ambulatory Visit: Payer: Self-pay | Admitting: Family Medicine

## 2019-12-06 MED ORDER — LEVOTHYROXINE SODIUM 150 MCG PO TABS: 150.0000 ug | ORAL_TABLET | Freq: Every day | ORAL | 1 refills | Status: DC

## 2019-12-09 ENCOUNTER — Other Ambulatory Visit: Payer: Self-pay | Admitting: Family Medicine

## 2020-01-30 ENCOUNTER — Other Ambulatory Visit: Payer: Self-pay | Admitting: Family Medicine

## 2020-01-30 DIAGNOSIS — I10 Essential (primary) hypertension: Secondary | ICD-10-CM

## 2020-02-20 ENCOUNTER — Other Ambulatory Visit: Payer: Self-pay

## 2020-02-20 ENCOUNTER — Encounter: Payer: Self-pay | Admitting: Family Medicine

## 2020-02-20 ENCOUNTER — Ambulatory Visit: Payer: Federal, State, Local not specified - PPO | Admitting: Family Medicine

## 2020-02-20 VITALS — BP 157/76 | HR 75 | Temp 98.6°F | Ht 68.0 in | Wt 217.0 lb

## 2020-02-20 DIAGNOSIS — E785 Hyperlipidemia, unspecified: Secondary | ICD-10-CM

## 2020-02-20 DIAGNOSIS — E113299 Type 2 diabetes mellitus with mild nonproliferative diabetic retinopathy without macular edema, unspecified eye: Secondary | ICD-10-CM

## 2020-02-20 DIAGNOSIS — E890 Postprocedural hypothyroidism: Secondary | ICD-10-CM

## 2020-02-20 DIAGNOSIS — G118 Other hereditary ataxias: Secondary | ICD-10-CM

## 2020-02-20 DIAGNOSIS — I10 Essential (primary) hypertension: Secondary | ICD-10-CM | POA: Diagnosis not present

## 2020-02-20 MED ORDER — HYDROCHLOROTHIAZIDE 25 MG PO TABS: 25.0000 mg | ORAL_TABLET | Freq: Every day | ORAL | 1 refills | Status: DC

## 2020-02-20 MED ORDER — LISINOPRIL 40 MG PO TABS: 40.0000 mg | ORAL_TABLET | Freq: Every day | ORAL | 1 refills | Status: DC

## 2020-02-20 NOTE — Patient Instructions (Signed)
° ° ° °  If you have lab work done today you will be contacted with your lab results within the next 2 weeks.  If you have not heard from us then please contact us. The fastest way to get your results is to register for My Chart. ° ° °IF you received an x-ray today, you will receive an invoice from [facility]. Please contact [facility] at [phone] with questions or concerns regarding your invoice.  ° °IF you received labwork today, you will receive an invoice from [facility]. Please contact [facility] at [phone] with questions or concerns regarding your invoice.  ° °Our billing staff will not be able to assist you with questions regarding bills from these companies. ° °You will be contacted with the lab results as soon as they are available. The fastest way to get your results is to activate your My Chart account. Instructions are located on the last page of this paperwork. If you have not heard from us regarding the results in 2 weeks, please contact this office. °  ° ° ° °

## 2020-02-20 NOTE — Progress Notes (Signed)
5/14/20212:34 PM  Randy Ballard 28-Mar-1961, 59 y.o., male [id number]  Chief Complaint  Patient presents with  . Medical Management of Chronic Issues    3 month f/u on hyperlipidemia and diabetes    HPI:   Patient is a 59 y.o. male with past medical history significant for DM2, HTN, HLP, GERD, spinocerebellar ataxia 3, hypothyroidismwho presents today for routine followup  Last OV feb 2021 - changed to crestor smaller pill, increased levo to 118mg, a1c sligthly above goal, referred to PT  Has received phone call from PT to schedule Having no problems with swallowing crestor Changed levo as instructed Checks cbgs rarely Checks BP 2-3 x week, 120/80s, not sure if accurate   His mother recently died He in curious because death certificate mentions [place] He mentions his mother had a slight tremor He has noticed a slight tremor of right hand whenever he gets very tired  Lab Results  Component Value Date   HGBA1C 8.3 (H) 11/21/2019   HGBA1C 8.3 (H) 08/22/2019   HGBA1C 8.5 (H) 05/01/2019   Lab Results  Component Value Date   MICROALBUR 8.9 05/25/2016   LDLCALC 120 (H) 11/21/2019   CREATININE 0.88 11/21/2019   Lab Results  Component Value Date   TSH 5.150 (H) 11/21/2019    Depression screen PHQ 2/9 02/20/2020 11/21/2019 05/01/2019  Decreased Interest 0 0 0  Down, Depressed, Hopeless 0 0 0  PHQ - 2 Score 0 0 0    Fall Risk  02/20/2020 11/21/2019 08/22/2019 05/01/2019 12/20/2018  Falls in the past year? 1 0 - 1 0  Comment - - - - -  Number falls in past yr: 1 0 - 1 1  Comment - - - - -  Injury with Fall? 1 0 - 1 1  Comment - - - - r. ribs and face pt states about 1 year ago  Risk for fall due to : - - Impaired balance/gait;Impaired mobility History of fall(s);Impaired balance/gait;Impaired mobility Impaired balance/gait  Follow up Falls evaluation completed Falls evaluation completed - Falls evaluation completed Falls evaluation completed     Allergies    Allergen Reactions  . Januvia [Sitagliptin] Nausea And Vomiting    pancreatitis    Prior to Admission medications   Medication Sig Start Date End Date Taking? Authorizing Provider  aspirin 81 MG tablet Take 1 tablet (81 mg total) by mouth daily. 12/23/11  Yes RHayden Rasmussen MD  Blood Glucose Monitoring Suppl (BLOOD GLUCOSE METER) kit Test blood sugar daily 12/19/13  Yes SShawnee Knapp MD  calcium-vitamin D (OSCAL WITH D) 500-200 MG-UNIT per tablet Take 1 tablet by mouth 2 (two) times daily.   Yes [provider]  Cholecalciferol (VITAMIN D3) 5000 units CAPS Take 1 capsule (5,000 Units total) by mouth daily. 10/25/17  Yes SShawnee Knapp MD  glimepiride (AMARYL) 4 MG tablet Take 2 tablets (8 mg total) by mouth daily before breakfast. 05/01/19  Yes SRutherford Guys MD  glucose blood test strip Test blood sugar daily. 12/19/13  Yes SShawnee Knapp MD  Lancet Devices (LANCING DEVICE) MISC Test blood sugar daily. 12/19/13  Yes SShawnee Knapp MD  levothyroxine (SYNTHROID) 150 MCG tablet Take 1 tablet (150 mcg total) by mouth daily before breakfast. 12/06/19  Yes SRutherford Guys MD  lisinopril-hydrochlorothiazide (ZESTORETIC) 20-25 MG tablet TAKE 1 TABLET BY MOUTH EVERY DAY 01/30/20  Yes SRutherford Guys MD  metFORMIN (GLUCOPHAGE) 500 MG tablet Take 2 tablets (1,000 mg total) by  mouth 2 (two) times daily with a meal. 08/22/19  Yes Rutherford Guys, MD  metoprolol succinate (TOPROL-XL) 100 MG 24 hr tablet TAKE 1 TABLET BY MOUTH EVERY DAY IMMEDIATELY FOLLOWING A MEAL 12/10/19  Yes Rutherford Guys, MD  rosuvastatin (CRESTOR) 5 MG tablet Take 1 tablet (5 mg total) by mouth at bedtime. 11/21/19  Yes Rutherford Guys, MD    Past Medical History:  Diagnosis Date  . Diabetes mellitus without complication ([place])   . Diverticulitis   . Hyperlipidemia   . Hypertension   . Hypogonadism male   . Neuromuscular disorder ([place])   . SCA-3 (spinocerebellar ataxia type 3) ([place])   . Thyroid disease 02/06/2009    [place] s/p RAIU; cold nodule s/p negative biopsy    Past Surgical History:  Procedure Laterality Date  . BIOPSY THYROID  02/06/2009   cold thyroid nodule; negative/benign.  . COLONOSCOPY W/ POLYPECTOMY  Feb 2014   5 polyps  . TOOTH EXTRACTION  10/2015   x 4 teeth    Social History   Tobacco Use  . Smoking status: Current Some Day Smoker    Packs/day: 0.50    Years: 32.00    Pack years: 16.00    Types: Cigarettes  . Smokeless tobacco: Never Used  . Tobacco comment: 10/26/15 trying to quit  Substance Use Topics  . Alcohol use: No    Alcohol/week: 0.0 standard drinks    Family History  Adopted: Yes  Problem Relation Age of Onset  . Ataxia Mother 13       SCA3  . Ataxia Maternal Grandmother        SCA3  . Ataxia Maternal Aunt        SCA3    Review of Systems  Constitutional: Negative for chills and fever.  Respiratory: Negative for cough and shortness of breath.   Cardiovascular: Negative for chest pain, palpitations and leg swelling.  Gastrointestinal: Negative for abdominal pain, nausea and vomiting.     OBJECTIVE:  Today's Vitals   02/20/20 1414 02/20/20 1425  BP: (!) 181/79 (!) 146/80  Pulse: 75   Temp: 98.6 F (37 C)   TempSrc: Temporal   SpO2: 98%   Weight: 217 lb (98.4 kg)   Height: '5\' 8"'  (1.727 m)    Body mass index is 32.99 kg/m.    Patient struggles with accurate weight given ataxia  BP Readings from Last 3 Encounters:  02/20/20 (!) 146/80  11/21/19 (!) 176/90  08/22/19 118/80     Physical Exam Vitals and nursing note reviewed.  Constitutional:      Appearance: He is well-developed.  HENT:     Head: Normocephalic and atraumatic.  Eyes:     Conjunctiva/sclera: Conjunctivae normal.     Pupils: Pupils are equal, round, and reactive to light.  Cardiovascular:     Rate and Rhythm: Normal rate and regular rhythm.     Heart sounds: No murmur. No friction rub. No gallop.   Pulmonary:     Effort: Pulmonary effort is normal.     Breath  sounds: Normal breath sounds. No wheezing or rales.  Musculoskeletal:     Cervical back: Neck supple.     Right lower leg: Edema (trace pitting edema) present.     Left lower leg: Edema present.  Skin:    General: Skin is warm and dry.  Neurological:     Mental Status: He is alert and oriented to person, place, and time.     No results found  for this or any previous visit (from the past 24 hour(s)).  No results found.  Diabetic Foot Form - Detailed   Diabetic Foot Exam - detailed Diabetic Foot exam was performed with the following findings: Yes 02/20/2020  2:19 PM  Visual Foot Exam completed.: Yes  Can the patient see the bottom of their feet?: No Are the shoes appropriate in style and fit?: Yes Is there swelling or and abnormal foot shape?: Yes Is there a claw toe deformity?: No Is there elevated skin temparature?: No Is there foot or ankle muscle weakness?: Yes Normal Range of Motion: No Pulse Foot Exam completed.: Yes  Sensory Foot Exam Completed.: Yes Semmes-Weinstein Monofilament Test      ASSESSMENT and PLAN  1. Essential hypertension Not controlled. Increasing lisinopril to 65m daily. Cont home BP monitoring - Comprehensive metabolic panel - hydrochlorothiazide (HYDRODIURIL) 25 MG tablet; Take 1 tablet (25 mg total) by mouth daily.  2. Postablative hypothyroidism Checking labs today, medications will be adjusted as needed.  - TSH  3. Dyslipidemia Checking labs today, medications will be adjusted as needed.  - Lipid Panel  4. 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  - HM Diabetes Foot Exam - Hemoglobin A1c  5. SCA-3 ([place]reb[place] 3) [place] PT evaluation for wheelchair. Declines neuro referral at this time.  Other orders - lisinopril  (ZESTRIL) 40 MG tablet; Take 1 tablet (40 mg total) by mouth daily.  Return in about 3 months (around 05/22/2020).    IRutherford Guys MD [facility] [place] [id number].  [phone] [phone]

## 2020-02-21 LAB — COMPREHENSIVE METABOLIC PANEL
ALT: 26 IU/L (ref 0–44)
AST: 24 IU/L (ref 0–40)
Albumin/Globulin Ratio: 1.6 (ref 1.2–2.2)
Albumin: 4.5 g/dL (ref 3.8–4.9)
Alkaline Phosphatase: 69 IU/L (ref 39–117)
BUN/Creatinine Ratio: 17 (ref 10–24)
BUN: 14 mg/dL (ref 8–27)
Bilirubin Total: 0.4 mg/dL (ref 0.0–1.2)
CO2: 26 mmol/L (ref 20–29)
Calcium: 10 mg/dL (ref 8.6–10.2)
Chloride: 100 mmol/L (ref 96–106)
Creatinine, Ser: 0.84 mg/dL (ref 0.76–1.27)
GFR calc Af Amer: 110 mL/min/{1.73_m2} (ref >59)
GFR calc non Af Amer: 95 mL/min/{1.73_m2} (ref >59)
Globulin, Total: 2.8 g/dL (ref 1.5–4.5)
Glucose: 102 mg/dL — ABNORMAL HIGH (ref 65–99)
Potassium: 4 mmol/L (ref 3.5–5.2)
Sodium: 140 mmol/L (ref 134–144)
Total Protein: 7.3 g/dL (ref 6.0–8.5)

## 2020-02-21 LAB — LIPID PANEL
Chol/HDL Ratio: 4 ratio (ref 0.0–5.0)
Cholesterol, Total: 137 mg/dL (ref 100–199)
HDL: 34 mg/dL — ABNORMAL LOW (ref >39)
LDL Chol Calc (NIH): 78 mg/dL (ref 0–99)
Triglycerides: 138 mg/dL (ref 0–149)
VLDL Cholesterol Cal: 25 mg/dL (ref 5–40)

## 2020-02-21 LAB — HEMOGLOBIN A1C
Est. average glucose Bld gHb Est-mCnc: 177 mg/dL
Hgb A1c MFr Bld: 7.8 % — ABNORMAL HIGH (ref 4.8–5.6)

## 2020-02-21 LAB — TSH: TSH: 1.29 u[IU]/mL (ref 0.450–4.500)

## 2020-02-23 ENCOUNTER — Other Ambulatory Visit: Payer: Self-pay | Admitting: Family Medicine

## 2020-02-23 NOTE — Telephone Encounter (Signed)
Requested medication (s) are due for refill today: yes  Requested medication (s) are on the active medication list: yes  Last refill:  11/13/19  Future visit scheduled: no  Notes to clinic:  med still on active med list- TSH normal at last check   Requested Prescriptions  Pending Prescriptions Disp Refills   levothyroxine (SYNTHROID) 125 MCG tablet [Pharmacy Med Name: LEVOTHYROXINE 0.125MG  ( ) TAB] 90 tablet 0    Sig: TAKE 1 TABLET BY MOUTH EVERY DAY BEFORE BREAKFAST      Endocrinology:  Hypothyroid Agents Failed - 02/23/2020 11:39 AM      Failed - TSH needs to be rechecked within 3 months after an abnormal result. Refill until TSH is due.      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

## 2020-03-08 ENCOUNTER — Other Ambulatory Visit: Payer: Self-pay | Admitting: Family Medicine

## 2020-03-25 ENCOUNTER — Other Ambulatory Visit: Payer: Self-pay | Admitting: Family Medicine

## 2020-05-01 ENCOUNTER — Other Ambulatory Visit: Payer: Self-pay | Admitting: Family Medicine

## 2020-05-01 NOTE — Telephone Encounter (Signed)
Requested medication (s) are due for refill today: yes  Requested medication (s) are on the active medication list: yes  Last refill:  05/01/19  Future visit scheduled: no  Notes to clinic:  due for BW    Requested Prescriptions  Pending Prescriptions Disp Refills   glimepiride (AMARYL) 4 MG tablet [Pharmacy Med Name: GLIMEPIRIDE 4MG  TABLETS] 180 tablet 3    Sig: TAKE 2 TABLETS(8 MG) BY MOUTH DAILY BEFORE BREAKFAST      Endocrinology:  Diabetes - Sulfonylureas Passed - 05/01/2020  1:29 PM      Passed - HBA1C is between 0 and 7.9 and within 180 days    Hgb A1c MFr Bld  Date Value Ref Range Status  02/20/2020 7.8 (H) 4.8 - 5.6 % Final    Comment:             Prediabetes: 5.7 - 6.4          Diabetes: >6.4          Glycemic control for adults with diabetes: <7.0           Passed - Valid encounter within last 6 months    Recent Outpatient Visits           2 months ago Essential hypertension   [facility] at 02/22/2020, [facility], MD   5 months ago Hyperlipidemia LDL goal <100   [facility], [facility], MD   8 months ago 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, without long-term current use of insulin, macular edema presence unspecified, unspecified laterality [facility])   [facility] at [facility], Oneita Jolly, MD   1 year ago Type 2 diabetes mellitus with diabetic polyneuropathy, without long-term current use of insulin [facility])   [facility] at [facility], [facility], MD   1 year ago Type 2 diabetes mellitus with diabetic polyneuropathy, without long-term current use of insulin [facility])   [facility] at [facility], [facility], MD

## 2020-05-03 NOTE — Telephone Encounter (Signed)
Recent visit. rx refilled

## 2020-06-05 ENCOUNTER — Other Ambulatory Visit: Payer: Self-pay | Admitting: Family Medicine

## 2020-06-05 NOTE — Telephone Encounter (Signed)
Requested Prescriptions  Pending Prescriptions Disp Refills   levothyroxine (SYNTHROID) 150 MCG tablet [Pharmacy Med Name: LEVOTHYROXINE 0.150MG  ( ) TAB] 90 tablet 3    Sig: TAKE 1 TABLET(150 MCG) BY MOUTH DAILY BEFORE BREAKFAST     Endocrinology:  Hypothyroid Agents Failed - 06/05/2020  9:34 AM      Failed - TSH needs to be rechecked within 3 months after an abnormal result. Refill until TSH is due.      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

## 2020-06-07 ENCOUNTER — Other Ambulatory Visit: Payer: Self-pay | Admitting: Family Medicine

## 2020-06-07 NOTE — Telephone Encounter (Signed)
Requested Prescriptions  Pending Prescriptions Disp Refills  . metoprolol succinate (TOPROL-XL) 100 MG 24 hr tablet [Pharmacy Med Name: METOPROLOL ER SUCCINATE 100MG  TABS] 30 tablet 0    Sig: TAKE 1 TABLET BY MOUTH EVERY DAY IMMEDIATELY FOLLOWING A MEAL     Cardiovascular:  Beta Blockers Failed - 06/07/2020  7:35 PM      Failed - Last BP in normal range    BP Readings from Last 1 Encounters:  02/20/20 (!) 157/76         Passed - Last Heart Rate in normal range    Pulse Readings from Last 1 Encounters:  02/20/20 75         Passed - Valid encounter within last 6 months    Recent Outpatient Visits          3 months ago Essential hypertension   [facility] at 02/22/20, [facility], MD   6 months ago Hyperlipidemia LDL goal <100   [facility], [facility], MD   9 months ago 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, without long-term current use of insulin, macular edema presence unspecified, unspecified laterality [facility])   [facility] at [facility], [facility], MD   1 year ago Type 2 diabetes mellitus with diabetic polyneuropathy, without long-term current use of insulin [facility])   [facility] at [facility], [facility], MD   1 year ago Type 2 diabetes mellitus with diabetic polyneuropathy, without long-term current use of insulin [facility])   [facility] at [facility], [facility], MD              Pt. Needs to schedule 3 mo. F/u appt.; 30 day courtesy refill given at this time.

## 2020-06-08 ENCOUNTER — Other Ambulatory Visit: Payer: Self-pay | Admitting: Family Medicine

## 2020-06-08 NOTE — Telephone Encounter (Signed)
° °  Notes to clinic:  Patient requests 90 days supply   Requested Prescriptions  Pending Prescriptions Disp Refills   metoprolol succinate (TOPROL-XL) 100 MG 24 hr tablet [Pharmacy Med Name: METOPROLOL ER SUCCINATE 100MG  TABS] 90 tablet     Sig: TAKE 1 TABLET BY MOUTH ONCE DAILY IMMEDIATELY FOLLOWING A MEAL      Cardiovascular:  Beta Blockers Failed - 06/08/2020  9:30 AM      Failed - Last BP in normal range    BP Readings from Last 1 Encounters:  02/20/20 (!) 157/76          Passed - Last Heart Rate in normal range    Pulse Readings from Last 1 Encounters:  02/20/20 75          Passed - Valid encounter within last 6 months    Recent Outpatient Visits           3 months ago Essential hypertension   Primary Care at 02/22/20, [facility], MD   6 months ago Hyperlipidemia LDL goal <100   [facility], [facility], MD   9 months ago 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, without long-term current use of insulin, macular edema presence unspecified, unspecified laterality [facility])   [facility] at [facility], [facility], MD   1 year ago Type 2 diabetes mellitus with diabetic polyneuropathy, without long-term current use of insulin [facility])   [facility] at [facility], [facility], MD   1 year ago Type 2 diabetes mellitus with diabetic polyneuropathy, without long-term current use of insulin [facility])   [facility] at [facility], [facility], MD

## 2020-07-29 DIAGNOSIS — H259 Unspecified age-related cataract: Secondary | ICD-10-CM | POA: Diagnosis not present

## 2020-07-29 DIAGNOSIS — H04123 Dry eye syndrome of bilateral lacrimal glands: Secondary | ICD-10-CM | POA: Diagnosis not present

## 2020-07-29 DIAGNOSIS — Z135 Encounter for screening for eye and ear disorders: Secondary | ICD-10-CM | POA: Diagnosis not present

## 2020-09-15 ENCOUNTER — Other Ambulatory Visit: Payer: Self-pay

## 2020-09-15 ENCOUNTER — Telehealth: Payer: Self-pay | Admitting: Registered Nurse

## 2020-09-15 DIAGNOSIS — I10 Essential (primary) hypertension: Secondary | ICD-10-CM

## 2020-09-15 MED ORDER — METOPROLOL SUCCINATE ER 100 MG PO TB24: ORAL_TABLET | ORAL | 0 refills | Status: DC

## 2020-09-15 NOTE — Telephone Encounter (Addendum)
09/15/2020 - PATIENT CALLED TO SAY HE ONLY HAS 1 MORE PILL OF HIS METOPROLOL LEFT. 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 HE HAS AN OFFICE VISIT WITH RICH ON 10/18/20 AND A TRANSFER OF CARE WITH RICH ON 11/12/20. HIS WIFE STATES HE IS HANDICAP AND SHE CAN ONLY BRING HIM TO THE OFFICE ON HER DAYS OFF. (SHE IS ON VACATION THE WEEK OF January 10th).  BEST PHONE [phone] (CELL) MBC

## 2020-10-18 ENCOUNTER — Ambulatory Visit: Payer: Federal, State, Local not specified - PPO | Admitting: Registered Nurse

## 2020-10-18 ENCOUNTER — Encounter: Payer: Self-pay | Admitting: Registered Nurse

## 2020-10-18 ENCOUNTER — Other Ambulatory Visit: Payer: Self-pay

## 2020-10-18 VITALS — BP 152/86 | HR 91 | Temp 97.9°F | Resp 16 | Ht 68.0 in | Wt 217.0 lb

## 2020-10-18 DIAGNOSIS — E113299 Type 2 diabetes mellitus with mild nonproliferative diabetic retinopathy without macular edema, unspecified eye: Secondary | ICD-10-CM | POA: Diagnosis not present

## 2020-10-18 DIAGNOSIS — I10 Essential (primary) hypertension: Secondary | ICD-10-CM

## 2020-10-18 LAB — POCT GLYCOSYLATED HEMOGLOBIN (HGB A1C): Hemoglobin A1C: 8.5 % — AB (ref 4.0–5.6)

## 2020-10-18 MED ORDER — LISINOPRIL 40 MG PO TABS: 40.0000 mg | ORAL_TABLET | Freq: Every day | ORAL | 1 refills | Status: DC

## 2020-10-18 MED ORDER — METFORMIN HCL 500 MG PO TABS: ORAL_TABLET | ORAL | 3 refills | Status: DC

## 2020-10-18 MED ORDER — DULAGLUTIDE 0.75 MG/0.5ML ~~LOC~~ SOAJ: 0.7500 mg | SUBCUTANEOUS | 0 refills | Status: DC

## 2020-10-18 MED ORDER — HYDROCHLOROTHIAZIDE 25 MG PO TABS: 25.0000 mg | ORAL_TABLET | Freq: Every day | ORAL | 1 refills | Status: DC

## 2020-10-18 NOTE — Progress Notes (Signed)
rm   Established Patient Office Visit  Subjective:  Patient ID: Randy Ballard, male    DOB: 01/03/1961  Age: 60 y.o. MRN: [id number]  CC:  Chief Complaint  Patient presents with  . Hypertension    Pt here for hypertension today, feels well but BP is very high pt reports anxious today     HPI Randy Ballard presents for t2dm  Last A1c: 7.8 in Oct 2021 Currently taking: metformin 1040m PO bid ac No new complications Reports good compliance with medications Diet has been steady Exercise habits have been limited   Past Medical History:  Diagnosis Date  . Diabetes mellitus without complication ([place]   . Diverticulitis   . Hyperlipidemia   . Hypertension   . Hypogonadism male   . Neuromuscular disorder ([place]   . SCA-3 (spinocerebellar ataxia type 3) ([facility]   . Thyroid disease 02/06/2009   [place] s/p RAIU; cold nodule s/p negative biopsy    Past Surgical History:  Procedure Laterality Date  . BIOPSY THYROID  02/06/2009   cold thyroid nodule; negative/benign.  . COLONOSCOPY W/ POLYPECTOMY  Feb 2014   5 polyps  . TOOTH EXTRACTION  10/2015   x 4 teeth    Family History  Adopted: Yes  Problem Relation Age of Onset  . Ataxia Mother 565      SCA3  . Ataxia Maternal Grandmother        SCA3  . Ataxia Maternal Aunt        SCA3    Social History   Socioeconomic History  . Marital status: Married    Spouse name: THelene Kelp . Number of children: 0  . Years of education: HS  . Highest education level: Not on file  Occupational History  . Occupation: service rep  . Occupation: retired    [employer] farm manager [employer]  Tobacco Use  . Smoking status: Current Some Day Smoker    Packs/day: 0.50    Years: 32.00    Pack years: 16.00    Types: Cigarettes  . Smokeless tobacco: Never Used  . Tobacco comment: 10/26/15 trying to quit  Vaping Use  . Vaping Use: Never used  Substance and Sexual Activity  . Alcohol use: No    Alcohol/week: 0.0 standard drinks  . Drug use: No  .  Sexual activity: Yes    Comment: 1 partner in last 12 months  Other Topics Concern  . Not on file  Social History Narrative   Marital status:  Married x 20 years; happily.      Children: none      Lives: with wife.      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      Alcohol: None      Drugs: none      Exercise: treadmill sporadic.   Social Determinants of Health   Financial Resource Strain: Not on file  Food Insecurity: Not on file  Transportation Needs: Not on file  Physical Activity: Not on file  Stress: Not on file  Social Connections: Not on file  Intimate Partner Violence: Not on file    Outpatient Medications Prior to Visit  Medication Sig Dispense Refill  . Blood Glucose Monitoring Suppl (BLOOD GLUCOSE METER) kit Test blood sugar daily 1 each 0  . Cholecalciferol (VITAMIN D3) 5000 units CAPS Take 1 capsule (5,000 Units total) by mouth daily.    .Marland Kitchenglimepiride (AMARYL) 4 MG tablet TAKE  2 TABLETS(8 MG) BY MOUTH DAILY BEFORE BREAKFAST 180 tablet 1  . glucose blood test strip Test blood sugar daily. 100 each 3  . [employer] (LANCING DEVICE) MISC Test blood sugar daily. 1 each 0  . levothyroxine (SYNTHROID) 150 MCG tablet TAKE 1 TABLET(150 MCG) BY MOUTH DAILY BEFORE BREAKFAST 90 tablet 3  . metoprolol succinate (TOPROL-XL) 100 MG 24 hr tablet TAKE 1 TABLET BY MOUTH ONCE DAILY IMMEDIATELY FOLLOWING A MEAL 90 tablet 0  . rosuvastatin (CRESTOR) 5 MG tablet Take 1 tablet (5 mg total) by mouth at bedtime. 90 tablet 3  . hydrochlorothiazide (HYDRODIURIL) 25 MG tablet Take 1 tablet (25 mg total) by mouth daily. 90 tablet 1  . lisinopril (ZESTRIL) 40 MG tablet Take 1 tablet (40 mg total) by mouth daily. 90 tablet 1  . metFORMIN (GLUCOPHAGE) 500 MG tablet TAKE 2 TABLETS(1000 MG) BY MOUTH TWICE DAILY WITH A MEAL 180 tablet 3  . aspirin 81 MG tablet Take 1 tablet (81 mg total) by mouth daily. (Patient not taking: Reported on  10/18/2020) 30 tablet 11  . calcium-vitamin D (OSCAL WITH D) 500-200 MG-UNIT per tablet Take 1 tablet by mouth 2 (two) times daily. (Patient not taking: Reported on 10/18/2020)     No facility-administered medications prior to visit.    Allergies  Allergen Reactions  . Januvia [Sitagliptin] Nausea And Vomiting    pancreatitis    ROS Review of Systems  Constitutional: Negative.   HENT: Negative.   Eyes: Negative.   Respiratory: Negative.   Cardiovascular: Negative.   Gastrointestinal: Negative.   Genitourinary: Negative.   Musculoskeletal: Negative.   Skin: Negative.   Neurological: Negative.   Psychiatric/Behavioral: Negative.       Objective:    Physical Exam Constitutional:      General: He is not in acute distress.    Appearance: Normal appearance. He is normal weight. He is not ill-appearing, toxic-appearing or diaphoretic.  Cardiovascular:     Rate and Rhythm: Normal rate and regular rhythm.     Heart sounds: Normal heart sounds. No murmur heard. No friction rub. No gallop.   Pulmonary:     Effort: Pulmonary effort is normal. No respiratory distress.     Breath sounds: Normal breath sounds. No stridor. No wheezing, rhonchi or rales.  Chest:     Chest wall: No tenderness.  Neurological:     General: No focal deficit present.     Mental Status: He is alert and oriented to person, place, and time. Mental status is at baseline.  Psychiatric:        Mood and Affect: Mood normal.        Behavior: Behavior normal.        Thought Content: Thought content normal.        Judgment: Judgment normal.     BP (!) 152/86   Pulse 91   Temp 97.9 F (36.6 C) (Temporal)   Resp 16   Ht '5\' 8"'  (1.727 m)   Wt 217 lb (98.4 kg)   SpO2 100%   BMI 32.99 kg/m  Wt Readings from Last 3 Encounters:  10/18/20 217 lb (98.4 kg)  02/20/20 217 lb (98.4 kg)  08/22/19 213 lb (96.6 kg)     Health Maintenance Due  Topic Date Due  . OPHTHALMOLOGY EXAM  05/20/2018    There are no  preventive care reminders to display for this patient.  Lab Results  Component Value Date   TSH 1.290 02/20/2020   Lab Results  Component Value Date   WBC 8.3 05/01/2019   HGB 13.1 05/01/2019   HCT 37.2 (L) 05/01/2019   MCV 91 05/01/2019   PLT 286 05/01/2019   Lab Results  Component Value Date   NA 140 02/20/2020   K 4.0 02/20/2020   CO2 26 02/20/2020   GLUCOSE 102 (H) 02/20/2020   BUN 14 02/20/2020   CREATININE 0.84 02/20/2020   BILITOT 0.4 02/20/2020   ALKPHOS 69 02/20/2020   AST 24 02/20/2020   ALT 26 02/20/2020   PROT 7.3 02/20/2020   ALBUMIN 4.5 02/20/2020   CALCIUM 10.0 02/20/2020   Lab Results  Component Value Date   CHOL 137 02/20/2020   Lab Results  Component Value Date   HDL 34 (L) 02/20/2020   Lab Results  Component Value Date   LDLCALC 78 02/20/2020   Lab Results  Component Value Date   TRIG 138 02/20/2020   Lab Results  Component Value Date   CHOLHDL 4.0 02/20/2020   Lab Results  Component Value Date   HGBA1C 8.5 (A) 10/18/2020      Assessment & Plan:   Problem List Items Addressed This Visit      Endocrine   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 hydrochlorothiazide (HYDRODIURIL) 25 MG tablet    Sig: Take 1 tablet (25 mg total) by mouth daily.    Dispense:  90 tablet    Refill:  1  . lisinopril (ZESTRIL) 40 MG tablet    Sig: Take 1 tablet (40 mg total) by mouth daily.    Dispense:  90 tablet    Refill:  1  . metFORMIN (GLUCOPHAGE) 500 MG tablet     Sig: TAKE 2 TABLETS(1000 MG) BY MOUTH TWICE DAILY WITH A MEAL    Dispense:  180 tablet    Refill:  3  . Dulaglutide 0.75 MG/0.5ML SOPN    Sig: Inject 0.75 mg into the skin once a week.    Dispense:  6 mL    Refill:  0    Order Specific Question:   Supervising Provider    Answer:   Carlota Raspberry, JEFFREY R [[id number]]    Follow-up: No follow-ups on file.   PLAN  a1c up to 8.5  Unfortunately did not tolerate [place] or farxiga.  Will start trulicity  Return in 3 mo for recheck  Patient encouraged to call clinic with any questions, comments, or concerns.  Maximiano Coss, NP

## 2020-10-18 NOTE — Patient Instructions (Signed)
° ° ° °  If you have lab work done today you will be contacted with your lab results within the next 2 weeks.  If you have not heard from us then please contact us. The fastest way to get your results is to register for My Chart. ° ° °IF you received an x-ray today, you will receive an invoice from [facility]. Please contact [facility] at [phone] with questions or concerns regarding your invoice.  ° °IF you received labwork today, you will receive an invoice from [facility]. Please contact [facility] at [phone] with questions or concerns regarding your invoice.  ° °Our billing staff will not be able to assist you with questions regarding bills from these companies. ° °You will be contacted with the lab results as soon as they are available. The fastest way to get your results is to activate your My Chart account. Instructions are located on the last page of this paperwork. If you have not heard from us regarding the results in 2 weeks, please contact this office. °  ° ° ° °

## 2020-10-19 LAB — LIPID PANEL
Chol/HDL Ratio: 4.3 ratio (ref 0.0–5.0)
Cholesterol, Total: 137 mg/dL (ref 100–199)
HDL: 32 mg/dL — ABNORMAL LOW (ref >39)
LDL Chol Calc (NIH): 76 mg/dL (ref 0–99)
Triglycerides: 167 mg/dL — ABNORMAL HIGH (ref 0–149)
VLDL Cholesterol Cal: 29 mg/dL (ref 5–40)

## 2020-10-19 LAB — TSH: TSH: 0.457 u[IU]/mL (ref 0.450–4.500)

## 2020-10-20 DIAGNOSIS — E113293 Type 2 diabetes mellitus with mild nonproliferative diabetic retinopathy without macular edema, bilateral: Secondary | ICD-10-CM | POA: Diagnosis not present

## 2020-10-20 DIAGNOSIS — H25011 Cortical age-related cataract, right eye: Secondary | ICD-10-CM | POA: Diagnosis not present

## 2020-11-12 ENCOUNTER — Encounter: Payer: Federal, State, Local not specified - PPO | Admitting: Registered Nurse

## 2020-12-10 ENCOUNTER — Encounter: Payer: Self-pay | Admitting: Family

## 2020-12-10 ENCOUNTER — Ambulatory Visit (INDEPENDENT_AMBULATORY_CARE_PROVIDER_SITE_OTHER): Payer: Federal, State, Local not specified - PPO | Admitting: Family

## 2020-12-10 ENCOUNTER — Other Ambulatory Visit: Payer: Self-pay

## 2020-12-10 ENCOUNTER — Ambulatory Visit: Payer: Federal, State, Local not specified - PPO | Admitting: Internal Medicine

## 2020-12-10 DIAGNOSIS — I10 Essential (primary) hypertension: Secondary | ICD-10-CM

## 2020-12-10 DIAGNOSIS — E113299 Type 2 diabetes mellitus with mild nonproliferative diabetic retinopathy without macular edema, unspecified eye: Secondary | ICD-10-CM | POA: Diagnosis not present

## 2020-12-10 DIAGNOSIS — Z7689 Persons encountering health services in other specified circumstances: Secondary | ICD-10-CM

## 2020-12-10 MED ORDER — METOPROLOL SUCCINATE ER 100 MG PO TB24: ORAL_TABLET | ORAL | 0 refills | Status: DC

## 2020-12-10 MED ORDER — GLIMEPIRIDE 4 MG PO TABS: ORAL_TABLET | ORAL | 1 refills | Status: DC

## 2020-12-10 NOTE — Progress Notes (Signed)
Subjective:    Randy Ballard - 60 y.o. male MRN [id number]  Date of birth: 12-13-1960  HPI  Randy Ballard is to establish care. Patient has a PMH significant for hypertension, diabetes type II with ophthalmic manifestations, hypothyroid, spinocerebellar ataxia type 3, dyslipidemia, history of pancreatitis, obesity, and tobacco abuse.   Current issues and/or concerns: 1. HYPERTENSION FOLLOW-UP: Visit 10/18/2020 at Primary Care at [facility] per NP note: Prescribed Hydrochlorothiazide and Lisinopril.  12/10/2020: Currently taking: see medication list Have you taken your blood pressure medication today: []  Yes [x]  No only took Metoprolol today plans to take the other medications when he returns home Med Adherence: [x]  Yes    []  No Medication side effects: []  Yes    [x]  No Adherence with salt restriction (low-salt diet): [x]  Yes    []  No Exercise: Yes [x]  does walk to mailbox and inside of house Home Monitoring?: [x]  Yes    []  No Monitoring Frequency: [x]  Yes    []  No Home BP results range: [x]  Yes 117-135/60's-70's Smoking [x]  Yes, 0.5 pack daily  SOB? []  Yes    [x]  No Chest Pain?: []  Yes    [x]  No Leg swelling?: []  Yes    [x]  No Headaches?: []  Yes    [x]  No Dizziness? []  Yes    [x]  No  2. DIABETES TYPE 2 FOLLOW-UP: Visit 10/18/2020 at [facility] at [facility] per NP note: Prescribed Lisinopril, Metformin, and Trulicity.  12/10/2020: Last A1C: 8.5% on 10/18/2020 Med Adherence:  [x]  Yes    []  No Medication side effects:  []  Yes    [x]  No Home Monitoring?  []  Yes    [x]  No Diet Adherence: [x]  Yes   Exercise: [x]  Yes, walks to the mailbox or inside of the house   ROS per HPI   Health Maintenance:  Health Maintenance Due  Topic Date Due  . OPHTHALMOLOGY EXAM  05/20/2018  . COVID-19 Vaccine (3 - Booster for Pfizer series) 08/10/2020    Past Medical History: Patient Active Problem List   Diagnosis Date Noted  . Tobacco abuse 11/06/2014  . SCA-3 (spinocerebellar ataxia  type 3) (HCC) 12/17/2013  . History of pancreatitis 04/16/2013  . BMI 34.0-34.9,adult 02/05/2012  . HTN (hypertension) 12/23/2011  . Type II diabetes mellitus with ophthalmic manifestations (HCC) 12/23/2011  . Hypothyroid 12/23/2011  . Dyslipidemia 12/23/2011    Social History   reports that he has been smoking cigarettes. He has a 16.00 pack-year smoking history. He has never used smokeless tobacco. He reports that he does not drink alcohol and does not use drugs.   Family History  family history includes Ataxia in his maternal aunt and maternal grandmother; Ataxia (age of onset: 63) in his mother. He was adopted.   Medications: reviewed and updated   Objective:  Physical Exam HENT:     Head: Normocephalic and atraumatic.  Eyes:     Extraocular Movements: Extraocular movements intact.     Pupils: Pupils are equal, round, and reactive to light.  Cardiovascular:     Pulses: Normal pulses.     Heart sounds: Normal heart sounds.  Pulmonary:     Effort: Pulmonary effort is normal.     Breath sounds: Normal breath sounds.  Musculoskeletal:     Cervical back: Normal range of motion and neck supple.  Neurological:     General: No focal deficit present.     Mental Status: He is alert and oriented to person, place, and time.  Psychiatric:  Mood and Affect: Mood normal.        Behavior: Behavior normal.     Assessment & Plan:  1. Encounter to establish care: - Patient presents today to establish care.  - Return for annual physical examination, labs, and health maintenance. Arrive fasting meaning having had no food and/or nothing to drink for at least 8 hours prior to appointment.  Please take scheduled medications as normal.  2. Essential hypertension: - Continue Lisinopril 40 mg daily, Hydrochlorothiazide 25 mg daily, and Metoprolol as prescribed. - Patient already has refills for Lisinopril and Hydrochlorothiazide.  - Counseled on blood pressure goal of less than 130/80,  low-sodium, DASH diet, medication compliance, 150 minutes of moderate intensity exercise per week as tolerated. Discussed medication compliance, adverse effects. - Follow-up with primary provider in 4 to 6 weeks or sooner if needed.  - metoprolol succinate (TOPROL-XL) 100 MG 24 hr tablet; TAKE 1 TABLET BY MOUTH ONCE DAILY IMMEDIATELY FOLLOWING A MEAL  Dispense: 90 tablet; Refill: 0  3. 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 Repeat hemoglobin A1c due April 2022. - Continue Metformin 1000 mg twice daily, Trulicity 0.75 mg injection weekly, and Glimepiride as prescribed.  - Patient already has refills for Metformin and Trulicity. - Discussed the importance of healthy eating habits, low-carbohydrate diet, low-sugar diet, regular aerobic exercise (at least 150 minutes a week as tolerated) and medication compliance to achieve or maintain control of diabetes. - Follow-up with primary provider in 4 to 6 weeks or sooner if needed. - glimepiride (AMARYL) 4 MG tablet; TAKE 2 TABLETS(8 MG) BY MOUTH DAILY BEFORE BREAKFAST  Dispense: 180 tablet; Refill: 1   Patient was given clear instructions to go to Emergency Department or return to medical center if symptoms don't improve, worsen, or new problems develop.The patient verbalized understanding.  I discussed the assessment and treatment plan with the patient. The patient was provided an opportunity to ask questions and all were answered. The patient agreed with the plan and demonstrated an understanding of the instructions.   The patient was advised to call back or seek an in-person evaluation if the symptoms worsen or if the condition fails to improve as anticipated.    [name], NP 12/12/2020, 9:03 AM Primary [facility] at [facility]

## 2020-12-10 NOTE — Patient Instructions (Addendum)
Return for annual physical examination, labs, and health maintenance. Arrive fasting meaning having had no food and/or nothing to drink for at least 8 hours prior to appointment.  Please take scheduled medications as normal.  Diabetes follow-up in April 2022 or sooner if needed.  Thank you for choosing Primary Care at [facility] for your medical home!    Randy Ballard was seen by [name], NP today.   Annia Friendly primary care provider is Amy Jodi Geralds, NP.   For the best care possible,  you should try to see [name], NP whenever you come to clinic.   We look forward to seeing you again soon!  If you have any questions about your visit today,  please call us at [phone]  Or feel free to reach your provider via MyChart.    Type 2 Diabetes Mellitus, Diagnosis, Adult Type 2 diabetes (type 2 diabetes mellitus) is a long-term, or chronic, disease. In type 2 diabetes, one or both of these problems may be present:  The pancreas does not make enough of a hormone called insulin.  Cells in the body do not respond properly to insulin that the body makes (insulin resistance). Normally, insulin allows blood sugar (glucose) to enter cells in the body. The cells use glucose for energy. Insulin resistance or lack of insulin causes excess glucose to build up in the blood instead of going into cells. This causes high blood glucose (hyperglycemia).  What are the causes? The exact cause of type 2 diabetes is not known. What increases the risk? The following factors may make you more likely to develop this condition:  Having a family member with type 2 diabetes.  Being overweight or obese.  Being inactive (sedentary).  Having been diagnosed with insulin resistance.  Having a history of prediabetes, diabetes when you were pregnant (gestational diabetes), or polycystic ovary syndrome (PCOS). What are the signs or symptoms? In the early stage of this condition, you may  not have symptoms. Symptoms develop slowly and may include:  Increased thirst or hunger.  Increased urination.  Unexplained weight loss.  Tiredness (fatigue) or weakness.  Vision changes, such as blurry vision.  Dark patches on the skin. How is this diagnosed? This condition is diagnosed based on your symptoms, your medical history, a physical exam, and your blood glucose level. Your blood glucose may be checked with one or more of the following blood tests:  A fasting blood glucose (FBG) test. You will not be allowed to eat (you will fast) for 8 hours or longer before a blood sample is taken.  A random blood glucose test. This test checks blood glucose at any time of day regardless of when you ate.  An A1C (hemoglobin A1C) blood test. This test provides information about blood glucose levels over the previous 2-3 months.  An oral glucose tolerance test (OGTT). This test measures your blood glucose at two times: ? After fasting. This is your baseline blood glucose level. ? Two hours after drinking a beverage that contains glucose. You may be diagnosed with type 2 diabetes if:  Your fasting blood glucose level is 126 mg/dL (7.0 mmol/L) or higher.  Your random blood glucose level is 200 mg/dL (01.6 mmol/L) or higher.  Your A1C level is 6.5% or higher.  Your oral glucose tolerance test result is higher than 200 mg/dL (01.0 mmol/L). These blood tests may be repeated to confirm your diagnosis.   How is this treated? Your treatment may  be managed by a specialist called an endocrinologist. Type 2 diabetes may be treated by following instructions from your health care provider about:  Making dietary and lifestyle changes. These may include: ? Following a personalized nutrition plan that is developed by a registered dietitian. ? Exercising regularly. ? Finding ways to manage stress.  Checking your blood glucose level as often as told.  Taking diabetes medicines or insulin daily.  This helps to keep your blood glucose levels in the healthy range.  Taking medicines to help prevent complications from diabetes. Medicines may include: ? Aspirin. ? Medicine to lower cholesterol. ? Medicine to control blood pressure. Your health care provider will set treatment goals for you. Your goals will be based on your age, other medical conditions you have, and how you respond to diabetes treatment. Generally, the goal of treatment is to maintain the following blood glucose levels:  Before meals: 80-130 mg/dL (1.6-0.7 mmol/L).  After meals: below 180 mg/dL (10 mmol/L).  A1C level: less than 7%. Follow these instructions at home: Questions to ask your health care provider Consider asking the following questions:  Should I meet with a certified diabetes care and education specialist?  What diabetes medicines do I need, and when should I take them?  What equipment will I need to manage my diabetes at home?  How often do I need to check my blood glucose?  Where can I find a support group for people with diabetes?  What number can I call if I have questions?  When is my next appointment? General instructions  Take over-the-counter and prescription medicines only as told by your health care provider.  Keep all follow-up visits as told by your health care provider. This is important. Where to find more information  American Diabetes Association (ADA): www.diabetes.org  American Association of Diabetes Care and Education Specialists (ADCES): www.diabeteseducator.org  International Diabetes Federation (IDF): DCOnly.dk Contact a health care provider if:  Your blood glucose is at or above 240 mg/dL (37.1 mmol/L) for 2 days in a row.  You have been sick or have had a fever for 2 days or longer, and you are not getting better.  You have any of the following problems for more than 6 hours: ? You cannot eat or drink. ? You have nausea and vomiting. ? You have  diarrhea. Get help right away if:  You have severe hypoglycemia. This means your blood glucose is lower than 54 mg/dL (3.0 mmol/L).  You become confused or you have trouble thinking clearly.  You have difficulty breathing.  You have moderate or large ketone levels in your urine. These symptoms may represent a serious problem that is an emergency. Do not wait to see if the symptoms will go away. Get medical help right away. Call your local emergency services (911 in the [place] Do not drive yourself to the hospital. Summary  Type 2 diabetes (type 2 diabetes mellitus) is a long-term, or chronic, disease. In type 2 diabetes, the pancreas does not make enough of a hormone called insulin, or cells in the body do not respond properly to insulin that the body makes (insulin resistance).  This condition is treated by making dietary and lifestyle changes and taking diabetes medicines or insulin.  Your health care provider will set treatment goals for you. Your goals will be based on your age, other medical conditions you have, and how you respond to diabetes treatment.  Keep all follow-up visits as told by your health care provider. This is  important. This information is not intended to replace advice given to you by your health care provider. Make sure you discuss any questions you have with your health care provider. Document Revised: 04/21/2020 Document Reviewed: 04/21/2020 Elsevier Patient Education  2021 [employer].

## 2020-12-10 NOTE — Progress Notes (Signed)
Establish care Glimepiride and metoprolol  needs refill

## 2021-01-19 DIAGNOSIS — E113213 Type 2 diabetes mellitus with mild nonproliferative diabetic retinopathy with macular edema, bilateral: Secondary | ICD-10-CM | POA: Diagnosis not present

## 2021-03-15 NOTE — Progress Notes (Signed)
Patient ID: Randy Ballard, male    DOB: 09/01/1961  MRN: [id number]  CC: Annual Physical Exam   Subjective: Randy Ballard is a 60 y.o. male who presents for annual physical exam. Patient with history of spinocerebellar ataxia type 3 and mostly wheelchair bound.   His concerns today include:   1. HYPERTENSION FOLLOW-UP: 12/10/2020: - Continue Lisinopril 40 mg daily, Hydrochlorothiazide 25 mg daily, and Metoprolol as prescribed. - Follow-up with primary provider in 4 to 6 weeks or sooner if needed.   03/16/2021:  Currently taking: see medication list  Med Adherence: '[x]'  Yes    '[]'  No Medication side effects: '[]'  Yes    '[x]'  No SOB? '[]'  Yes    '[x]'  No Chest Pain?: '[]'  Yes    '[x]'  No  2. DIABETES TYPE 2 FOLLOW-UP: 12/10/2020: - Last hemoglobin A1c not at goal at 8.5% on 10/18/2020. Repeat hemoglobin A1c due April 2022. - Continue Metformin 1000 mg twice daily, Trulicity 0.86 mg injection weekly, and Glimepiride as prescribed.  - Follow-up with primary provider in 4 to 6 weeks or sooner if needed.  03/16/2021:   Med Adherence:  '[x]'  Yes    '[]'  No Medication side effects:  '[]'  Yes    '[x]'  No  Patient Active Problem List   Diagnosis Date Noted   Colon cancer screening 03/16/2021   Anemia 03/16/2021   Tobacco abuse 11/06/2014   SCA-3 (spinocerebellar ataxia type 3) ([place]) 12/17/2013   History of pancreatitis 04/16/2013   HTN (hypertension) 12/23/2011   Type II diabetes mellitus with ophthalmic manifestations ([place]) 12/23/2011   Hypothyroid 12/23/2011   Dyslipidemia 12/23/2011     Current Outpatient Medications on File Prior to Visit  Medication Sig Dispense Refill   Cholecalciferol (VITAMIN D3) 5000 units CAPS Take 1 capsule (5,000 Units total) by mouth daily.     hydrochlorothiazide (HYDRODIURIL) 25 MG tablet Take 1 tablet (25 mg total) by mouth daily. 90 tablet 1   levothyroxine (SYNTHROID) 150 MCG tablet TAKE 1 TABLET(150 MCG) BY MOUTH DAILY BEFORE BREAKFAST 90 tablet 3   lisinopril  (ZESTRIL) 40 MG tablet Take 1 tablet (40 mg total) by mouth daily. 90 tablet 1   metFORMIN (GLUCOPHAGE) 500 MG tablet TAKE 2 TABLETS(1000 MG) BY MOUTH TWICE DAILY WITH A MEAL 180 tablet 3   Blood Glucose Monitoring Suppl (BLOOD GLUCOSE METER) kit Test blood sugar daily 1 each 0   glucose blood test strip Test blood sugar daily. 100 each 3   Lancet Devices (LANCING DEVICE) MISC Test blood sugar daily. 1 each 0   No current facility-administered medications on file prior to visit.    Allergies  Allergen Reactions   Januvia [Sitagliptin] Nausea And Vomiting    pancreatitis    Social History   Socioeconomic History   Marital status: Married    Spouse name: Helene Kelp   Number of children: 0   Years of education: HS   Highest education level: Not on file  Occupational History   Occupation: service rep   Occupation: retired    Fish farm manager: Mus[employer]  Tobacco Use   Smoking status: Some Days    Packs/day: 0.50    Years: 32.00    Pack years: 16.00    Types: Cigarettes   Smokeless tobacco: Never   Tobacco comments:    10/26/15 trying to quit  Vaping Use   Vaping Use: Never used  Substance and Sexual Activity   Alcohol use: No    Alcohol/week: 0.0 standard drinks   Drug use:  No   Sexual activity: Yes    Comment: 1 partner in last 12 months  Other Topics Concern   Not on file  Social History Narrative   Marital status:  Married x 20 years; happily.      Children: none      Lives: with wife.      Employment: delivers oxygen; works for [employer]; drives locally      Tobacco: trying to quit 10/2012.      Alcohol: None      Drugs: none      Exercise: treadmill sporadic.   Social Determinants of Health   Financial Resource Strain: Not on file  Food Insecurity: Not on file  Transportation Needs: Not on file  Physical Activity: Not on file  Stress: Not on file  Social Connections: Not on file  Intimate Partner Violence: Not on file    Family History  Adopted: Yes  Problem  Relation Age of Onset   Ataxia Mother 52       SCA3   Ataxia Maternal Grandmother        SCA3   Ataxia Maternal Aunt        SCA3    Past Surgical History:  Procedure Laterality Date   BIOPSY THYROID  02/06/2009   cold thyroid nodule; negative/benign.   COLONOSCOPY W/ POLYPECTOMY  Feb 2014   5 polyps   TOOTH EXTRACTION  10/2015   x 4 teeth    ROS: Review of Systems Negative except as stated above  PHYSICAL EXAM: BP 126/79 (BP Location: Right Arm, Patient Position: Sitting, Cuff Size: Normal)   Pulse 74   Temp 98.1 F (36.7 C)   Resp 15   SpO2 99%   Physical Exam General appearance - alert, well appearing, and in no distress and oriented to person, place, and time Mental status - alert, oriented to person, place, and time, normal mood, behavior, speech, dress, motor activity, and thought processes Eyes - pupils equal and reactive, extraocular eye movements intact Ears - bilateral TM's and external ear canals normal Nose - normal and patent, no erythema, discharge or polyps Mouth - mucous membranes moist, pharynx normal without lesions Neck - supple, no significant adenopathy Lymphatics - no palpable lymphadenopathy, no hepatosplenomegaly Chest - clear to auscultation, no wheezes, rales or rhonchi, symmetric air entry, no tachypnea, retractions or cyanosis Heart - normal rate, regular rhythm, normal S1, S2, no murmurs, rubs, clicks or gallops Abdomen - soft, nontender, nondistended, no masses or organomegaly GU Male - patient declined examination Neurological - alert, oriented, normal speech, no focal findings or movement disorder noted, motor and sensory grossly normal bilaterally, normal muscle tone, no tremors, strength 4/5 (patient with history of spinocerebellar ataxia type 3 and mostly wheelchair bound) Musculoskeletal - no joint tenderness, deformity or swelling Extremities - peripheral pulses normal Skin - normal coloration and turgor, no rashes, no suspicious skin  lesions noted   ASSESSMENT AND PLAN: 1. Annual physical exam: - Counseled on 150 minutes of exercise per week as tolerated, healthy eating (including decreased daily intake of saturated fats, cholesterol, added sugars, sodium), STI prevention, and routine healthcare maintenance.  2. Screening for metabolic disorder: - CMP to check kidney function, liver function, and electrolyte balance.  - Comprehensive metabolic panel  3. Screening for deficiency anemia: - CBC to screen for anemia. - CBC  4. Thyroid disorder screen: - TSH to check thyroid function.  - TSH  5. Essential hypertension: - Blood pressure at goal today in office.  -  Level of blood pressure control unknown as patient does not consistently monitor at home.  - Continue Metoprolol Succinate as prescribed.  - Counseled on blood pressure goal of less than 130/80, low-sodium, DASH diet, medication compliance, 150 minutes of moderate intensity exercise per week as tolerated. Discussed medication compliance, adverse effects. - Follow-up with primary provider in 3 months or sooner if needed.  - metoprolol succinate (TOPROL-XL) 100 MG 24 hr tablet; TAKE 1 TABLET BY MOUTH ONCE DAILY IMMEDIATELY FOLLOWING A MEAL  Dispense: 90 tablet; Refill: 0  6. 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  - Continue Glimepiride, Metformin, and Dulaglutide as prescribed.  - Discussed the importance of healthy eating habits, low-carbohydrate diet, low-sugar diet, regular aerobic exercise (at least 150 minutes a week as tolerated) and medication compliance to achieve or maintain control of diabetes. - Follow-up with primary provider as scheduled. - Hemoglobin A1c - glimepiride (AMARYL) 4 MG tablet; TAKE 2 TABLETS(8 MG) BY MOUTH DAILY BEFORE BREAKFAST  Dispense: 180  tablet; Refill: 0 - Dulaglutide (TRULICITY) 1.5 EV/0.3JK SOPN; Inject 1.5 mg into the skin once a week.  Dispense: 6 mL; Refill: 0 - Insulin Pen Needle (PEN NEEDLES) 31G X 8 MM MISC; UAD  Dispense: 100 each; Refill: 6  7. Diabetic eye exam [facility]): - Patient reports up-to-date on eye exam.   8. Dyslipidemia: -Practice low-fat heart healthy diet and at least 150 minutes of moderate intensity exercise weekly as tolerated.  - Continue Rosuvastatin as prescribed.  - Lipid panel to screen for high cholesterol.  - Follow-up with primary provider as scheduled.  - Lipid panel - rosuvastatin (CRESTOR) 5 MG tablet; Take 1 tablet (5 mg total) by mouth at bedtime.  Dispense: 90 tablet; Refill: 0  9. Encounter for vitamin deficiency screening: - Vitamin D, 25-hydroxy to screen for deficiency. - Vitamin D, 25-hydroxy   Patient was given the opportunity to ask questions.  Patient verbalized understanding of the plan and was able to repeat key elements of the plan. Patient was given clear instructions to go to Emergency Department or return to medical center if symptoms don't improve, worsen, or new problems develop.The patient verbalized understanding.   Orders Placed This Encounter  Procedures   CBC   Comprehensive metabolic panel   Lipid panel   TSH   Hemoglobin A1c   Vitamin D, 25-hydroxy   Ambulatory referral to Ophthalmology     Requested Prescriptions   Signed Prescriptions Disp Refills   glimepiride (AMARYL) 4 MG tablet 180 tablet 0    Sig: TAKE 2 TABLETS(8 MG) BY MOUTH DAILY BEFORE BREAKFAST   metoprolol succinate (TOPROL-XL) 100 MG 24 hr tablet 90 tablet 0    Sig: TAKE 1 TABLET BY MOUTH ONCE DAILY IMMEDIATELY FOLLOWING A MEAL   Dulaglutide (TRULICITY) 1.5 KX/3.8HW SOPN 6 mL 0    Sig: Inject 1.5 mg into the skin once a week.   rosuvastatin (CRESTOR) 5 MG tablet 90 tablet 0    Sig: Take 1 tablet (5 mg total) by mouth at bedtime.   Insulin Pen Needle (PEN NEEDLES) 31G X 8 MM MISC  100 each 6    Sig: UAD    Follow-up with primary provider in 3 months or sooner if needed.   Camillia Herter, NP

## 2021-03-16 ENCOUNTER — Ambulatory Visit (INDEPENDENT_AMBULATORY_CARE_PROVIDER_SITE_OTHER): Payer: Federal, State, Local not specified - PPO | Admitting: Family

## 2021-03-16 ENCOUNTER — Other Ambulatory Visit: Payer: Self-pay

## 2021-03-16 VITALS — BP 126/79 | HR 74 | Temp 98.1°F | Resp 15

## 2021-03-16 DIAGNOSIS — Z1211 Encounter for screening for malignant neoplasm of colon: Secondary | ICD-10-CM | POA: Insufficient documentation

## 2021-03-16 DIAGNOSIS — Z1329 Encounter for screening for other suspected endocrine disorder: Secondary | ICD-10-CM

## 2021-03-16 DIAGNOSIS — Z0000 Encounter for general adult medical examination without abnormal findings: Secondary | ICD-10-CM | POA: Diagnosis not present

## 2021-03-16 DIAGNOSIS — D649 Anemia, unspecified: Secondary | ICD-10-CM | POA: Insufficient documentation

## 2021-03-16 DIAGNOSIS — E785 Hyperlipidemia, unspecified: Secondary | ICD-10-CM | POA: Diagnosis not present

## 2021-03-16 DIAGNOSIS — I10 Essential (primary) hypertension: Secondary | ICD-10-CM

## 2021-03-16 DIAGNOSIS — Z1321 Encounter for screening for nutritional disorder: Secondary | ICD-10-CM

## 2021-03-16 DIAGNOSIS — E113299 Type 2 diabetes mellitus with mild nonproliferative diabetic retinopathy without macular edema, unspecified eye: Secondary | ICD-10-CM

## 2021-03-16 DIAGNOSIS — Z0100 Encounter for examination of eyes and vision without abnormal findings: Secondary | ICD-10-CM

## 2021-03-16 DIAGNOSIS — Z13228 Encounter for screening for other metabolic disorders: Secondary | ICD-10-CM | POA: Diagnosis not present

## 2021-03-16 DIAGNOSIS — Z130 Encounter for screening for diseases of the blood and blood-forming organs and certain disorders involving the immune mechanism: Secondary | ICD-10-CM

## 2021-03-16 DIAGNOSIS — E119 Type 2 diabetes mellitus without complications: Secondary | ICD-10-CM

## 2021-03-16 MED ORDER — METOPROLOL SUCCINATE ER 100 MG PO TB24: ORAL_TABLET | ORAL | 0 refills | Status: DC

## 2021-03-16 MED ORDER — GLIMEPIRIDE 4 MG PO TABS: ORAL_TABLET | ORAL | 0 refills | Status: DC

## 2021-03-16 NOTE — Progress Notes (Signed)
Annual physical exam  Pt wants to get iron checked experiencing fatigue Needs refill glimepiride and metoprolol

## 2021-03-17 ENCOUNTER — Other Ambulatory Visit: Payer: Self-pay | Admitting: Family

## 2021-03-17 DIAGNOSIS — E113299 Type 2 diabetes mellitus with mild nonproliferative diabetic retinopathy without macular edema, unspecified eye: Secondary | ICD-10-CM

## 2021-03-17 DIAGNOSIS — I10 Essential (primary) hypertension: Secondary | ICD-10-CM

## 2021-03-17 DIAGNOSIS — E785 Hyperlipidemia, unspecified: Secondary | ICD-10-CM

## 2021-03-17 LAB — COMPREHENSIVE METABOLIC PANEL
ALT: 33 IU/L (ref 0–44)
AST: 27 IU/L (ref 0–40)
Albumin/Globulin Ratio: 1.5 (ref 1.2–2.2)
Albumin: 4.6 g/dL (ref 3.8–4.8)
Alkaline Phosphatase: 86 IU/L (ref 44–121)
BUN/Creatinine Ratio: 14 (ref 10–24)
BUN: 13 mg/dL (ref 8–27)
Bilirubin Total: 0.4 mg/dL (ref 0.0–1.2)
CO2: 25 mmol/L (ref 20–29)
Calcium: 9.5 mg/dL (ref 8.6–10.2)
Chloride: 96 mmol/L (ref 96–106)
Creatinine, Ser: 0.92 mg/dL (ref 0.76–1.27)
Globulin, Total: 3 g/dL (ref 1.5–4.5)
Glucose: 211 mg/dL — ABNORMAL HIGH (ref 65–99)
Potassium: 4.4 mmol/L (ref 3.5–5.2)
Sodium: 137 mmol/L (ref 134–144)
Total Protein: 7.6 g/dL (ref 6.0–8.5)
eGFR: 95 mL/min/{1.73_m2} (ref >59)

## 2021-03-17 LAB — LIPID PANEL
Chol/HDL Ratio: 4.4 ratio (ref 0.0–5.0)
Cholesterol, Total: 158 mg/dL (ref 100–199)
HDL: 36 mg/dL — ABNORMAL LOW (ref >39)
LDL Chol Calc (NIH): 86 mg/dL (ref 0–99)
Triglycerides: 216 mg/dL — ABNORMAL HIGH (ref 0–149)
VLDL Cholesterol Cal: 36 mg/dL (ref 5–40)

## 2021-03-17 LAB — VITAMIN D 25 HYDROXY (VIT D DEFICIENCY, FRACTURES): Vit D, 25-Hydroxy: 37.3 ng/mL (ref 30.0–100.0)

## 2021-03-17 LAB — HEMOGLOBIN A1C
Est. average glucose Bld gHb Est-mCnc: 203 mg/dL
Hgb A1c MFr Bld: 8.7 % — ABNORMAL HIGH (ref 4.8–5.6)

## 2021-03-17 LAB — CBC
Hematocrit: 37.9 % (ref 37.5–51.0)
Hemoglobin: 12.4 g/dL — ABNORMAL LOW (ref 13.0–17.7)
MCH: 30.4 pg (ref 26.6–33.0)
MCHC: 32.7 g/dL (ref 31.5–35.7)
MCV: 93 fL (ref 79–97)
Platelets: 278 10*3/uL (ref 150–450)
RBC: 4.08 x10E6/uL — ABNORMAL LOW (ref 4.14–5.80)
RDW: 11.1 % — ABNORMAL LOW (ref 11.6–15.4)
WBC: 9.4 10*3/uL (ref 3.4–10.8)

## 2021-03-17 LAB — TSH: TSH: 1.26 u[IU]/mL (ref 0.450–4.500)

## 2021-03-17 MED ORDER — ROSUVASTATIN CALCIUM 5 MG PO TABS: 5.0000 mg | ORAL_TABLET | Freq: Every day | ORAL | 0 refills | Status: DC

## 2021-03-17 MED ORDER — TRULICITY 1.5 MG/0.5ML ~~LOC~~ SOAJ: 1.5000 mg | SUBCUTANEOUS | 0 refills | Status: AC

## 2021-03-17 MED ORDER — PEN NEEDLES 31G X 8 MM MISC: 6 refills | Status: DC

## 2021-03-17 NOTE — Progress Notes (Signed)
Kidney function normal.   Liver function normal.   Thyroid function normal.   Vitamin D normal.   CBC slightly below normal but about the same as 1 year ago. Encouraged to recheck in 4 weeks.   Cholesterol higher than expected. High cholesterol may increase risk of heart attack and/or stroke. Consider eating more fruits, vegetables, and lean baked meats such as chicken or fish. Moderate intensity exercise at least 150 minutes as tolerated per week may help as well. Continue Rosuvastatin for high cholesterol.  Hemoglobin A1c not at goal at 8.7%. This is increased from previous hemoglobin A1c of 8.5% in January 2022.   Continue Metformin and Glimepiride. Will increase Trulicity to 1.5 mg weekly. Please call our office to schedule a diabetes checkup in 4 weeks.

## 2021-04-11 ENCOUNTER — Other Ambulatory Visit: Payer: Self-pay | Admitting: Registered Nurse

## 2021-04-11 DIAGNOSIS — I10 Essential (primary) hypertension: Secondary | ICD-10-CM

## 2021-04-12 NOTE — Telephone Encounter (Signed)
Per patient request Hydrochlorothiazide (Hydrodiuril) refilled. Please schedule appointment for additional refills.

## 2021-04-20 ENCOUNTER — Other Ambulatory Visit: Payer: Self-pay | Admitting: Registered Nurse

## 2021-04-20 DIAGNOSIS — E113299 Type 2 diabetes mellitus with mild nonproliferative diabetic retinopathy without macular edema, unspecified eye: Secondary | ICD-10-CM

## 2021-04-20 DIAGNOSIS — I10 Essential (primary) hypertension: Secondary | ICD-10-CM

## 2021-04-20 NOTE — Telephone Encounter (Signed)
Per patient request Lisinopril and Metformin refilled. Please schedule appointment for additional refills.

## 2021-06-10 ENCOUNTER — Other Ambulatory Visit: Payer: Self-pay | Admitting: Family

## 2021-06-10 DIAGNOSIS — E785 Hyperlipidemia, unspecified: Secondary | ICD-10-CM

## 2021-06-10 DIAGNOSIS — I10 Essential (primary) hypertension: Secondary | ICD-10-CM

## 2021-06-10 NOTE — Telephone Encounter (Signed)
Per patient request Metoprolol Succinate (Toprol-XL) and Rosuvastatin refilled. Please schedule office visit for additional refills.

## 2021-06-12 NOTE — Progress Notes (Signed)
Virtual Visit via Telephone Note  I connected with Randy Ballard, on 06/15/2021 at 4:09 PM by telephone due to the COVID-19 pandemic and verified that I am speaking with the correct person using two identifiers.  Due to current restrictions/limitations of in-office visits due to the COVID-19 pandemic, this scheduled clinical appointment was converted to a telehealth visit.   Consent: I discussed the limitations, risks, security and privacy concerns of performing an evaluation and management service by telephone and the availability of in person appointments. I also discussed with the patient that there may be a patient responsible charge related to this service. The patient expressed understanding and agreed to proceed.   Location of Patient: Home  Location of Provider: [facility] [place] participating in Telemedicine visit: [address] Durene Fruits, NP [place] Else, [place]   History of Present Illness: Randy Ballard is a 60 year-old male who presents for hypertension and diabetes follow-up.   HYPERTENSION FOLLOW-UP: 03/16/2021: - Continue Metoprolol Succinate as prescribed.   06/15/2021: Doing well on current regimen. Today's blood pressure is 127/74.  2. DIABETES TYPE 2 FOLLOW-UP: 03/16/2021: Continue Metformin and Glimepiride. Will increase Trulicity to 1.5 mg weekly. Please call our office to schedule a diabetes checkup in 4 weeks.  9/04/15/2021: Stopped taking Trulicity 1 month ago because caused upset stomach. Still taking Metformin and Glimepiride.   3. HYPOTHYROID: Requesting refills on Levothyroxine.  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     Dulaglutide (TRULICITY) 1.5 WC/3.7SE SOPN Inject 1.5 mg into the skin once a week. 6 mL 0   glimepiride (AMARYL) 4 MG tablet TAKE 2 TABLETS(8 MG) BY MOUTH DAILY BEFORE BREAKFAST 180 tablet 0   glucose blood test strip Test blood sugar daily. 100 each 3   hydrochlorothiazide (HYDRODIURIL) 25 MG tablet Take 1 tablet (25 mg total) by mouth daily. 90 tablet 0   Insulin Pen Needle (PEN NEEDLES) 31G X 8 MM MISC UAD 100 each 6   Lancet Devices (LANCING DEVICE) MISC Test blood sugar daily. 1 each 0   levothyroxine (SYNTHROID) 150 MCG tablet TAKE 1 TABLET(150 MCG) BY MOUTH DAILY BEFORE BREAKFAST 90 tablet 3   lisinopril (ZESTRIL) 40 MG tablet Take 1 tablet (40 mg total) by mouth daily. 90 tablet 0   metFORMIN (GLUCOPHAGE) 500 MG tablet TAKE 2 TABLETS(1000 MG) BY MOUTH TWICE DAILY WITH A MEAL 120 tablet 2   metoprolol succinate (TOPROL-XL) 100 MG 24 hr tablet TAKE 1 TABLET BY MOUTH DAILY IMMEDIATELY FOLLOWING MEAL 60 tablet 0   rosuvastatin (CRESTOR) 5 MG tablet TAKE 1 TABLET(5 MG) BY MOUTH AT BEDTIME 90 tablet 0   No current facility-administered medications on file prior to visit.    Observations/Objective: Alert and oriented x 3. Not in  acute distress. Physical examination not completed as this is a telemedicine visit.  Assessment and Plan: 1. Essential hypertension: - Continue Metoprolol as prescribed.  - Counseled on blood pressure goal of less than 130/80, low-sodium, DASH diet, medication compliance, 150 minutes of moderate intensity exercise per week as tolerated. Discussed medication compliance, adverse effects. -  Follow-up with primary provider in 3 months or sooner if needed.  - metoprolol succinate (TOPROL-XL) 100 MG 24 hr tablet; TAKE 1 TABLET BY MOUTH DAILY IMMEDIATELY FOLLOWING MEAL  Dispense: 90 tablet; Refill: 0  2. 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  - Patient self-discontinued Dulaglutide related to side effect of upset stomach. - Discussed the importance of healthy eating habits, low-carbohydrate diet, low-sugar diet, regular aerobic exercise (at least 150 minutes a week as tolerated) and medication compliance to achieve or maintain control of diabetes. - Patient plans to come in to office for lab only visit within the next 2 weeks for updated hemoglobin A1c.  - Follow-up with primary provider as scheduled. - POCT glycosylated hemoglobin (Hb A1C); Future - glimepiride (AMARYL) 4 MG tablet; TAKE 2 TABLETS(8 MG) BY MOUTH DAILY BEFORE BREAKFAST  Dispense: 180 tablet; Refill: 0 - metFORMIN (GLUCOPHAGE) 500 MG tablet; TAKE 2 TABLETS(1000 MG) BY MOUTH TWICE DAILY WITH A MEAL  Dispense: 360 tablet; Refill: 0  3. Hypothyroidism, unspecified type: - Continue Levothyroxine as prescribed.  - TSH last obtained 03/16/2021 and normal at that time.  - Follow-up with primary provider as scheduled. - levothyroxine (SYNTHROID) 150 MCG tablet; TAKE 1 TABLET(150 MCG) BY MOUTH DAILY BEFORE BREAKFAST  Dispense: 90 tablet; Refill: 3   Follow Up Instructions: Follow-up with primary provider in 3 months or sooner if needed.    Patient was given clear instructions to go to Emergency Department or return to medical center if symptoms don't improve, worsen, or new problems develop.The patient verbalized understanding.  I discussed the assessment and treatment plan with the patient. The patient was provided an opportunity to ask  questions and all were answered. The patient agreed with the plan and demonstrated an understanding of the instructions.   The patient was advised to call back or seek an in-person evaluation if the symptoms worsen or if the condition fails to improve as anticipated.    I provided 15 minutes total of non-face-to-face time during this encounter.   Camillia Herter, NP  [facility] [place], [place] 06/15/2021, 4:09 PM

## 2021-06-14 ENCOUNTER — Telehealth: Payer: Self-pay | Admitting: Family

## 2021-06-14 NOTE — Telephone Encounter (Signed)
.  Mr. Randy, Ballard are scheduled for a virtual visit with your provider today.    Just as we do with appointments in the office, we must obtain your consent to participate.  Your consent will be active for this visit and any virtual visit you may have with one of our providers in the next 365 days.    If you have a MyChart account, I can also send a copy of this consent to you electronically.  All virtual visits are billed to your insurance company just like a traditional visit in the office.  As this is a virtual visit, video technology does not allow for your provider to perform a traditional examination.  This may limit your provider's ability to fully assess your condition.  If your provider identifies any concerns that need to be evaluated in person or the need to arrange testing such as labs, EKG, etc, we will make arrangements to do so.    Although advances in technology are sophisticated, we cannot ensure that it will always work on either your end or our end.  If the connection with a video visit is poor, we may have to switch to a telephone visit.  With either a video or telephone visit, we are not always able to ensure that we have a secure connection.   I need to obtain your verbal consent now.   Are you willing to proceed with your visit today?   Randy Ballard has provided verbal consent on 06/14/2021 for a virtual visit (video or telephone).   Katharine Look 06/14/2021  9:37 AM

## 2021-06-15 ENCOUNTER — Telehealth (INDEPENDENT_AMBULATORY_CARE_PROVIDER_SITE_OTHER): Payer: Federal, State, Local not specified - PPO | Admitting: Family

## 2021-06-15 ENCOUNTER — Other Ambulatory Visit: Payer: Self-pay

## 2021-06-15 DIAGNOSIS — I10 Essential (primary) hypertension: Secondary | ICD-10-CM

## 2021-06-15 DIAGNOSIS — E039 Hypothyroidism, unspecified: Secondary | ICD-10-CM

## 2021-06-15 DIAGNOSIS — E113299 Type 2 diabetes mellitus with mild nonproliferative diabetic retinopathy without macular edema, unspecified eye: Secondary | ICD-10-CM | POA: Diagnosis not present

## 2021-06-15 MED ORDER — METOPROLOL SUCCINATE ER 100 MG PO TB24: ORAL_TABLET | ORAL | 0 refills | Status: DC

## 2021-06-15 MED ORDER — GLIMEPIRIDE 4 MG PO TABS: ORAL_TABLET | ORAL | 0 refills | Status: DC

## 2021-06-15 MED ORDER — METFORMIN HCL 500 MG PO TABS: ORAL_TABLET | ORAL | 0 refills | Status: DC

## 2021-06-15 MED ORDER — LEVOTHYROXINE SODIUM 150 MCG PO TABS: ORAL_TABLET | ORAL | 3 refills | Status: DC

## 2021-06-15 NOTE — Progress Notes (Signed)
Pt presents for telemedicine visit for medication refills needs levothyroxine, glimepiride

## 2021-06-17 ENCOUNTER — Telehealth: Payer: Federal, State, Local not specified - PPO | Admitting: Family

## 2021-07-03 ENCOUNTER — Other Ambulatory Visit: Payer: Self-pay | Admitting: Registered Nurse

## 2021-07-03 DIAGNOSIS — I10 Essential (primary) hypertension: Secondary | ICD-10-CM

## 2021-07-08 ENCOUNTER — Other Ambulatory Visit: Payer: Self-pay | Admitting: Family

## 2021-07-08 DIAGNOSIS — I10 Essential (primary) hypertension: Secondary | ICD-10-CM

## 2021-07-11 MED ORDER — LISINOPRIL 40 MG PO TABS: 40.0000 mg | ORAL_TABLET | Freq: Every day | ORAL | 0 refills | Status: DC

## 2021-07-11 MED ORDER — HYDROCHLOROTHIAZIDE 25 MG PO TABS: 25.0000 mg | ORAL_TABLET | Freq: Every day | ORAL | 0 refills | Status: DC

## 2021-07-11 NOTE — Telephone Encounter (Signed)
Hydrochlorothiazide (Hydrodiuril) and Lisinopril (Zestril) refilled per patient request.

## 2021-08-02 ENCOUNTER — Telehealth (INDEPENDENT_AMBULATORY_CARE_PROVIDER_SITE_OTHER): Payer: Federal, State, Local not specified - PPO | Admitting: Nurse Practitioner

## 2021-08-02 ENCOUNTER — Other Ambulatory Visit: Payer: Self-pay

## 2021-08-02 DIAGNOSIS — G118 Other hereditary ataxias: Secondary | ICD-10-CM | POA: Diagnosis not present

## 2021-08-02 NOTE — Progress Notes (Signed)
Virtual Visit via Telephone Note  I connected with Randy Ballard on 08/05/21 at  2:20 PM EDT by telephone and verified that I am speaking with the correct person using two identifiers.  Location: Patient: home Provider: office   I discussed the limitations, risks, security and privacy concerns of performing an evaluation and management service by telephone and the availability of in person appointments. I also discussed with the patient that there may be a patient responsible charge related to this service. The patient expressed understanding and agreed to proceed.   History of Present Illness:  Patient presents today for telephone visit.  He states that he is on 3 times in the past month.  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  He is feels that his condition is worsening.  He is requesting home health OT.  He states that the times that he has fallen he has had to call EMS to come help him get up because his wife is elderly and frail and cannot help him.  We discussed that we will talk to the caseworker about this and hopefully get him some home health set up to come out to the house. Denies f/c/s, n/v/d, hemoptysis, PND, chest pain or edema.    Observations/Objective:  Vitals with BMI 03/16/2021 12/10/2020 10/18/2020  Height (No Data) (No Data) -  Weight (No Data) (No Data) -  BMI - - -  Systolic 126 - 152  Diastolic 79 - 86  Pulse 74 - -      Assessment and Plan:  Patient Instructions  SCA-3:  Slowly worsening strength and coordination  Will order home health OT  Follow up with PCP in 1 month  It is important to avoid accidents which may result in broken bones.  Here are a few ideas on how to make your home safer so you will be less likely to trip or fall.  Use nonskid mats or non slip strips in your shower or tub, on your bathroom floor and around sinks.  If you know that you have spilled water, wipe it up! In the bathroom, it is important to have properly installed  grab bars on the walls or on the edge of the tub.  Towel racks are NOT strong enough for you to hold onto or to pull on for support. Stairs and hallways should have enough light.  Add lamps or night lights if you need ore light. It is good to have handrails on both sides of the stairs if possible.  Always fix broken handrails right away. It is important to see the edges of steps.  Paint the edges of outdoor steps white so you can see them better.  Put colored tape on the edge of inside steps. Throw-rugs are dangerous because they can slide.  Removing the rugs is the best idea, but if they must stay, add adhesive carpet tape to prevent slipping. Do not keep things on stairs or in the halls.  Remove small furniture that blocks the halls as it may cause you to trip.  Keep telephone and electrical cords out of the way where you walk. Always were sturdy, rubber-soled shoes for good support.  Never wear just socks, especially on the stairs.  Socks may cause you to slip or fall.  Do not wear full-length housecoats as you can easily trip on the bottom.  Place the things you use the most on the shelves that are the easiest to reach.  If you use a stepstool, make sure it  is in good condition.  If you feel unsteady, DO NOT climb, ask for help. If a health professional advises you to use a cane or walker, do not be ashamed.  These items can keep you from falling and breaking your bones.      I discussed the assessment and treatment plan with the patient. The patient was provided an opportunity to ask questions and all were answered. The patient agreed with the plan and demonstrated an understanding of the instructions.   The patient was advised to call back or seek an in-person evaluation if the symptoms worsen or if the condition fails to improve as anticipated.  I provided 23 minutes of non-face-to-face time during this encounter.   Ivonne Joaquin, NP

## 2021-08-05 ENCOUNTER — Encounter: Payer: Self-pay | Admitting: Nurse Practitioner

## 2021-08-05 NOTE — Patient Instructions (Signed)
SCA-3:  Slowly worsening strength and coordination  Will order home health OT  Follow up with PCP in 1 month  It is important to avoid accidents which may result in broken bones.  Here are a few ideas on how to make your home safer so you will be less likely to trip or fall.  Use nonskid mats or non slip strips in your shower or tub, on your bathroom floor and around sinks.  If you know that you have spilled water, wipe it up! In the bathroom, it is important to have properly installed grab bars on the walls or on the edge of the tub.  Towel racks are NOT strong enough for you to hold onto or to pull on for support. Stairs and hallways should have enough light.  Add lamps or night lights if you need ore light. It is good to have handrails on both sides of the stairs if possible.  Always fix broken handrails right away. It is important to see the edges of steps.  Paint the edges of outdoor steps white so you can see them better.  Put colored tape on the edge of inside steps. Throw-rugs are dangerous because they can slide.  Removing the rugs is the best idea, but if they must stay, add adhesive carpet tape to prevent slipping. Do not keep things on stairs or in the halls.  Remove small furniture that blocks the halls as it may cause you to trip.  Keep telephone and electrical cords out of the way where you walk. Always were sturdy, rubber-soled shoes for good support.  Never wear just socks, especially on the stairs.  Socks may cause you to slip or fall.  Do not wear full-length housecoats as you can easily trip on the bottom.  Place the things you use the most on the shelves that are the easiest to reach.  If you use a stepstool, make sure it is in good condition.  If you feel unsteady, DO NOT climb, ask for help. If a health professional advises you to use a cane or walker, do not be ashamed.  These items can keep you from falling and breaking your bones.

## 2021-08-10 ENCOUNTER — Other Ambulatory Visit: Payer: Self-pay | Admitting: Family

## 2021-08-10 DIAGNOSIS — I10 Essential (primary) hypertension: Secondary | ICD-10-CM

## 2021-08-17 ENCOUNTER — Other Ambulatory Visit: Payer: Self-pay | Admitting: Nurse Practitioner

## 2021-08-17 DIAGNOSIS — G118 Other hereditary ataxias: Secondary | ICD-10-CM

## 2021-09-07 ENCOUNTER — Other Ambulatory Visit: Payer: Self-pay

## 2021-09-07 ENCOUNTER — Telehealth: Payer: Self-pay | Admitting: Family

## 2021-09-07 DIAGNOSIS — E039 Hypothyroidism, unspecified: Secondary | ICD-10-CM

## 2021-09-07 MED ORDER — LEVOTHYROXINE SODIUM 150 MCG PO TABS: ORAL_TABLET | ORAL | 0 refills | Status: DC

## 2021-09-07 NOTE — Telephone Encounter (Signed)
Has 2 pills left  levothyroxine (SYNTHROID) 150 MCG tablet [[id number]]    Pharmacy  [id number] [place], [place] - [address] AT [facility]  [address] [place] [address]  Phone:  [phone]  Fax:  [phone]

## 2021-09-08 NOTE — Progress Notes (Signed)
Virtual Visit via Telephone Note  I connected with Alvira Monday, on 09/12/2021 at 10:45 AM by telephone due to the COVID-19 pandemic and verified that I am speaking with the correct person using two identifiers.  Due to current restrictions/limitations of in-office visits due to the COVID-19 pandemic, this scheduled clinical appointment was converted to a telehealth visit.   Consent: I discussed the limitations, risks, security and privacy concerns of performing an evaluation and management service by telephone and the availability of in person appointments. I also discussed with the patient that there may be a patient responsible charge related to this service. The patient expressed understanding and agreed to proceed.   Location of Patient: Home  Location of Provider: [facility] participating in Telemedicine visit: OLUWATOBI VISSER Durene Fruits, NP Elmon Else, [place]   History of Present Illness: Zymire Turnbo is a 60 year-old male who presents for fall follow-up.   Reports initial fall was 2 months ago. At that time fell out of the chair in the dining room while sleeping. Reports the chair did not have any arms. He stayed on the floor for 6 hours waiting for his wife to return home from work. About 1 week later had second fall while brushing teeth. Reports he has to turn his rolator sideways to get into the bathroom. While holding onto the sink thinks wheels got hung on his feet causing fall. A few days later fell when trying to get in the bed. Reports at least twice emergency services was called to the home to help him off the floor. Reports he did not seek medical evaluation at the hospital stating once he gets off the floor he is fine. Denies hitting his head or losing consciousness during/after any of the falls. Since then remaining in his wheelchair instead of using rolator. Thinks nervousness combined with ataxia may be the cause  of falls. He has been doing independent research on YouTube. He is ready for referral back to Neurology. Requesting occupational therapy in the home. Plans to discuss with wife if he would like a home health aide to come out as well. Requesting transportation assistance to medical appointments.    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     glimepiride (AMARYL) 4 MG tablet TAKE 2 TABLETS(8 MG) BY MOUTH DAILY BEFORE BREAKFAST 180 tablet 0   glucose blood test strip Test blood sugar daily. 100 each 3   hydrochlorothiazide (HYDRODIURIL) 25 MG tablet Take 1 tablet (25 mg total) by mouth daily. 90 tablet 0   Insulin Pen Needle (PEN NEEDLES) 31G X 8 MM MISC UAD 100 each 6   Lancet Devices (LANCING DEVICE) MISC Test blood sugar daily. 1 each 0   levothyroxine (SYNTHROID) 150 MCG  tablet TAKE 1 TABLET(150 MCG) BY MOUTH DAILY BEFORE BREAKFAST 90 tablet 0   lisinopril (ZESTRIL) 40 MG tablet Take 1 tablet (40 mg total) by mouth daily. 90 tablet 0   metFORMIN (GLUCOPHAGE) 500 MG tablet TAKE 2 TABLETS(1000 MG) BY MOUTH TWICE DAILY WITH A MEAL 360 tablet 0   metoprolol succinate (TOPROL-XL) 100 MG 24 hr tablet TAKE 1 TABLET BY MOUTH EVERY DAY IMMEDIATELY FOLLOWING A MEAL 90 tablet 0   rosuvastatin (CRESTOR) 5 MG tablet TAKE 1 TABLET(5 MG) BY MOUTH AT BEDTIME 90  tablet 0   No current facility-administered medications on file prior to visit.    Observations/Objective: Alert and oriented x 3. Not in acute distress. Physical examination not completed as this is a telemedicine visit.  Assessment and Plan: 1. 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 Fall, initial encounter: - Frequent falls likely secondary to spinocerebellar ataxia. - Counseled to seek immediate medical evaluation for any future falls. Patient verbalized understanding.  - Referral to Occupational Therapy for further evaluation and management. Patient requesting occupational therapy in home setting only.  - Referral to Neurology for further evaluation and management.  Eden Lathe, RN case manager to assist patient with transportation resources to medical appointments.  - Follow-up with primary provider as scheduled. - Ambulatory referral to Neurology - OT eval and treat; Future  Follow Up Instructions: Follow-up with primary provider as scheduled.   Patient was given clear instructions to go to Emergency Department or return to medical center if symptoms don't improve, worsen, or new problems develop.The patient verbalized understanding.  I discussed the assessment and treatment plan with the patient. The patient was provided an opportunity to ask questions and all were answered. The patient agreed with the plan and demonstrated an understanding of the instructions.   The patient was advised to call back or seek an in-person evaluation if the symptoms worsen or if the condition fails to improve as anticipated.     I provided 12 minutes total of non-face-to-face time during this encounter.   Camillia Herter, NP  [facility] [place], [place] 09/12/2021, 10:45 AM

## 2021-09-12 ENCOUNTER — Telehealth: Payer: Self-pay | Admitting: Family

## 2021-09-12 ENCOUNTER — Other Ambulatory Visit: Payer: Self-pay

## 2021-09-12 ENCOUNTER — Telehealth (INDEPENDENT_AMBULATORY_CARE_PROVIDER_SITE_OTHER): Payer: Federal, State, Local not specified - PPO | Admitting: Family

## 2021-09-12 DIAGNOSIS — W19XXXA Unspecified fall, initial encounter: Secondary | ICD-10-CM | POA: Diagnosis not present

## 2021-09-12 DIAGNOSIS — G118 Other hereditary ataxias: Secondary | ICD-10-CM | POA: Diagnosis not present

## 2021-09-12 NOTE — Telephone Encounter (Signed)
Attempted to contact patient to discuss transportation needs. Message left on # [phone]. Call back requested to this CM. Call also placed to # [phone], voicemail could not accept any more messages..  Need to inquire if patient has checked with his insurance company if they offer transportation benefit.  Also, has he applied for [employer] [place]? [employer]?

## 2021-09-12 NOTE — Progress Notes (Signed)
Pt presents for telemedicine visit for fall follow-up, pt states he has not fallen anymore since but has been wheelchair bound for about 2 months

## 2021-09-15 NOTE — Telephone Encounter (Signed)
Attempted again to contact patient to discuss transportation needs. Call placed to  # [phone] twice today and the phone rings busy.  Call also placed to # [phone], message left with call back requested to this CM.Marland Kitchen   Need to inquire if patient has checked with his insurance company if they offer transportation benefit.  Also, has he applied for [employer] [place]? [employer] [employer]?

## 2021-09-20 ENCOUNTER — Other Ambulatory Visit: Payer: Self-pay | Admitting: Family

## 2021-09-20 DIAGNOSIS — E113299 Type 2 diabetes mellitus with mild nonproliferative diabetic retinopathy without macular edema, unspecified eye: Secondary | ICD-10-CM

## 2021-09-20 DIAGNOSIS — E785 Hyperlipidemia, unspecified: Secondary | ICD-10-CM

## 2021-09-21 NOTE — Telephone Encounter (Signed)
Rosuvastatin (Crestor) and Glimepiride (Amaryl) refilled per patient request for 30 day courtesy. Hemoglobin A1c overdue. During appointment 06/15/2021 patient reported he would come in for updated hemoglobin A1c. Please schedule appointment for additional refills.

## 2021-09-27 NOTE — Telephone Encounter (Signed)
Attempted again to contact patient to discuss transportation needs. Call placed to  # [phone] and the phone just rings busy.  Call also placed to # [phone], message left with call back requested to this CM.Marland Kitchen   Need to inquire if patient has checked with his insurance company if they offer transportation benefit.  Also, has he applied for [employer] [place]? [employer]?   Amy, as an FYI - I have tried to reach him multiple times with no success.

## 2021-10-04 ENCOUNTER — Other Ambulatory Visit: Payer: Self-pay | Admitting: Family

## 2021-10-04 DIAGNOSIS — E113299 Type 2 diabetes mellitus with mild nonproliferative diabetic retinopathy without macular edema, unspecified eye: Secondary | ICD-10-CM

## 2021-10-04 MED ORDER — METFORMIN HCL 500 MG PO TABS: ORAL_TABLET | ORAL | 0 refills | Status: DC

## 2021-10-07 ENCOUNTER — Other Ambulatory Visit: Payer: Self-pay | Admitting: Family

## 2021-10-07 DIAGNOSIS — I10 Essential (primary) hypertension: Secondary | ICD-10-CM

## 2021-11-02 ENCOUNTER — Ambulatory Visit: Payer: Federal, State, Local not specified - PPO | Admitting: Diagnostic Neuroimaging

## 2021-11-03 ENCOUNTER — Other Ambulatory Visit: Payer: Self-pay

## 2021-11-03 DIAGNOSIS — E113299 Type 2 diabetes mellitus with mild nonproliferative diabetic retinopathy without macular edema, unspecified eye: Secondary | ICD-10-CM

## 2021-11-16 ENCOUNTER — Other Ambulatory Visit: Payer: Self-pay | Admitting: Family

## 2021-11-16 DIAGNOSIS — I10 Essential (primary) hypertension: Secondary | ICD-10-CM

## 2021-11-17 MED ORDER — METOPROLOL SUCCINATE ER 100 MG PO TB24: ORAL_TABLET | ORAL | 0 refills | Status: DC

## 2021-11-29 ENCOUNTER — Other Ambulatory Visit: Payer: Self-pay | Admitting: Family

## 2021-11-29 DIAGNOSIS — E113299 Type 2 diabetes mellitus with mild nonproliferative diabetic retinopathy without macular edema, unspecified eye: Secondary | ICD-10-CM

## 2021-11-30 ENCOUNTER — Other Ambulatory Visit: Payer: Self-pay

## 2021-11-30 DIAGNOSIS — E113299 Type 2 diabetes mellitus with mild nonproliferative diabetic retinopathy without macular edema, unspecified eye: Secondary | ICD-10-CM

## 2021-11-30 MED ORDER — METFORMIN HCL 500 MG PO TABS: ORAL_TABLET | ORAL | 0 refills | Status: DC

## 2021-12-02 ENCOUNTER — Other Ambulatory Visit: Payer: Self-pay | Admitting: Family

## 2021-12-02 ENCOUNTER — Other Ambulatory Visit: Payer: Self-pay

## 2021-12-02 DIAGNOSIS — E039 Hypothyroidism, unspecified: Secondary | ICD-10-CM

## 2021-12-02 MED ORDER — LEVOTHYROXINE SODIUM 150 MCG PO TABS: ORAL_TABLET | ORAL | 0 refills | Status: DC

## 2021-12-21 ENCOUNTER — Encounter: Payer: Self-pay | Admitting: Diagnostic Neuroimaging

## 2021-12-21 ENCOUNTER — Ambulatory Visit (INDEPENDENT_AMBULATORY_CARE_PROVIDER_SITE_OTHER): Payer: Federal, State, Local not specified - PPO | Admitting: Diagnostic Neuroimaging

## 2021-12-21 VITALS — BP 184/68 | HR 63

## 2021-12-21 DIAGNOSIS — G118 Other hereditary ataxias: Secondary | ICD-10-CM | POA: Diagnosis not present

## 2021-12-21 DIAGNOSIS — G959 Disease of spinal cord, unspecified: Secondary | ICD-10-CM | POA: Diagnosis not present

## 2021-12-21 DIAGNOSIS — R29898 Other symptoms and signs involving the musculoskeletal system: Secondary | ICD-10-CM | POA: Diagnosis not present

## 2021-12-21 NOTE — Progress Notes (Signed)
? ?[facility] ? ?PATIENT: Randy Ballard ?DOB: Jun 18, 1961 ? ?REFERRING CLINICIAN: Camillia Herter, NP  ?HISTORY FROM: patient  ?REASON FOR VISIT: new consult ? ?HISTORICAL ? ?CHIEF COMPLAINT:  ?Chief Complaint  ?Patient presents with  ? Frequent falls, ataxia  ?  Rm 7, "haven't driven within past year or so; use wheelchair in home to get around"  ? ? ?HISTORY OF PRESENT ILLNESS:  ? ?UPDATE (12/21/21, VRP): Since last visit, progression of symptoms. Has had several falls, and couldn't get up. Needed EMS to help him get up. Living at home with wife. Now essentially wheelchair only.  ? ?UPDATE 10/27/15: Since last visit, stable. Only 1 minor fall in last year. BP better. Trying to stay active. Having some dental work done, which affects food particle size, and then some swallow issues occur.  ? ?UPDATE 10/26/14 (VRP): Since last visit, has gone through PT evaluation with some benefit. Now doing exercises at home. No falls. No major pain or spasms. No slurred speech or trouble swallowing.  ? ?UPDATE 04/20/14: Since last visit, sxs are stable. Now approved for SSI/SSD, due to start in Sept 2015. Not able to afford PT yet, but hopefully in Oct 2015. Int muscle twitching, without pain.  ? 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 2012 patient developed some muscle weakness, difficulty climbing stairs, balance difficulty. By 2014 his balance is significantly affected. He went through physical therapy without relief. On 11/14/2013 patient quit his job because of these balance and walking difficulties. Patient's mother developed balance and walking problems 15 years ago (when she was 62 years old), resulting in multiple falls. Eventually she was transitioned to a nursing home. She was ultimately diagnosed with spinocerebellar ataxia type III. Patient's maternal grandmother and maternal aunt also have similar condition. Patient himself does not have any children. Patient has no  siblings. Patient underwent gene testing which was positive for spinocerebellar ataxia type III. Nowadays patient having some muscle twitching in his quadriceps, shoulders, arms as well as some slurred speech. He's having difficulty with uneven and slip resurfaces. ? ?REVIEW OF SYSTEMS: Full 14 system review of systems performed and notable only for anemia numbness weakness back pain walking diff.  ? ? ?ALLERGIES: ?Allergies  ?Allergen Reactions  ? Januvia [Sitagliptin] Nausea And Vomiting  ?  pancreatitis  ? ? ?HOME MEDICATIONS: ?Outpatient Medications Prior to Visit  ?Medication Sig Dispense Refill  ? Cholecalciferol (VITAMIN D3) 5000 units CAPS Take 1 capsule (5,000 Units total) by mouth daily.    ? glimepiride (AMARYL) 4 MG tablet TAKE 2 TABLETS(8 MG) BY MOUTH DAILY BEFORE BREAKFAST 60 tablet 0  ? hydrochlorothiazide (HYDRODIURIL) 25 MG tablet TAKE 1 TABLET(25 MG) BY MOUTH DAILY 90 tablet 0  ? levothyroxine (SYNTHROID) 150 MCG tablet TAKE 1 TABLET(150 MCG) BY MOUTH DAILY BEFORE BREAKFAST 90 tablet 0  ? lisinopril (ZESTRIL) 40 MG tablet TAKE 1 TABLET(40 MG) BY MOUTH DAILY 90 tablet 0  ? metFORMIN (GLUCOPHAGE) 500 MG tablet TAKE 2 TABLETS(1000 MG) BY MOUTH TWICE DAILY WITH A MEAL 180 tablet 0  ? metoprolol succinate (TOPROL-XL) 100 MG 24 hr tablet TAKE 1 TABLET BY MOUTH EVERY DAY IMMEDIATELY FOLLOWING A MEAL 90 tablet 0  ? rosuvastatin (CRESTOR) 5 MG tablet TAKE 1 TABLET(5 MG) BY MOUTH AT BEDTIME 90 tablet 0  ? Blood Glucose Monitoring Suppl (BLOOD GLUCOSE METER) kit Test blood sugar daily 1 each 0  ? glucose blood test strip Test blood sugar daily. 100 each  3  ? Insulin Pen Needle (PEN NEEDLES) 31G X 8 MM MISC UAD 100 each 6  ? Lancet Devices (LANCING DEVICE) MISC Test blood sugar daily. 1 each 0  ? ?No facility-administered medications prior to visit.  ? ? ?PAST MEDICAL HISTORY: ?Past Medical History:  ?Diagnosis Date  ? Diabetes mellitus without complication ([place])   ? Diverticulitis   ? Hyperlipidemia   ?  Hypertension   ? Hypogonadism male   ? Neuromuscular disorder ([place])   ? SCA-3 (spinocerebellar ataxia type 3) ([place])   ? Thyroid disease 02/06/2009  ? Graves s/p RAIU; cold nodule s/p negative biopsy  ? ? ?PAST SURGICAL HISTORY: ?Past Surgical History:  ?Procedure Laterality Date  ? BIOPSY THYROID  02/06/2009  ? cold thyroid nodule; negative/benign.  ? COLONOSCOPY W/ POLYPECTOMY  Feb 2014  ? 5 polyps  ? TOOTH EXTRACTION  10/2015  ? x 4 teeth  ? ? ?FAMILY HISTORY: ?Family History  ?Adopted: Yes  ?Problem Relation Age of Onset  ? Ataxia Mother 21  ?     SCA3  ? Ataxia Maternal Grandmother   ?     SCA3  ? Ataxia Maternal Aunt   ?     SCA3  ? ? ?SOCIAL HISTORY: ? ?Social History  ? ?Socioeconomic History  ? Marital status: Married  ?  Spouse name: Helene Kelp  ? Number of children: 0  ? Years of education: HS  ? Highest education level: Not on file  ?Occupational History  ? Occupation: service rep  ? Occupation: retired  ?  Employer: [employer]  ?Tobacco Use  ? Smoking status: Some Days  ?  Packs/day: 0.50  ?  Years: 32.00  ?  Pack years: 16.00  ?  Types: Cigarettes  ? Smokeless tobacco: Never  ? Tobacco comments:  ?  10/26/15 trying to quit, 12/21/21 smoke on weekends only  ?Vaping Use  ? Vaping Use: Never used  ?Substance and Sexual Activity  ? Alcohol use: No  ?  Alcohol/week: 0.0 standard drinks  ? Drug use: No  ? Sexual activity: Yes  ?  Comment: 1 partner in last 12 months  ?Other Topics Concern  ? Not on file  ?Social History Narrative  ? Marital status:  Married x 20 years; happily.  ?    Children: none  ?    Lives: with wife.  ?    Employment: delivers oxygen; works for [employer]; drives locally, [address]- [id number]  ?    Tobacco: trying to quit 10/2012.  ?    Alcohol: None  ?    Drugs: none  ?    Exercise: treadmill sporadic.  ? ?Social Determinants of Health  ? ?Financial Resource Strain: Not on file  ?Food Insecurity: Not on file  ?Transportation Needs: Not on file  ?Physical Activity: Not on file  ?Stress: Not on file  ?Social  Connections: Not on file  ?Intimate Partner Violence: Not on file  ? ? ?PHYSICAL EXAM ? ?Vitals:  ? 12/21/21 1022  ?BP: (!) 184/68  ?Pulse: 63  ? ?Not recorded ?  ? ? ?There is no height or weight on file to calculate BMI. ? ?GENERAL EXAM: ?Patient is in no distress; well developed, nourished and groomed; neck is supple ? ?CARDIOVASCULAR: ?Regular rhythm; NO MURMURS ? ?NEUROLOGIC: ?MENTAL STATUS: awake, alert, language fluent, comprehension intact, naming intact, fund of knowledge appropriate ?CRANIAL NERVE: pupils equal and reactive to light, visual fields full to confrontation, extraocular muscles intact, MILD SACCADIC DYSMETRIA, facial sensation and strength  symmetric, hearing intact, palate elevates symmetrically, uvula midline, shoulder shrug symmetric, tongue midline; MILD DYSARTHRIA. ?MOTOR: ATROPHY OF HAND INTRINSIC MUSCLES; WEAKNESS IN BUE (3-4) AND BLE (3 prox, 2 distal); BRADYKINESIA IN BUE AND BLE ?SENSORY: ABSENT VIB AT TOES ?COORDINATION: MILD DYSMETRIA WITH LUE > RUE ?REFLEXES: BUE TRACE, KNEES TRACE, ANKLES 0 ?GAIT/STATION: IN WHEELCHAIR ? ? ? ?DIAGNOSTIC DATA (LABS, IMAGING, TESTING) ?- I reviewed patient records, labs, notes, testing and imaging myself where available. ? ?Lab Results  ?Component Value Date  ? WBC 9.4 03/16/2021  ? HGB 12.4 (L) 03/16/2021  ? HCT 37.9 03/16/2021  ? MCV 93 03/16/2021  ? PLT 278 03/16/2021  ? ?   ?Component Value Date/Time  ? NA 137 03/16/2021 1631  ? K 4.4 03/16/2021 1631  ? CL 96 03/16/2021 1631  ? CO2 25 03/16/2021 1631  ? GLUCOSE 211 (H) 03/16/2021 1631  ? GLUCOSE 161 (H) 05/25/2016 0827  ? BUN 13 03/16/2021 1631  ? CREATININE 0.92 03/16/2021 1631  ? CREATININE 0.99 05/25/2016 0827  ? CALCIUM 9.5 03/16/2021 1631  ? PROT 7.6 03/16/2021 1631  ? ALBUMIN 4.6 03/16/2021 1631  ? AST 27 03/16/2021 1631  ? ALT 33 03/16/2021 1631  ? ALKPHOS 86 03/16/2021 1631  ? BILITOT 0.4 03/16/2021 1631  ? GFRNONAA 95 02/20/2020 1510  ? [facility] >89 10/31/2013 1621  ? GFRAA 110  02/20/2020 1510  ? GFRAA >89 10/31/2013 1621  ? ?Lab Results  ?Component Value Date  ? CHOL 158 03/16/2021  ? HDL 36 (L) 03/16/2021  ? [place] 86 03/16/2021  ? TRIG 216 (H) 03/16/2021  ? CHOLHDL 4.4 03/16/2021  ? ?Lab

## 2021-12-22 ENCOUNTER — Telehealth: Payer: Self-pay | Admitting: Diagnostic Neuroimaging

## 2021-12-22 NOTE — Telephone Encounter (Signed)
[place] with [facility] sent me a message asking: ? ?"Looks like his Medicare will pickup and pay his BCBS copays however we do not have any HHA available right now. Can you seen if MD will add Nursing and remove HHA? If so, we can help him." ? ?

## 2021-12-22 NOTE — Telephone Encounter (Signed)
Called Randy Ballard and informed her Dr Leta Baptist gave verbal okay to change referral to nursing instead of home aid. She asked that I leave her VM on her phone and she will put in order. I advised will do that. She verbalized understanding, appreciation. ?I called back and left detailed VM. ?

## 2021-12-22 NOTE — Telephone Encounter (Signed)
Sent a message to [place] with [facility] to see if she would be able to take her.  ?

## 2021-12-28 ENCOUNTER — Telehealth: Payer: Self-pay | Admitting: Diagnostic Neuroimaging

## 2021-12-28 NOTE — Telephone Encounter (Signed)
Called Eliza and LVM, gave verbal okay for ST as recommended.  Left # for questions. ?

## 2021-12-28 NOTE — Telephone Encounter (Signed)
Randy Ballard from [facility] called needing VO for Speech 1x 9 weeks for Dysphasia ?

## 2022-01-01 ENCOUNTER — Other Ambulatory Visit: Payer: Self-pay | Admitting: Family

## 2022-01-01 DIAGNOSIS — I10 Essential (primary) hypertension: Secondary | ICD-10-CM

## 2022-01-03 NOTE — Telephone Encounter (Signed)
Requested medication (s) are due for refill today: Yes ? ?Requested medication (s) are on the active medication list: Yes ? ?Last refill:   ? ?Future visit scheduled: No ? ?Notes to clinic:  Protocol indicates pt. Needs lab work. ? ? ? ?Requested Prescriptions  ?Pending Prescriptions Disp Refills  ? hydrochlorothiazide (HYDRODIURIL) 25 MG tablet [Pharmacy Med Name: HYDROCHLOROTHIAZIDE 25MG  TABLETS] 90 tablet 0  ?  Sig: TAKE 1 TABLET(25 MG) BY MOUTH DAILY  ?  ? Cardiovascular: Diuretics - Thiazide Failed - 01/01/2022 10:43 PM  ?  ?  Failed - Cr in normal range and within 180 days  ?  Creat  ?Date Value Ref Range Status  ?05/25/2016 0.99 0.70 - 1.33 mg/dL Final  ?  Comment:  ?    ?For patients > or = 62 years of age: The upper reference limit for ?Creatinine is approximately 13% higher for people identified as ?African-American. ?  ?  ? ?Creatinine, Ser  ?Date Value Ref Range Status  ?03/16/2021 0.92 0.76 - 1.27 mg/dL Final  ? ?Creatinine, Urine  ?Date Value Ref Range Status  ?05/25/2016 278 20 - 370 mg/dL Final  ?  ?  ?  ?  Failed - K in normal range and within 180 days  ?  Potassium  ?Date Value Ref Range Status  ?03/16/2021 4.4 3.5 - 5.2 mmol/L Final  ?  ?  ?  ?  Failed - Na in normal range and within 180 days  ?  Sodium  ?Date Value Ref Range Status  ?03/16/2021 137 134 - 144 mmol/L Final  ?  ?  ?  ?  Failed - Last BP in normal range  ?  BP Readings from Last 1 Encounters:  ?12/21/21 (!) 184/68  ?  ?  ?  ?  Passed - Valid encounter within last 6 months  ?  Recent Outpatient Visits   ? ?      ? 3 months ago SCA-3 (spinocerebellar ataxia type 3) (HCC)  ? Primary Care at [facility], Amy J, NP  ? 5 months ago SCA-3 (spinocerebellar ataxia type 3) (HCC)  ? Primary Care at [facility], NP  ? 6 months ago Essential hypertension  ? Primary Care at [facility], NP  ? 9 months ago Annual physical exam  ? Primary Care at [facility], [facility], NP  ? 1 year ago  Encounter to establish care  ? Primary Care at [facility], Amy J, NP  ? ?  ?  ? ?  ?  ?  ? lisinopril (ZESTRIL) 40 MG tablet [Pharmacy Med Name: LISINOPRIL 40MG  TABLETS] 90 tablet 0  ?  Sig: TAKE 1 TABLET(40 MG) BY MOUTH DAILY  ?  ? Cardiovascular:  ACE Inhibitors Failed - 01/01/2022 10:43 PM  ?  ?  Failed - Cr in normal range and within 180 days  ?  Creat  ?Date Value Ref Range Status  ?05/25/2016 0.99 0.70 - 1.33 mg/dL Final  ?  Comment:  ?    ?For patients > or = 62 years of age: The upper reference limit for ?Creatinine is approximately 13% higher for people identified as ?African-American. ?  ?  ? ?Creatinine, Ser  ?Date Value Ref Range Status  ?03/16/2021 0.92 0.76 - 1.27 mg/dL Final  ? ?Creatinine, Urine  ?Date Value Ref Range Status  ?05/25/2016 278 20 - 370 mg/dL Final  ?  ?  ?  ?  Failed -  K in normal range and within 180 days  ?  Potassium  ?Date Value Ref Range Status  ?03/16/2021 4.4 3.5 - 5.2 mmol/L Final  ?  ?  ?  ?  Failed - Last BP in normal range  ?  BP Readings from Last 1 Encounters:  ?12/21/21 (!) 184/68  ?  ?  ?  ?  Passed - Patient is not pregnant  ?  ?  Passed - Valid encounter within last 6 months  ?  Recent Outpatient Visits   ? ?      ? 3 months ago SCA-3 (spinocerebellar ataxia type 3) (HCC)  ? Primary Care at [facility], Amy J, NP  ? 5 months ago SCA-3 (spinocerebellar ataxia type 3) (HCC)  ? Primary Care at [facility], Gildardo Pounds, NP  ? 6 months ago Essential hypertension  ? Primary Care at [facility], [place], NP  ? 9 months ago Annual physical exam  ? Primary Care at [facility], [place], NP  ? 1 year ago Encounter to establish care  ? Primary Care at [facility], [place], NP  ? ?  ?  ? ?  ?  ?  ? ?

## 2022-01-04 ENCOUNTER — Telehealth: Payer: Self-pay | Admitting: Diagnostic Neuroimaging

## 2022-01-04 NOTE — Telephone Encounter (Signed)
self pay order sent to GI, they will reach out to the patient to schedule.  

## 2022-01-06 ENCOUNTER — Other Ambulatory Visit: Payer: Self-pay

## 2022-01-06 DIAGNOSIS — E785 Hyperlipidemia, unspecified: Secondary | ICD-10-CM

## 2022-01-06 MED ORDER — ROSUVASTATIN CALCIUM 5 MG PO TABS: ORAL_TABLET | ORAL | 0 refills | Status: DC

## 2022-01-14 ENCOUNTER — Other Ambulatory Visit: Payer: Self-pay | Admitting: Family

## 2022-01-14 DIAGNOSIS — I10 Essential (primary) hypertension: Secondary | ICD-10-CM

## 2022-01-16 ENCOUNTER — Telehealth: Payer: Self-pay | Admitting: Pharmacist

## 2022-01-16 NOTE — Patient Outreach (Signed)
Patient appearing on report for [employer] [facility] - Hypertension Control report due to last documented ambulatory blood pressure of 184/68 on 03/16/2021. Next appointment with PCP is not scheduled.;  ? ?Outreached patient to discuss hypertension control and medication management. Left voicemail asking patient to return my call at his convenience.  ? ?Catie Eppie Gibson, PharmD, BCACP ?[facility] ?[phone] ? ?

## 2022-01-19 ENCOUNTER — Telehealth: Payer: Self-pay

## 2022-01-19 ENCOUNTER — Other Ambulatory Visit: Payer: Self-pay

## 2022-01-19 DIAGNOSIS — I10 Essential (primary) hypertension: Secondary | ICD-10-CM

## 2022-01-19 MED ORDER — HYDROCHLOROTHIAZIDE 25 MG PO TABS: ORAL_TABLET | ORAL | 0 refills | Status: DC

## 2022-01-19 MED ORDER — LISINOPRIL 40 MG PO TABS: ORAL_TABLET | ORAL | 0 refills | Status: DC

## 2022-01-19 NOTE — Telephone Encounter (Signed)
Requested medication (s) are due for refill today: yes ? ?Requested medication (s) are on the active medication list: yes ? ?Last refill:  10/12/21 #90/0 ? ?Future visit scheduled: yes scheduled today for 02/14/22 first available ? ?Notes to clinic:  Pt is out of these medications and BP is elevated. He has made an appt and is needing this refilled until appt.  ? Unable to refill per protocol due to failed labs, no updated results. ? ?  ?Requested Prescriptions  ?Pending Prescriptions Disp Refills  ? lisinopril (ZESTRIL) 40 MG tablet 90 tablet 0  ?  Sig: TAKE 1 TABLET(40 MG) BY MOUTH DAILY  ?  ? Cardiovascular:  ACE Inhibitors Failed - 01/19/2022  3:25 PM  ?  ?  Failed - Cr in normal range and within 180 days  ?  Creat  ?Date Value Ref Range Status  ?05/25/2016 0.99 0.70 - 1.33 mg/dL Final  ?  Comment:  ?    ?For patients > or = 62 years of age: The upper reference limit for ?Creatinine is approximately 13% higher for people identified as ?African-American. ?  ?  ? ?Creatinine, Ser  ?Date Value Ref Range Status  ?03/16/2021 0.92 0.76 - 1.27 mg/dL Final  ? ?Creatinine, Urine  ?Date Value Ref Range Status  ?05/25/2016 278 20 - 370 mg/dL Final  ?  ?  ?  ?  Failed - K in normal range and within 180 days  ?  Potassium  ?Date Value Ref Range Status  ?03/16/2021 4.4 3.5 - 5.2 mmol/L Final  ?  ?  ?  ?  Failed - Last BP in normal range  ?  BP Readings from Last 1 Encounters:  ?12/21/21 (!) 184/68  ?  ?  ?  ?  Passed - Patient is not pregnant  ?  ?  Passed - Valid encounter within last 6 months  ?  Recent Outpatient Visits   ? ?      ? 4 months ago SCA-3 (spinocerebellar ataxia type 3) (HCC)  ? Primary Care at [facility], Amy J, NP  ? 5 months ago SCA-3 (spinocerebellar ataxia type 3) (HCC)  ? Primary Care at [facility], Gildardo Pounds, NP  ? 7 months ago Essential hypertension  ? Primary Care at [facility], [place], [place]  ? 10 months ago Annual physical exam  ? Primary Care at [facility],  [place], [place]  ? 1 year ago Encounter to establish care  ? Primary Care at [facility], [place], [place]  ? ?  ?  ?Future Appointments   ? ?        ? In 3 weeks [name], NP Primary Care at [facility]  ? ?  ? ?  ?  ?  ? hydrochlorothiazide (HYDRODIURIL) 25 MG tablet 90 tablet 0  ?  Sig: TAKE 1 TABLET(25 MG) BY MOUTH DAILY  ?  ? Cardiovascular: Diuretics - Thiazide Failed - 01/19/2022  3:25 PM  ?  ?  Failed - Cr in normal range and within 180 days  ?  Creat  ?Date Value Ref Range Status  ?05/25/2016 0.99 0.70 - 1.33 mg/dL Final  ?  Comment:  ?    ?For patients > or = 62 years of age: The upper reference limit for ?Creatinine is approximately 13% higher for people identified as ?African-American. ?  ?  ? ?Creatinine, Ser  ?Date Value Ref Range Status  ?03/16/2021 0.92 0.76 - 1.27  mg/dL Final  ? ?Creatinine, Urine  ?Date Value Ref Range Status  ?05/25/2016 278 20 - 370 mg/dL Final  ?  ?  ?  ?  Failed - K in normal range and within 180 days  ?  Potassium  ?Date Value Ref Range Status  ?03/16/2021 4.4 3.5 - 5.2 mmol/L Final  ?  ?  ?  ?  Failed - Na in normal range and within 180 days  ?  Sodium  ?Date Value Ref Range Status  ?03/16/2021 137 134 - 144 mmol/L Final  ?  ?  ?  ?  Failed - Last BP in normal range  ?  BP Readings from Last 1 Encounters:  ?12/21/21 (!) 184/68  ?  ?  ?  ?  Passed - Valid encounter within last 6 months  ?  Recent Outpatient Visits   ? ?      ? 4 months ago SCA-3 (spinocerebellar ataxia type 3) (HCC)  ? Primary Care at [facility], Amy J, NP  ? 5 months ago SCA-3 (spinocerebellar ataxia type 3) (HCC)  ? Primary Care at [facility], Gildardo Pounds, NP  ? 7 months ago Essential hypertension  ? Primary Care at [facility], [place], NP  ? 10 months ago Annual physical exam  ? Primary Care at [facility], [place], NP  ? 1 year ago Encounter to establish care  ? Primary Care at [facility], [place], NP  ? ?  ?  ?Future Appointments   ? ?        ?  In 3 weeks [name], NP Primary Care at [facility]  ? ?  ? ?  ?  ?  ? ?

## 2022-01-19 NOTE — Telephone Encounter (Signed)
Mary, RN from [place] was there with pt doing home visit. She was calling to report pt's BP has be elevated and he had a HR of 49 but was asymptomatic. Pt has been out of hctz and is low on lisinopril. I advised her I will try to refill those meds for him. Scheduled OV for first available on 02/14/22 and added pt to wait list. She rechecked pt's pulse before ending call and it was back up to normal of 75. I advised her to let pt know if he becomes symptomatic for BP or HR to call office and let us know so we can try to work him in or if it was bad enough go to UC. She reported that to pt. No further assistance needed.  ?

## 2022-01-26 ENCOUNTER — Other Ambulatory Visit: Payer: Self-pay

## 2022-01-26 ENCOUNTER — Other Ambulatory Visit: Payer: Self-pay | Admitting: Family

## 2022-01-26 DIAGNOSIS — E113299 Type 2 diabetes mellitus with mild nonproliferative diabetic retinopathy without macular edema, unspecified eye: Secondary | ICD-10-CM

## 2022-01-26 MED ORDER — GLIMEPIRIDE 4 MG PO TABS: ORAL_TABLET | ORAL | 0 refills | Status: DC

## 2022-01-26 NOTE — Telephone Encounter (Signed)
Attempted outreach again. Left voicemail for patient to return my call at his convenience.  ? ?Catie Eppie Gibson, PharmD, BCACP ?[facility] ?[phone] ? ?

## 2022-01-26 NOTE — Telephone Encounter (Signed)
Medication Refill - Medication: glimepiride (AMARYL) 4 MG tablet  ? ?Has the patient contacted their pharmacy? Yes.   ? ?(Agent: If yes, when and what did the pharmacy advise?) Nurse called stated patient called pharmacy  ? ?Preferred Pharmacy (with phone number or street name):  ?[facility] [id number], [place] Phone:  [phone]  ?Fax:  [phone]  ?  ? ? ?Has the patient been seen for an appointment in the last year OR does the patient have an upcoming appointment? Yes.   ? ?Agent: Please be advised that RX refills may take up to 3 business days. We ask that you follow-up with your pharmacy. ? ?

## 2022-01-27 NOTE — Telephone Encounter (Signed)
Duplicate request. ?Requested Prescriptions  ?Pending Prescriptions Disp Refills  ?? glimepiride (AMARYL) 4 MG tablet 60 tablet 0  ?  Sig: TAKE 2 TABLETS(8 MG) BY MOUTH DAILY BEFORE BREAKFAST  ?  ? Endocrinology:  Diabetes - Sulfonylureas Failed - 01/26/2022  4:12 PM  ?  ?  Failed - HBA1C is between 0 and 7.9 and within 180 days  ?  Hgb A1c MFr Bld  ?Date Value Ref Range Status  ?03/16/2021 8.7 (H) 4.8 - 5.6 % Final  ?  Comment:  ?           Prediabetes: 5.7 - 6.4 ?         Diabetes: >6.4 ?         Glycemic control for adults with diabetes: <7.0 ?  ?   ?  ?  Passed - Cr in normal range and within 360 days  ?  Creat  ?Date Value Ref Range Status  ?05/25/2016 0.99 0.70 - 1.33 mg/dL Final  ?  Comment:  ?    ?For patients > or = 62 years of age: The upper reference limit for ?Creatinine is approximately 13% higher for people identified as ?African-American. ?  ?  ? ?Creatinine, Ser  ?Date Value Ref Range Status  ?03/16/2021 0.92 0.76 - 1.27 mg/dL Final  ? ?Creatinine, Urine  ?Date Value Ref Range Status  ?05/25/2016 278 20 - 370 mg/dL Final  ?   ?  ?  Passed - Valid encounter within last 6 months  ?  Recent Outpatient Visits   ?      ? 4 months ago SCA-3 (spinocerebellar ataxia type 3) ([place])  ? Primary Care at [facility], Amy J, NP  ? 5 months ago SCA-3 (spinocerebellar ataxia type 3) ([place])  ? Primary Care at [facility], Kriste Basque, NP  ? 7 months ago Essential hypertension  ? Primary Care at [facility], [place], NP  ? 10 months ago Annual physical exam  ? Primary Care at [facility], [place], NP  ? 1 year ago Encounter to establish care  ? Primary Care at [facility], [place], NP  ?  ?  ?Future Appointments   ?        ? In 2 weeks Camillia Herter, NP Primary Care at [facility]  ?  ? ?  ?  ?  ? ? ?

## 2022-02-11 NOTE — Progress Notes (Signed)
? ? ?Patient ID: Randy Ballard, male    DOB: [birth year]  MRN: [id number] ? ?CC: Chronic Conditions Follow-Up ? ?Subjective: ?Randy Ballard is a 61 y.o. male who presents for chronic conditions follow-up. ? ?His concerns today include:  ?Hypertension follow-up: ?Doing well on current regimen (HCTZ, Lisinopril, Metoprolol). No side effects. No issues/concerns. Denies chest pain, shortness of breath, worst headache of life and additional red flag symptoms. Reports has home health nurse checking blood pressure at least twice weekly. Readings are 120's-130's/60's-70's. Reports blood pressures at physical therapy are normal as well.  ? ?2. Diabetes type 2 follow-up: ?06/15/2021: ?- Continue Metformin and Glimepiride as prescribed.  ?- Patient self-discontinued Dulaglutide related to side effect of upset stomach. ? ?02/14/2022: ?Doing well on current Metformin and Glimepiride. Reports upset stomach resolved with self-discontinued use of Dulaglutide. ? ?3. Hypothyroidism follow-up: ?06/15/2021: ?- Continue Levothyroxine as prescribed.  ? ?02/14/2022: ?Doing well on current regimen, no issues/concerns.  ? ?4. Cholesterol follow-up: ?Doing well on Rosuvastatin, no issues/concerns.   ? ?Patient Active Problem List  ? Diagnosis Date Noted  ? Colon cancer screening 03/16/2021  ? Anemia 03/16/2021  ? Tobacco abuse 11/06/2014  ? 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 History of pancreatitis 04/16/2013  ? HTN (hypertension) 12/23/2011  ? Type II diabetes mellitus with ophthalmic manifestations ([place]) 12/23/2011  ? Hypothyroid 12/23/2011  ? Dyslipidemia 12/23/2011  ?  ? ?Current Outpatient Medications on File Prior to Visit  ?Medication Sig Dispense Refill  ? Blood Glucose Monitoring Suppl (BLOOD GLUCOSE METER) kit Test blood sugar daily 1 each 0  ? Cholecalciferol (VITAMIN D3) 5000 units CAPS Take 1 capsule (5,000 Units total) by mouth daily.    ? glucose blood test strip Test blood sugar daily. 100 each 3  ? Insulin  Pen Needle (PEN NEEDLES) 31G X 8 MM MISC UAD 100 each 6  ? Lancet Devices (LANCING DEVICE) MISC Test blood sugar daily. 1 each 0  ? ?No current facility-administered medications on file prior to visit.  ? ? ?Allergies  ?Allergen Reactions  ? Januvia [Sitagliptin] Nausea And Vomiting  ?  pancreatitis  ? ? ?Social History  ? ?Socioeconomic History  ? Marital status: Married  ?  Spouse name: Helene Kelp  ? Number of children: 0  ? Years of education: HS  ? Highest education level: Not on file  ?Occupational History  ? Occupation: service rep  ? Occupation: retired  ?  Employer: [employer]  ?Tobacco Use  ? Smoking status: Some Days  ?  Packs/day: 0.50  ?  Years: 32.00  ?  Pack years: 16.00  ?  Types: Cigarettes  ? Smokeless tobacco: Never  ? Tobacco comments:  ?  10/26/15 trying to quit, 12/21/21 smoke on weekends only  ?Vaping Use  ? Vaping Use: Never used  ?Substance and Sexual Activity  ? Alcohol use: No  ?  Alcohol/week: 0.0 standard drinks  ? Drug use: No  ? Sexual activity: Yes  ?  Comment: 1 partner in last 12 months  ?Other Topics Concern  ? Not on file  ?Social History Narrative  ? Marital status:  Married x 20 years; happily.  ?    Children: none  ?    Lives: with wife.  ?    Employment: delivers oxygen; works for [employer]; drives locally, [id number]  ?    Tobacco: trying to quit 10/2012.  ?    Alcohol: None  ?    Drugs: none  ?  Exercise: treadmill sporadic.  ? ?Social Determinants of Health  ? ?Financial Resource Strain: Not on file  ?Food Insecurity: Not on file  ?Transportation Needs: Not on file  ?Physical Activity: Not on file  ?Stress: Not on file  ?Social Connections: Not on file  ?Intimate Partner Violence: Not on file  ? ? ?Family History  ?Adopted: Yes  ?Problem Relation Age of Onset  ? Ataxia Mother 62  ?     SCA3  ? Ataxia Maternal Grandmother   ?     SCA3  ? Ataxia Maternal Aunt   ?     SCA3  ? ? ?Past Surgical History:  ?Procedure Laterality Date  ? BIOPSY THYROID  02/06/2009  ? cold thyroid nodule;  negative/benign.  ? COLONOSCOPY W/ POLYPECTOMY  Feb 2014  ? 5 polyps  ? TOOTH EXTRACTION  10/2015  ? x 4 teeth  ? ? ?ROS: ?Review of Systems ?Negative except as stated above ? ?PHYSICAL EXAM: ?BP (!) 173/67 (BP Location: Left Arm, Patient Position: Sitting, Cuff Size: Large)   Pulse 78   Temp 98.5 ?F (36.9 ?C)   Resp 18   SpO2 98%  ? ?Physical Exam ?HENT:  ?   Head: Normocephalic and atraumatic.  ?Eyes:  ?   Extraocular Movements: Extraocular movements intact.  ?   Conjunctiva/sclera: Conjunctivae normal.  ?   Pupils: Pupils are equal, round, and reactive to light.  ?Cardiovascular:  ?   Rate and Rhythm: Normal rate and regular rhythm.  ?   Pulses: Normal pulses.  ?   Heart sounds: Normal heart sounds.  ?Pulmonary:  ?   Effort: Pulmonary effort is normal.  ?   Breath sounds: Normal breath sounds.  ?Musculoskeletal:  ?   Cervical back: Normal range of motion and neck supple.  ?Neurological:  ?   General: No focal deficit present.  ?   Mental Status: He is alert and oriented to person, place, and time.  ?Psychiatric:     ?   Mood and Affect: Mood normal.     ?   Behavior: Behavior normal.  ? ?Results for orders placed or performed in visit on 02/14/22  ?POCT glycosylated hemoglobin (Hb A1C)  ?Result Value Ref Range  ? Hemoglobin A1C 8.4 (A) 4.0 - 5.6 %  ? HbA1c POC (<> result, manual entry)    ? HbA1c, POC (prediabetic range)    ? HbA1c, POC (controlled diabetic range)    ? ? ?ASSESSMENT AND PLAN: ?1. Essential (primary) hypertension: ?- Blood pressure not at goal during today's visit. Patient asymptomatic without chest pressure, chest pain, palpitations, shortness of breath, worst headache of life, and any additional red flag symptoms. ?- Patient reports home blood pressures 120's-130's/60's-70's. ?- Continue Lisinopril, Hydrochlorothiazide, and Metoprolol Succinate as prescribed.  ?- Counseled on blood pressure goal of less than 130/80, low-sodium, DASH diet, medication compliance, and 150 minutes of moderate  intensity exercise per week as tolerated. Counseled on medication adherence and adverse effects. ?- Update BMP. ?- Follow-up with primary provider in 2 weeks or sooner if needed for blood pressure check. Asked patient to bring home blood pressure monitor with him.  ?- Basic Metabolic Panel ?- lisinopril (ZESTRIL) 40 MG tablet; TAKE 1 TABLET(40 MG) BY MOUTH DAILY  Dispense: 120 tablet; Refill: 0 ?- hydrochlorothiazide (HYDRODIURIL) 25 MG tablet; TAKE 1 TABLET(25 MG) BY MOUTH DAILY  Dispense: 120 tablet; Refill: 0 ?- metoprolol succinate (TOPROL-XL) 100 MG 24 hr tablet; TAKE 1 TABLET BY MOUTH EVERY DAY IMMEDIATELY FOLLOWING A  MEAL  Dispense: 120 tablet; Refill: 0 ? ?2. Uncontrolled type 2 diabetes mellitus with hyperglycemia ([place]): ?- Hemoglobin A1c today not at goal at 8.4%, goal < 7%.  ?- Continue Glimepiride and Metformin as prescribed.  ?- Dulaglutide caused gastrointestinal side effects and patient self-discontinued.  ?- Discussed the importance of healthy eating habits, low-carbohydrate diet, low-sugar diet, regular aerobic exercise (at least 150 minutes a week as tolerated) and medication compliance to achieve or maintain control of diabetes. ?- Referral to Endocrinology for further evaluation and management.  ?- POCT glycosylated hemoglobin (Hb A1C) ?- glimepiride (AMARYL) 4 MG tablet; TAKE 2 TABLETS(8 MG) BY MOUTH DAILY BEFORE BREAKFAST  Dispense: 60 tablet; Refill: 3 ?- metFORMIN (GLUCOPHAGE) 500 MG tablet; TAKE 2 TABLETS(1000 MG) BY MOUTH TWICE DAILY WITH A MEAL  Dispense: 120 tablet; Refill: 3 ?- Ambulatory referral to Endocrinology ? ?3. Dyslipidemia: ?- Continue Rosuvastatin as prescribed.  ?- Update lipid panel.  ?- Lipid Panel ?- rosuvastatin (CRESTOR) 5 MG tablet; TAKE 1 TABLET(5 MG) BY MOUTH AT BEDTIME  Dispense: 180 tablet; Refill: 0 ? ?4. Hypothyroidism, unspecified type: ?- Continue Levothyroxine as prescribed.  ?- Update thyroid lab. ?- TSH ?- levothyroxine (SYNTHROID) 150 MCG tablet; TAKE 1  TABLET(150 MCG) BY MOUTH DAILY BEFORE BREAKFAST  Dispense: 180 tablet; Refill: 0 ? ? ? ?Patient was given the opportunity to ask questions.  Patient verbalized understanding of the plan and was able to repeat key ele

## 2022-02-13 ENCOUNTER — Other Ambulatory Visit: Payer: Federal, State, Local not specified - PPO

## 2022-02-14 ENCOUNTER — Ambulatory Visit: Payer: Medicare Other | Admitting: Family

## 2022-02-14 ENCOUNTER — Encounter: Payer: Self-pay | Admitting: Family

## 2022-02-14 VITALS — BP 173/67 | HR 78 | Temp 98.5°F | Resp 18

## 2022-02-14 DIAGNOSIS — E785 Hyperlipidemia, unspecified: Secondary | ICD-10-CM | POA: Diagnosis not present

## 2022-02-14 DIAGNOSIS — I10 Essential (primary) hypertension: Secondary | ICD-10-CM | POA: Diagnosis not present

## 2022-02-14 DIAGNOSIS — E039 Hypothyroidism, unspecified: Secondary | ICD-10-CM

## 2022-02-14 DIAGNOSIS — E1165 Type 2 diabetes mellitus with hyperglycemia: Secondary | ICD-10-CM

## 2022-02-14 DIAGNOSIS — E113299 Type 2 diabetes mellitus with mild nonproliferative diabetic retinopathy without macular edema, unspecified eye: Secondary | ICD-10-CM

## 2022-02-14 LAB — POCT GLYCOSYLATED HEMOGLOBIN (HGB A1C): Hemoglobin A1C: 8.4 % — AB (ref 4.0–5.6)

## 2022-02-14 MED ORDER — LEVOTHYROXINE SODIUM 150 MCG PO TABS: ORAL_TABLET | ORAL | 0 refills | Status: DC

## 2022-02-14 MED ORDER — METFORMIN HCL 500 MG PO TABS: ORAL_TABLET | ORAL | 3 refills | Status: DC

## 2022-02-14 MED ORDER — GLIMEPIRIDE 4 MG PO TABS: ORAL_TABLET | ORAL | 3 refills | Status: DC

## 2022-02-14 MED ORDER — ROSUVASTATIN CALCIUM 5 MG PO TABS: ORAL_TABLET | ORAL | 0 refills | Status: DC

## 2022-02-14 MED ORDER — METOPROLOL SUCCINATE ER 100 MG PO TB24: ORAL_TABLET | ORAL | 0 refills | Status: DC

## 2022-02-14 MED ORDER — LISINOPRIL 40 MG PO TABS: ORAL_TABLET | ORAL | 0 refills | Status: DC

## 2022-02-14 MED ORDER — HYDROCHLOROTHIAZIDE 25 MG PO TABS: ORAL_TABLET | ORAL | 0 refills | Status: DC

## 2022-02-14 NOTE — Progress Notes (Signed)
Diabetes discussed in office.

## 2022-02-14 NOTE — Progress Notes (Signed)
Pt presents for hypertension follow-up  ?

## 2022-02-15 ENCOUNTER — Other Ambulatory Visit: Payer: Self-pay | Admitting: Family

## 2022-02-15 DIAGNOSIS — I10 Essential (primary) hypertension: Secondary | ICD-10-CM

## 2022-02-17 LAB — TSH: TSH: 0.145 u[IU]/mL — ABNORMAL LOW (ref 0.450–4.500)

## 2022-02-17 LAB — BASIC METABOLIC PANEL
BUN/Creatinine Ratio: 13 (ref 10–24)
BUN: 11 mg/dL (ref 8–27)
CO2: 26 mmol/L (ref 20–29)
Calcium: 9.3 mg/dL (ref 8.6–10.2)
Chloride: 99 mmol/L (ref 96–106)
Creatinine, Ser: 0.87 mg/dL (ref 0.76–1.27)
Glucose: 233 mg/dL — ABNORMAL HIGH (ref 70–99)
Potassium: 4.6 mmol/L (ref 3.5–5.2)
Sodium: 141 mmol/L (ref 134–144)
eGFR: 98 mL/min/{1.73_m2} (ref >59)

## 2022-02-17 LAB — LIPID PANEL
Chol/HDL Ratio: 5.5 ratio — ABNORMAL HIGH (ref 0.0–5.0)
Cholesterol, Total: 191 mg/dL (ref 100–199)
HDL: 35 mg/dL — ABNORMAL LOW (ref >39)
LDL Chol Calc (NIH): 120 mg/dL — ABNORMAL HIGH (ref 0–99)
Triglycerides: 202 mg/dL — ABNORMAL HIGH (ref 0–149)
VLDL Cholesterol Cal: 36 mg/dL (ref 5–40)

## 2022-02-17 LAB — SPECIMEN STATUS REPORT

## 2022-02-18 ENCOUNTER — Other Ambulatory Visit: Payer: Self-pay | Admitting: Family

## 2022-02-18 DIAGNOSIS — E0590 Thyrotoxicosis, unspecified without thyrotoxic crisis or storm: Secondary | ICD-10-CM

## 2022-02-18 DIAGNOSIS — E039 Hypothyroidism, unspecified: Secondary | ICD-10-CM

## 2022-02-18 MED ORDER — LEVOTHYROXINE SODIUM 125 MCG PO TABS: 125.0000 ug | ORAL_TABLET | Freq: Every day | ORAL | 0 refills | Status: DC

## 2022-02-18 NOTE — Progress Notes (Signed)
-   Kidney function and electrolytes normal.  ?- Thyroid function lower than normal. Decrease Levothyroxine to 125 mcg daily. Referral to Endocrinology for further evaluation and management. Expect a call within 2 weeks with appointment details.  ?- Cholesterol remaining higher than normal. Increase Rosuvastatin to 10 mg daily. Patient can take 2 tablets of current 5 mg to equal increased dose.

## 2022-02-19 ENCOUNTER — Other Ambulatory Visit: Payer: Self-pay | Admitting: Family

## 2022-02-19 DIAGNOSIS — E039 Hypothyroidism, unspecified: Secondary | ICD-10-CM

## 2022-02-21 ENCOUNTER — Telehealth: Payer: Self-pay | Admitting: Diagnostic Neuroimaging

## 2022-02-21 NOTE — Telephone Encounter (Signed)
[facility] Tresa Endo) requesting verbal orders for PT. ?Frequency: ?1x wk for 8 wks ?

## 2022-02-21 NOTE — Telephone Encounter (Signed)
Ontacted Kelly back, left secured VM giving VO for PT 1x for 8wks.  ?

## 2022-03-07 ENCOUNTER — Ambulatory Visit: Payer: Medicare Other | Admitting: Family

## 2022-03-11 NOTE — Progress Notes (Addendum)
Patient ID: Randy Ballard, male    DOB: 1961/02/23  MRN: [id number]  CC: Mobility Examination   Subjective: Randy Ballard is a 61 y.o. male who presents for mobility examination. Received a Hoveround application at home in the mail.   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  2. Medical Conditions / Symptoms - that limit your ability: - Spinocerebellar ataxia type 3 - Lumbar degenerative disc disease - Reports unable to ambulate more than 10 feet without having to sit and rest.  - Frequent falls  3. Mobility related activities of daily living that are limited in the home: - Patient needs to PMD to assist with all activities in the home including but not limited to getting to the bathroom to toilet / bathe; get to kitchen to prepare meals / cook / eat; get to the bedroom to groom / dress.  4. Kasandra Knudsen / Gilford Rile - why this won't medically meet your needs: - This will not medically meet needs due to imbalance during ambulation secondary to gait disturbance / spinocerebellar ataxia type 3.  5. Manual Wheelchair - why this won't medically meet your needs: - This will not medically meet needs due to left shoulder pain with decreased range of motion inhibiting manual wheelchair use.  6. Scooter (POV) - why this won't medically meet your needs: - Gait disturbance secondary to spinocerebellar ataxia type 3. Also, utilization of scooter is not as flexible compared to PMD.    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     glimepiride (AMARYL) 4 MG tablet TAKE 2 TABLETS(8 MG) BY MOUTH DAILY BEFORE BREAKFAST 60 tablet 3   hydrochlorothiazide (HYDRODIURIL) 25 MG tablet TAKE 1 TABLET(25 MG) BY MOUTH DAILY 120 tablet 0   levothyroxine (SYNTHROID) 112 MCG tablet Take 112 mcg by mouth daily.     levothyroxine (SYNTHROID) 125 MCG tablet Take 1 tablet (125 mcg total) by mouth daily before breakfast. TAKE 1 TABLET(150 MCG) BY MOUTH DAILY BEFORE BREAKFAST 90 tablet 0   lisinopril (ZESTRIL) 40 MG tablet TAKE 1 TABLET(40 MG) BY MOUTH DAILY 120 tablet 0   metFORMIN (GLUCOPHAGE) 500 MG tablet TAKE 2 TABLETS(1000 MG) BY MOUTH TWICE DAILY WITH A MEAL 120 tablet 3   metoprolol succinate (TOPROL-XL) 100 MG 24 hr tablet TAKE 1 TABLET BY MOUTH EVERY DAY IMMEDIATELY FOLLOWING A MEAL 120 tablet 0   rosuvastatin (CRESTOR) 5 MG tablet TAKE 1 TABLET(5 MG) BY MOUTH AT BEDTIME 180 tablet 0   No current facility-administered medications on file prior to visit.    Allergies  Allergen Reactions   Januvia [Sitagliptin] Nausea And Vomiting    pancreatitis    Social History   Socioeconomic History   Marital status: Married    Spouse name: Helene Kelp   Number of children: 0  Years of education: HS   Highest education level: Not on file  Occupational History   Occupation: service rep   Occupation: retired    Fish farm manager: Musician  Tobacco Use   Smoking status: Every Day    Packs/day: 0.50    Years: 32.00    Pack years: 16.00    Types: Cigarettes    Passive exposure: Current   Smokeless tobacco: Never   Tobacco comments:    10/26/15 trying to quit, 12/21/21 smoke on weekends only  Vaping Use   Vaping Use: Never used  Substance and Sexual Activity   Alcohol use: No    Alcohol/week: 0.0 standard  drinks   Drug use: No   Sexual activity: Yes    Comment: 1 partner in last 12 months  Other Topics Concern   Not on file  Social History Narrative   Marital status:  Married x 20 years; happily.      Children: none      Lives: with wife.      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      Alcohol: None      Drugs: none      Exercise: treadmill sporadic.   Social Determinants of Health   Financial Resource Strain: Not on file  Food Insecurity: Not on file  Transportation Needs: Not on file  Physical Activity: Not on file  Stress: Not on file  Social Connections: Not on file  Intimate Partner Violence: Not on file    Family History  Adopted: Yes  Problem Relation Age of Onset   Ataxia Mother 29       SCA3   Ataxia Maternal Grandmother        SCA3   Ataxia Maternal Aunt        SCA3    Past Surgical History:  Procedure Laterality Date   BIOPSY THYROID  02/06/2009   cold thyroid nodule; negative/benign.   COLONOSCOPY W/ POLYPECTOMY  Feb 2014   5 polyps   TOOTH EXTRACTION  10/2015   x 4 teeth    ROS: Review of Systems Negative except as stated above  PHYSICAL EXAM: BP 130/82 (BP Location: Left Arm, Patient Position: Sitting, Cuff Size: Normal) Comment: Manual reading  Pulse 74   Temp 98.3 F (36.8 C)   Resp 16   Ht 5\' 8"  (1.727 m)   Wt 199 lb (90.3 kg)   SpO2 98%   BMI 30.26 kg/m   Physical Exam HENT:     Head: Normocephalic and atraumatic.  Eyes:     Extraocular Movements: Extraocular movements intact.     Conjunctiva/sclera: Conjunctivae normal.     Pupils: Pupils are equal, round, and reactive to light.  Cardiovascular:     Rate and Rhythm: Normal rate and regular rhythm.     Pulses: Normal pulses.     Heart sounds: Normal heart sounds.  Pulmonary:     Effort: Pulmonary effort is normal.     Breath sounds: Normal breath sounds.  Musculoskeletal:     Right shoulder: Normal.      Left shoulder: Normal.     Right upper arm: Normal.     Left upper arm: Normal.     Right elbow: Normal.     Left elbow: Normal.     Right forearm: Normal.     Left forearm: Normal.     Right wrist: Normal.     Left wrist:  Normal.     Right hand: Normal.     Cervical back: Normal, normal range of motion and neck supple.     Thoracic back: Normal.     Lumbar back: Tenderness present.     Right knee: Normal.     Left knee: Normal.     Right lower leg: Normal.     Left lower leg: Normal.     Right ankle: Swelling present.     Left ankle: Swelling present.     Right foot: Normal.     Left foot: Normal.     Comments: Left hand with mild contraction. Active range of motion of left hand and right upper / bilateral lower extremity. Right upper / bilateral lower extremity strength 5/5; active range of motion right upper / lower bilaterally; bilateral hand grip strong. Bilateral ankles 1+ pitting edema. He has the ability to shift his weight. Endorses pain at lumbar spine. Remaining musculoskeletal examination patient denies pain and discomfort of right upper / lower extremity bilaterally. This excludes left shoulder which is painful with decreased range of motion of flexion, extension, abduction, adduction, lateral rotation, and medial rotation. Pain scale left shoulder 8/10.  Skin:    General: Skin is warm and dry.     Comments: No history of pressure sores.   Neurological:     General: No focal deficit present.     Mental Status: He is alert and oriented to person, place, and time.     Gait: Gait abnormal.     Comments: Ataxic / shuffling gait. Requires moderate level of assistance.  Psychiatric:        Mood and Affect: Mood normal.        Behavior: Behavior normal.     ASSESSMENT AND PLAN: 1. Encounter for power mobility device assessment - Patient has the physical and mental abilities to operate a power mobility device. - Patient is willing and motivated to use a power mobility  device.   Patient was given the opportunity to ask questions.  Patient verbalized understanding of the plan and was able to repeat key elements of the plan. Patient was given clear instructions to go to Emergency Department or return to medical center if symptoms don't improve, worsen, or new problems develop.The patient verbalized understanding.  Follow-up with primary provider as scheduled.  Camillia Herter, NP

## 2022-03-15 ENCOUNTER — Encounter: Payer: Self-pay | Admitting: Family

## 2022-03-15 ENCOUNTER — Ambulatory Visit (INDEPENDENT_AMBULATORY_CARE_PROVIDER_SITE_OTHER): Payer: Medicare Other | Admitting: Family

## 2022-03-15 VITALS — BP 130/82 | HR 74 | Temp 98.3°F | Resp 16 | Ht 68.0 in | Wt 199.0 lb

## 2022-03-15 DIAGNOSIS — Z7689 Persons encountering health services in other specified circumstances: Secondary | ICD-10-CM | POA: Diagnosis not present

## 2022-04-06 ENCOUNTER — Telehealth: Payer: Self-pay

## 2022-04-06 NOTE — Telephone Encounter (Signed)
Forms completed for Hoveround power chair, Step 4- required to call clinical documentation dept [phone] before faxing spoke w/Linda confirmed patient and updated their system to [facility] for forms

## 2022-04-07 ENCOUNTER — Telehealth: Payer: Self-pay | Admitting: Family

## 2022-04-07 NOTE — Telephone Encounter (Signed)
Copied from CRM [id number]. Topic: General - Other >> Apr 07, 2022 10:20 AM Turkey B wrote: Reason for [id number] from [facility] called in to speak with nurse about patient being able to use a manual power wheelchair, wouldn't give any more details, says its complicated. Please call back

## 2022-04-07 NOTE — Telephone Encounter (Signed)
Att to contact pt to verify why he can not manually operate a manual wheelchair for example shoulder pain,  Information is needed for [employer] to complete application for powered mobility chair

## 2022-04-13 NOTE — Telephone Encounter (Signed)
Addendum complete

## 2022-04-13 NOTE — Telephone Encounter (Signed)
Spoke to pt states that his left shoulder has been giving him issues since he been in wheelchair, states that its painful and his range of motion is limited. Adv pt he should of made provider aware at last visit states it slipped his mind

## 2022-04-20 ENCOUNTER — Telehealth: Payer: Self-pay | Admitting: Family

## 2022-04-20 NOTE — Telephone Encounter (Signed)
Linda from [facility] called in wanted to discuss progress notes that were sent for exam on 06/07. Please call back

## 2022-04-20 NOTE — Telephone Encounter (Signed)
Spoke w/Brittany from [facility] need to add a pain level to left shoulder pain and range of motion

## 2022-04-20 NOTE — Telephone Encounter (Signed)
Progress notes refaxed to [facility]

## 2022-05-08 NOTE — Telephone Encounter (Signed)
Benny calling from [facility] is calling to followup on the request for a powerchair. Please advise CB- [phone]

## 2022-05-08 NOTE — Telephone Encounter (Signed)
Addendum progress note refaxed

## 2022-05-14 NOTE — Progress Notes (Addendum)
Virtual Visit via Video Note  I connected with Randy Ballard, on 05/16/2022 at 4:09 PM by video and verified that I am speaking with the correct person using two identifiers.  Consent: I discussed the limitations, risks, security and privacy concerns of performing an evaluation and management service by telephone and the availability of in person appointments. I also discussed with the patient that there may be a patient responsible charge related to this service. The patient expressed understanding and agreed to proceed.   Location of Patient: Home  Location of Provider: [facility]   Persons participating in Telemedicine visit: Randy SQUARE [name], NP Randy Ballard, CMA   History of Present Illness: Randy Ballard is a 61 year-old male who presents for mobility examination paperwork update. Received a [employer] application at home in the mail.   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   2. Medical Conditions / Symptoms - that limit your ability: - Spinocerebellar ataxia type 3 - Lumbar degenerative disc disease - Reports unable to ambulate more than 10 feet without having to sit and rest.  - Frequent falls   3. Mobility related activities of daily living that are limited in the home: - Patient needs to PMD to assist with all activities in the home including but not limited to getting to the bathroom to toilet / bathe; get to kitchen to prepare meals / cook / eat; get to the bedroom to groom / dress.   4. Gilmer Mor / Dan Humphreys - why this won't medically meet your needs: - This will not medically meet needs due to imbalance during ambulation secondary to gait disturbance / spinocerebellar ataxia type 3.   5. Manual Wheelchair - why this won't medically meet your needs: - This will not medically meet needs due to left shoulder pain with  decreased range of motion inhibiting manual wheelchair use. Pain scale left shoulder 8/10.    6. Scooter (POV) - why this won't medically meet your needs: - Gait disturbance secondary to spinocerebellar ataxia type 3. Also, utilization of scooter is not as flexible compared to PMD.  Physical Exam HENT:     Head: Normocephalic and atraumatic.  Eyes:     Extraocular Movements: Extraocular movements intact.     Conjunctiva/sclera: Conjunctivae normal.     Pupils: Pupils are equal, round, and reactive to light.  Cardiovascular:     Rate and Rhythm: Normal rate and regular rhythm.     Pulses: Normal pulses.     Heart sounds: Normal heart sounds.  Pulmonary:     Effort: Pulmonary effort is normal.     Breath sounds: Normal breath sounds.  Musculoskeletal:     Right shoulder: Normal.     Left shoulder: Normal.     Right upper arm: Normal.     Left upper arm: Normal.     Right elbow: Normal.     Left elbow: Normal.     Right forearm: Normal.     Left forearm: Normal.     Right wrist: Normal.     Left wrist: Normal.     Right hand: Normal.     Cervical back: Normal, normal range of motion and neck supple.     Thoracic back: Normal.     Lumbar back: Tenderness present.     Right knee: Normal.     Left knee: Normal.  Right lower leg: Normal.     Left lower leg: Normal.     Right ankle: Swelling present.     Left ankle: Swelling present.     Right foot: Normal.     Left foot: Normal.     Comments: Left hand with mild contraction. Active range of motion of left hand and right upper / bilateral lower extremity. Right upper / bilateral lower extremity strength 5/5; active range of motion right upper / lower bilaterally; bilateral hand grip strong. Bilateral ankles 1+ pitting edema. He has the ability to shift his weight. Endorses pain at lumbar spine. Remaining musculoskeletal examination patient denies pain and discomfort of right upper / lower extremity bilaterally. This excludes left  shoulder which is painful with decreased range of motion of flexion, extension, abduction, adduction, lateral rotation, and medial rotation. Pain scale left shoulder 8/10.  Skin:    General: Skin is warm and dry.     Comments: No history of pressure sores.   Neurological:     General: No focal deficit present.     Mental Status: He is alert and oriented to person, place, and time.     Gait: Gait abnormal.     Comments: Ataxic / shuffling gait. Requires moderate level of assistance.  Psychiatric:        Mood and Affect: Mood normal.        Behavior: Behavior normal.   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   2. Medical Conditions / Symptoms - that limit your ability: - Spinocerebellar ataxia type 3 - Lumbar degenerative disc disease - Reports unable to ambulate more than 10 feet without having to sit and rest.  - Frequent falls   3. Mobility related activities of daily living that are limited in the home: - Patient needs to PMD to assist with all activities in the home including but not limited to getting to the bathroom to toilet / bathe; get to kitchen to prepare meals / cook / eat; get to the bedroom to groom / dress.   4. Gilmer Morane / Dan HumphreysWalker - why this won't medically meet your needs: - This will not medically meet needs due to imbalance during ambulation secondary to gait disturbance / spinocerebellar ataxia type 3.   5. Manual Wheelchair - why this won't medically meet your needs: - This will not medically meet needs due to manual wheelchair being too bulky for daily use within the home. For example reports hitting walls in the home when trying to turn.    6. Scooter (POV) - why this won't medically meet your needs: - Gait disturbance secondary to spinocerebellar ataxia type 3. Also, utilization of scooter is not as flexible compared to PMD.  Physical Exam HENT:     Head:  Normocephalic and atraumatic.  Eyes:     Extraocular Movements: Extraocular movements intact.     Conjunctiva/sclera: Conjunctivae normal.     Pupils: Pupils are equal, round, and reactive to light.  Cardiovascular:     Rate and Rhythm: Normal rate and regular rhythm.     Pulses: Normal pulses.     Heart sounds: Normal heart sounds.  Pulmonary:     Effort: Pulmonary effort is normal.     Breath sounds: Normal breath sounds.  Musculoskeletal:     Right shoulder: Normal.     Left shoulder: Normal.     Right upper arm: Normal.  Left upper arm: Normal.     Right elbow: Normal.     Left elbow: Normal.     Right forearm: Normal.     Left forearm: Normal.     Right wrist: Normal.     Left wrist: Normal.     Right hand: Normal.     Cervical back: Normal, normal range of motion and neck supple.     Thoracic back: Normal.     Lumbar back: Tenderness present.     Right knee: Normal.     Left knee: Normal.     Right lower leg: Normal.     Left lower leg: Normal.     Right ankle: Swelling present.     Left ankle: Swelling present.     Right foot: Normal.     Left foot: Normal.     Comments: Left hand with mild contraction. However, he does have good range of motion of left hand and bilateral upper / lower extremity. Bilateral upper / lower extremity strength 5/5; good range of motion upper / lower bilaterally; bilateral hand grip strong. Bilateral ankles 1+ pitting edema. He has the ability to shift his weight. Endorses pain at lumbar spine. Remaining musculoskeletal examination patient denies pain and discomfort upper / lower extremity bilaterally.  Skin:    General: Skin is warm and dry.     Comments: No history of pressure sores.   Neurological:     General: No focal deficit present.     Mental Status: He is alert and oriented to person, place, and time.     Gait: Gait abnormal.     Comments: Ataxic / shuffling gait. Requires moderate level of assistance.  Psychiatric:         Mood and Affect: Mood normal.        Behavior: Behavior normal.   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   2. Medical Conditions / Symptoms - that limit your ability: - Spinocerebellar ataxia type 3 - Lumbar degenerative disc disease - Reports unable to ambulate more than 10 feet without having to sit and rest.  - Frequent falls   3. Mobility related activities of daily living that are limited in the home: - Patient needs to PMD to assist with all activities in the home including but not limited to getting to the bathroom to toilet / bathe; get to kitchen to prepare meals / cook / eat; get to the bedroom to groom / dress.   4. Gilmer Mor / Dan Humphreys - why this won't medically meet your needs: - This will not medically meet needs due to imbalance during ambulation secondary to gait disturbance / spinocerebellar ataxia type 3.   5. Manual Wheelchair - why this won't medically meet your needs: - This will not medically meet needs due to manual wheelchair being too bulky for daily use within the home. For example reports hitting walls in the home when trying to turn.    6. Scooter (POV) - why this won't medically meet your needs: - Gait disturbance secondary to spinocerebellar ataxia type 3. Also, utilization of scooter is not as flexible compared to PMD.   Physical Exam HENT:     Head: Normocephalic and atraumatic.  Eyes:     Extraocular Movements: Extraocular movements intact.     Conjunctiva/sclera: Conjunctivae normal.     Pupils: Pupils are equal, round, and reactive to light.  Cardiovascular:  Rate and Rhythm: Normal rate and regular rhythm.     Pulses: Normal pulses.     Heart sounds: Normal heart sounds.  Pulmonary:     Effort: Pulmonary effort is normal.     Breath sounds: Normal breath sounds.  Musculoskeletal:     Right shoulder: Normal.     Left shoulder: Normal.     Right  upper arm: Normal.     Left upper arm: Normal.     Right elbow: Normal.     Left elbow: Normal.     Right forearm: Normal.     Left forearm: Normal.     Right wrist: Normal.     Left wrist: Normal.     Right hand: Normal.     Cervical back: Normal, normal range of motion and neck supple.     Thoracic back: Normal.     Lumbar back: Tenderness present.     Right knee: Normal.     Left knee: Normal.     Right lower leg: Normal.     Left lower leg: Normal.     Right ankle: Swelling present.     Left ankle: Swelling present.     Right foot: Normal.     Left foot: Normal.     Comments: Left hand with mild contraction. However, he does have good range of motion of left hand and right upper / bilateral lower extremity. Right upper / bilateral lower extremity strength 5/5; good range of motion right upper / lower bilaterally; bilateral hand grip strong. Bilateral ankles 1+ pitting edema. He has the ability to shift his weight. Endorses pain at lumbar spine. Remaining musculoskeletal examination patient denies pain and discomfort of right upper / lower extremity bilaterally. This excludes left shoulder which is painful with decreased range of motion.   Skin:    General: Skin is warm and dry.     Comments: No history of pressure sores.   Neurological:     General: No focal deficit present.     Mental Status: He is alert and oriented to person, place, and time.     Gait: Gait abnormal.     Comments: Ataxic / shuffling gait. Requires moderate level of assistance.  Psychiatric:        Mood and Affect: Mood normal.        Behavior: Behavior normal.    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   2. Medical Conditions / Symptoms - that limit your ability: - Spinocerebellar ataxia type 3 - Lumbar degenerative disc disease - Reports unable to ambulate more than 10 feet without having to sit and  rest.  - Frequent falls   3. Mobility related activities of daily living that are limited in the home: - Patient needs to PMD to assist with all activities in the home including but not limited to getting to the bathroom to toilet / bathe; get to kitchen to prepare meals / cook / eat; get to the bedroom to groom / dress.   4. Gilmer Mor / Dan Humphreys - why this won't medically meet your needs: - This will not medically meet needs due to imbalance during ambulation secondary to gait disturbance / spinocerebellar ataxia type 3.   5. Manual Wheelchair - why this won't medically meet your needs: - This will not medically meet needs due to left shoulder pain with decreased range of motion inhibiting manual wheelchair use.  6. Scooter (POV) - why this won't medically meet your needs: - Gait disturbance secondary to spinocerebellar ataxia type 3. Also, utilization of scooter is not as flexible compared to PMD.  Physical Exam HENT:     Head: Normocephalic and atraumatic.  Eyes:     Extraocular Movements: Extraocular movements intact.     Conjunctiva/sclera: Conjunctivae normal.     Pupils: Pupils are equal, round, and reactive to light.  Cardiovascular:     Rate and Rhythm: Normal rate and regular rhythm.     Pulses: Normal pulses.     Heart sounds: Normal heart sounds.  Pulmonary:     Effort: Pulmonary effort is normal.     Breath sounds: Normal breath sounds.  Musculoskeletal:     Right shoulder: Normal.     Left shoulder: Normal.     Right upper arm: Normal.     Left upper arm: Normal.     Right elbow: Normal.     Left elbow: Normal.     Right forearm: Normal.     Left forearm: Normal.     Right wrist: Normal.     Left wrist: Normal.     Right hand: Normal.     Cervical back: Normal, normal range of motion and neck supple.     Thoracic back: Normal.     Lumbar back: Tenderness present.     Right knee: Normal.     Left knee: Normal.     Right lower leg: Normal.     Left lower leg:  Normal.     Right ankle: Swelling present.     Left ankle: Swelling present.     Right foot: Normal.     Left foot: Normal.     Comments: Left hand with mild contraction. However, he does have good range of motion of left hand and right upper / bilateral lower extremity. Right upper / bilateral lower extremity strength 5/5; good range of motion right upper / lower bilaterally; bilateral hand grip strong. Bilateral ankles 1+ pitting edema. He has the ability to shift his weight. Endorses pain at lumbar spine. Remaining musculoskeletal examination patient denies pain and discomfort of right upper / lower extremity bilaterally. This excludes left shoulder which is painful with decreased range of motion.   Skin:    General: Skin is warm and dry.     Comments: No history of pressure sores.   Neurological:     General: No focal deficit present.     Mental Status: He is alert and oriented to person, place, and time.     Gait: Gait abnormal.     Comments: Ataxic / shuffling gait. Requires moderate level of assistance.  Psychiatric:        Mood and Affect: Mood normal.        Behavior: Behavior normal.   05/16/2022: Patient reports in addition to the aforementioned concerns he is having contracting bilateral hand pain.   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     empagliflozin (JARDIANCE) 10 MG TABS tablet 1 tablet     glimepiride  (AMARYL) 4 MG tablet TAKE 2 TABLETS(8 MG) BY MOUTH DAILY BEFORE BREAKFAST 60 tablet 3   hydrochlorothiazide (HYDRODIURIL) 25 MG tablet TAKE 1 TABLET(25 MG) BY MOUTH DAILY 120 tablet 0   levothyroxine (SYNTHROID) 112 MCG tablet Take 112 mcg by mouth daily.     levothyroxine (SYNTHROID) 125 MCG tablet Take 1 tablet (125 mcg total) by mouth daily before breakfast. TAKE 1 TABLET(150 MCG) BY MOUTH DAILY BEFORE BREAKFAST 90 tablet 0   lisinopril (ZESTRIL) 40 MG tablet TAKE 1 TABLET(40 MG) BY MOUTH DAILY 120 tablet 0   metFORMIN (GLUCOPHAGE) 500 MG tablet TAKE 2 TABLETS(1000 MG) BY MOUTH TWICE DAILY WITH A MEAL 120 tablet 3   metoprolol succinate (TOPROL-XL) 100 MG 24 hr tablet TAKE 1 TABLET BY MOUTH EVERY DAY IMMEDIATELY FOLLOWING A MEAL 120 tablet 0   rosuvastatin (CRESTOR) 5 MG tablet TAKE 1 TABLET(5 MG) BY MOUTH AT BEDTIME 180 tablet 0   No current facility-administered medications on file prior to visit.    Observations/Objective: Alert and oriented x 3. Not in acute distress. Physical examination not completed as this is a telemedicine visit.  Assessment and Plan: Encounter for power mobility device assessment - Patient has the physical and mental abilities to operate a power mobility device. - Patient is willing and motivated to use a power mobility device.    Follow Up Instructions: Follow-up with primary provider as scheduled.   Patient was given clear instructions to go to Emergency Department or return to medical center if symptoms don't improve, worsen, or new problems develop.The patient verbalized understanding.  I discussed the assessment and treatment plan with the patient. The patient was provided an opportunity to ask questions and all were answered. The patient agreed with the plan and demonstrated an understanding of the instructions.   The patient was advised to call back or seek an in-person evaluation if the symptoms worsen or if the condition fails to improve as  anticipated.    I provided 5 minutes total of non-face-to-face time during this encounter.   [name], NP  [facility] at [facility] [place], [place] [phone] 05/16/2022, 4:09 PM

## 2022-05-16 ENCOUNTER — Ambulatory Visit (INDEPENDENT_AMBULATORY_CARE_PROVIDER_SITE_OTHER): Payer: Medicare Other | Admitting: Family

## 2022-05-16 DIAGNOSIS — Z7689 Persons encountering health services in other specified circumstances: Secondary | ICD-10-CM

## 2022-05-18 ENCOUNTER — Other Ambulatory Visit: Payer: Self-pay | Admitting: *Deleted

## 2022-05-18 ENCOUNTER — Telehealth: Payer: Self-pay | Admitting: *Deleted

## 2022-05-18 DIAGNOSIS — G959 Disease of spinal cord, unspecified: Secondary | ICD-10-CM

## 2022-05-18 DIAGNOSIS — G118 Other hereditary ataxias: Secondary | ICD-10-CM

## 2022-05-18 DIAGNOSIS — R29898 Other symptoms and signs involving the musculoskeletal system: Secondary | ICD-10-CM

## 2022-05-18 NOTE — Telephone Encounter (Signed)
Kathlene November from [facility] has called and states that they sent over a fax this am and need that final signature before delivery. Please note there is another T/E ongoing with Neurology and there is a verbal order from [facility] for a chair also dated this month.  Please advise. [employer] wants contact at [phone]

## 2022-05-18 NOTE — Telephone Encounter (Signed)
Received fax form [facility] re: HH recommends patient have motorized wheelchair due to ataxia and weakness. Rx for wheelchair on MD desk for signature. Patient last seen 12/21/21, will likely need another face to face visit for order to be processed by [facility].

## 2022-05-18 NOTE — Telephone Encounter (Signed)
PCP in process of completing [employer] paperwork from phone visit on 08/07

## 2022-05-18 NOTE — Telephone Encounter (Signed)
Electric wheelchair order signed. Order, demographics and last office note faxed to [facility].  Received confirmation.

## 2022-05-24 NOTE — Telephone Encounter (Signed)
Bonita Quin from [facility]  Phone # [phone]   Caller would like to know if video was used during 05/16/2022 date of service. Caller would like a follow up call

## 2022-05-24 NOTE — Telephone Encounter (Signed)
Provider will updated progress note

## 2022-06-01 ENCOUNTER — Telehealth: Payer: Self-pay | Admitting: Family

## 2022-06-01 NOTE — Telephone Encounter (Signed)
Provider has completed signing forms, completed signed documentation will be faxed today to [facility].

## 2022-06-01 NOTE — Telephone Encounter (Signed)
Copied from CRM [id number]. Topic: General - Other >> Jun 01, 2022 10:19 AM Macon Large wrote: Reason for CRM: Randy Ballard with [employer] called for an update on the fax sent in on 05/30/22. Cb# [phone]

## 2022-06-14 NOTE — Telephone Encounter (Signed)
Received fax form [facility] re: custom mobility rehab team, certificate of medical necessity for wheelchair eval by PT. Signed by MD, faxed to [facility]. Received confirmation.

## 2022-06-22 ENCOUNTER — Telehealth: Payer: Self-pay | Admitting: Diagnostic Neuroimaging

## 2022-06-22 NOTE — Telephone Encounter (Signed)
Called Kelly, LVM and gave verbal order as requested.

## 2022-06-22 NOTE — Telephone Encounter (Signed)
Randy Ballard @ [facility] is requesting verbal orders to extend Home PT for 1 week 8 Her vm is secure, verbal orders can be left at  [phone]

## 2022-06-25 ENCOUNTER — Other Ambulatory Visit: Payer: Self-pay | Admitting: Family

## 2022-06-25 DIAGNOSIS — I10 Essential (primary) hypertension: Secondary | ICD-10-CM

## 2022-06-27 MED ORDER — METOPROLOL SUCCINATE ER 100 MG PO TB24: ORAL_TABLET | ORAL | 0 refills | Status: DC

## 2022-07-04 ENCOUNTER — Telehealth: Payer: Self-pay

## 2022-07-04 NOTE — Telephone Encounter (Signed)
[facility] faxed over orders for MD to sign. MD signed and faxed back.

## 2022-07-27 ENCOUNTER — Other Ambulatory Visit: Payer: Self-pay

## 2022-07-27 DIAGNOSIS — E1165 Type 2 diabetes mellitus with hyperglycemia: Secondary | ICD-10-CM

## 2022-07-27 MED ORDER — GLIMEPIRIDE 4 MG PO TABS: 8.0000 mg | ORAL_TABLET | Freq: Every day | ORAL | 0 refills | Status: DC

## 2022-08-18 ENCOUNTER — Other Ambulatory Visit: Payer: Self-pay

## 2022-08-18 DIAGNOSIS — I10 Essential (primary) hypertension: Secondary | ICD-10-CM

## 2022-08-18 MED ORDER — HYDROCHLOROTHIAZIDE 25 MG PO TABS: ORAL_TABLET | ORAL | 0 refills | Status: DC

## 2022-08-18 MED ORDER — LISINOPRIL 40 MG PO TABS: ORAL_TABLET | ORAL | 0 refills | Status: DC

## 2022-08-21 ENCOUNTER — Telehealth: Payer: Self-pay | Admitting: Diagnostic Neuroimaging

## 2022-08-21 NOTE — Telephone Encounter (Signed)
Called Mary Esther from [facility]. Provided the VO for the PT being requested. She didn't answer. LVM advising the VO. Advised the pt to call back if clarification was needed.

## 2022-08-21 NOTE — Telephone Encounter (Signed)
[facility] Tresa Endo) requesting verbal orders  for PT. Frequency: 1x other wk for 2 wks. 1x a wk for 7 wks.

## 2022-08-23 ENCOUNTER — Ambulatory Visit: Payer: Self-pay

## 2022-08-23 NOTE — Telephone Encounter (Signed)
  Chief Complaint: scraped toe  from fall Symptoms: scratched toe (left foot the toe next to the great toe) Frequency: yesterday Pertinent Negatives: Patient denies pain, aching, bleeding Disposition: [] ED /[] Urgent Care (no appt availability in office) / [] Appointment(In office/virtual)/ []  [facility]/ [x] Home Care/ [] Refused Recommended Disposition /[] [facility]/ []  Follow-up with PCP Additional Notes: Advised pt to get Bacitracin ointment and pt ordered from during call. Fell yesterday Standing up and legs buckle and tried to sit in W/C and sat edge and slid. Scraped toe on left toe. Reason for Disposition  Small cut (scratch) or abrasion (scrape) is also present  Answer Assessment - Initial Assessment Questions 1. MECHANISM: "How did the fall happen?"     Was standing up and knees buckled and went to sit down on whelchair and he sat close to edge and slid to floor 2. DOMESTIC VIOLENCE AND ELDER ABUSE SCREENING: "Did you fall because someone pushed you or tried to hurt you?" If Yes, ask: "Are you safe now?"     no 3. ONSET: "When did the fall happen?" (e.g., minutes, hours, or days ago)     yesterday 4. LOCATION: "What part of the body hit the ground?" (e.g., back, buttocks, head, hips, knees, hands, head, stomach)     N/a 5. INJURY: "Did you hurt (injure) yourself when you fell?" If Yes, ask: "What did you injure? Tell me more about this?" (e.g., body area; type of injury; pain severity)"     Scraped  the left toe next to the great toe 6. PAIN: "Is there any pain?" If Yes, ask: "How bad is the pain?" (e.g., Scale 1-10; or mild,  moderate, severe)   - NONE (0): No pain   - MILD (1-3): Doesn't interfere with normal activities    - MODERATE (4-7): Interferes with normal activities or awakens from sleep    - SEVERE (8-10): Excruciating pain, unable to do any normal activities      denies 7. SIZE: For cuts, bruises, or swelling, ask: "How large is it?"  (e.g., inches or centimeters)      N/a 8. PREGNANCY: "Is there any chance you are pregnant?" "When was your last menstrual period?"     N/a 9. OTHER SYMPTOMS: "Do you have any other symptoms?" (e.g., dizziness, fever, weakness; new onset or worsening).      no 10. CAUSE: "What do you think caused the fall (or falling)?" (e.g., tripped, dizzy spell)       Knees gave away.  Protocols used: Falls and [facility]

## 2022-08-27 ENCOUNTER — Other Ambulatory Visit: Payer: Self-pay | Admitting: Family

## 2022-08-27 DIAGNOSIS — E039 Hypothyroidism, unspecified: Secondary | ICD-10-CM

## 2022-10-19 ENCOUNTER — Ambulatory Visit (INDEPENDENT_AMBULATORY_CARE_PROVIDER_SITE_OTHER): Payer: Medicare Other

## 2022-10-19 VITALS — Ht 68.0 in | Wt 199.0 lb

## 2022-10-19 DIAGNOSIS — Z0000 Encounter for general adult medical examination without abnormal findings: Secondary | ICD-10-CM

## 2022-10-19 NOTE — Patient Instructions (Signed)
Randy Ballard , Thank you for taking time to come for your Medicare Wellness Visit. I appreciate your ongoing commitment to your health goals. Please review the following plan we discussed and let me know if I can assist you in the future.   These are the goals we discussed:  Goals      Patient Stated     10/19/2022, wants to get strength back in legs        This is a list of the screening recommended for you and due dates:  Health Maintenance  Topic Date Due   Zoster (Shingles) Vaccine (1 of 2) Never done   Eye exam for diabetics  05/20/2018   Yearly kidney health urinalysis for diabetes  12/23/2019   Complete foot exam   02/19/2021   Flu Shot  05/09/2022   COVID-19 Vaccine (3 - 2023-24 season) 06/09/2022   Hemoglobin A1C  08/17/2022   Colon Cancer Screening  11/19/2022   Yearly kidney function blood test for diabetes  02/15/2023   Medicare Annual Wellness Visit  10/20/2023   DTaP/Tdap/Td vaccine (2 - Td or Tdap) 03/06/2024   Hepatitis C Screening: USPSTF Recommendation to screen - Ages 71-79 yo.  Completed   HIV Screening  Completed   HPV Vaccine  Aged Out    Advanced directives: Advance directive discussed with you today.   Conditions/risks identified: none  Next appointment: Follow up in one year for your annual wellness visit   Preventive Care 40-64 Years, Male Preventive care refers to lifestyle choices and visits with your health care provider that can promote health and wellness. What does preventive care include? A yearly physical exam. This is also called an annual well check. Dental exams once or twice a year. Routine eye exams. Ask your health care provider how often you should have your eyes checked. Personal lifestyle choices, including: Daily care of your teeth and gums. Regular physical activity. Eating a healthy diet. Avoiding tobacco and drug use. Limiting alcohol use. Practicing safe sex. Taking low-dose aspirin every day starting at age 96. What  happens during an annual well check? The services and screenings done by your health care provider during your annual well check will depend on your age, overall health, lifestyle risk factors, and family history of disease. Counseling  Your health care provider may ask you questions about your: Alcohol use. Tobacco use. Drug use. Emotional well-being. Home and relationship well-being. Sexual activity. Eating habits. Work and work Statistician. Screening  You may have the following tests or measurements: Height, weight, and BMI. Blood pressure. Lipid and cholesterol levels. These may be checked every 5 years, or more frequently if you are over 61 years old. Skin check. Lung cancer screening. You may have this screening every year starting at age 32 if you have a 30-pack-year history of smoking and currently smoke or have quit within the past 15 years. Fecal occult blood test (FOBT) of the stool. You may have this test every year starting at age 86. Flexible sigmoidoscopy or colonoscopy. You may have a sigmoidoscopy every 5 years or a colonoscopy every 10 years starting at age 75. Prostate cancer screening. Recommendations will vary depending on your family history and other risks. Hepatitis C blood test. Hepatitis B blood test. Sexually transmitted disease (STD) testing. Diabetes screening. This is done by checking your blood sugar (glucose) after you have not eaten for a while (fasting). You may have this done every 1-3 years. Discuss your test results, treatment options, and if necessary,  the need for more tests with your health care provider. Vaccines  Your health care provider may recommend certain vaccines, such as: Influenza vaccine. This is recommended every year. Tetanus, diphtheria, and acellular pertussis (Tdap, Td) vaccine. You may need a Td booster every 10 years. Zoster vaccine. You may need this after age 33. Pneumococcal 13-valent conjugate (PCV13) vaccine. You may need  this if you have certain conditions and have not been vaccinated. Pneumococcal polysaccharide (PPSV23) vaccine. You may need one or two doses if you smoke cigarettes or if you have certain conditions. Talk to your health care provider about which screenings and vaccines you need and how often you need them. This information is not intended to replace advice given to you by your health care provider. Make sure you discuss any questions you have with your health care provider. Document Released: 10/22/2015 Document Revised: 06/14/2016 Document Reviewed: 07/27/2015 Elsevier Interactive Patient Education  2017 [employer] Prevention in the Home Falls can cause injuries. They can happen to people of all ages. There are many things you can do to make your home safe and to help prevent falls. What can I do on the outside of my home? Regularly fix the edges of walkways and driveways and fix any cracks. Remove anything that might make you trip as you walk through a door, such as a raised step or threshold. Trim any bushes or trees on the path to your home. Use bright outdoor lighting. Clear any walking paths of anything that might make someone trip, such as rocks or tools. Regularly check to see if handrails are loose or broken. Make sure that both sides of any steps have handrails. Any raised decks and porches should have guardrails on the edges. Have any leaves, snow, or ice cleared regularly. Use sand or salt on walking paths during winter. Clean up any spills in your garage right away. This includes oil or grease spills. What can I do in the bathroom? Use night lights. Install grab bars by the toilet and in the tub and shower. Do not use towel bars as grab bars. Use non-skid mats or decals in the tub or shower. If you need to sit down in the shower, use a plastic, non-slip stool. Keep the floor dry. Clean up any water that spills on the floor as soon as it happens. Remove soap buildup  in the tub or shower regularly. Attach bath mats securely with double-sided non-slip rug tape. Do not have throw rugs and other things on the floor that can make you trip. What can I do in the bedroom? Use night lights. Make sure that you have a light by your bed that is easy to reach. Do not use any sheets or blankets that are too big for your bed. They should not hang down onto the floor. Have a firm chair that has side arms. You can use this for support while you get dressed. Do not have throw rugs and other things on the floor that can make you trip. What can I do in the kitchen? Clean up any spills right away. Avoid walking on wet floors. Keep items that you use a lot in easy-to-reach places. If you need to reach something above you, use a strong step stool that has a grab bar. Keep electrical cords out of the way. Do not use floor polish or wax that makes floors slippery. If you must use wax, use non-skid floor wax. Do not have throw rugs and other  things on the floor that can make you trip. What can I do with my stairs? Do not leave any items on the stairs. Make sure that there are handrails on both sides of the stairs and use them. Fix handrails that are broken or loose. Make sure that handrails are as long as the stairways. Check any carpeting to make sure that it is firmly attached to the stairs. Fix any carpet that is loose or worn. Avoid having throw rugs at the top or bottom of the stairs. If you do have throw rugs, attach them to the floor with carpet tape. Make sure that you have a light switch at the top of the stairs and the bottom of the stairs. If you do not have them, ask someone to add them for you. What else can I do to help prevent falls? Wear shoes that: Do not have high heels. Have rubber bottoms. Are comfortable and fit you well. Are closed at the toe. Do not wear sandals. If you use a stepladder: Make sure that it is fully opened. Do not climb a closed  stepladder. Make sure that both sides of the stepladder are locked into place. Ask someone to hold it for you, if possible. Clearly mark and make sure that you can see: Any grab bars or handrails. First and last steps. Where the edge of each step is. Use tools that help you move around (mobility aids) if they are needed. These include: Canes. Walkers. Scooters. Crutches. Turn on the lights when you go into a dark area. Replace any light bulbs as soon as they burn out. Set up your furniture so you have a clear path. Avoid moving your furniture around. If any of your floors are uneven, fix them. If there are any pets around you, be aware of where they are. Review your medicines with your doctor. Some medicines can make you feel dizzy. This can increase your chance of falling. Ask your doctor what other things that you can do to help prevent falls. This information is not intended to replace advice given to you by your health care provider. Make sure you discuss any questions you have with your health care provider. Document Released: 07/22/2009 Document Revised: 03/02/2016 Document Reviewed: 10/30/2014 Elsevier Interactive Patient Education  2017 [employer].

## 2022-10-19 NOTE — Progress Notes (Addendum)
I connected with Randy Ballard today by telephone and verified that I am speaking with the correct person using two identifiers. Location patient: home Location provider: work Persons participating in the virtual visit: [place], Dearinger LPN.   I discussed the limitations, risks, security and privacy concerns of performing an evaluation and management service by telephone and the availability of in person appointments. I also discussed with the patient that there may be a patient responsible charge related to this service. The patient expressed understanding and verbally consented to this telephonic visit.    Interactive audio and video telecommunications were attempted between this provider and patient, however failed, due to patient having technical difficulties OR patient did not have access to video capability.  We continued and completed visit with audio only.     Vital signs may be patient reported or missing.  Subjective:   Randy Ballard is a 62 y.o. male who presents for an Initial Medicare Annual Wellness Visit.  Review of Systems     Cardiac Risk Factors include: advanced age (>41men, >17 women);diabetes mellitus;hypertension;male gender;obesity (BMI >30kg/m2)     Objective:    Today's Vitals   10/19/22 1141  Weight: 199 lb (90.3 kg)  Height: 5\' 8"  (1.727 m)   Body mass index is 30.26 kg/m.     10/19/2022   11:48 AM 09/15/2014   10:26 AM 04/16/2013    3:32 PM  Advanced Directives  Does Patient Have a Medical Advance Directive? No No Patient does not have advance directive;Patient would not like information  Would patient like information on creating a medical advance directive?  Yes - Educational materials given     Current Medications (verified) Outpatient Encounter Medications as of 10/19/2022  Medication Sig   Cholecalciferol (VITAMIN D3) 5000 units CAPS Take 1 capsule (5,000 Units total) by mouth daily.   glimepiride (AMARYL) 4 MG tablet Take 2  tablets (8 mg total) by mouth daily with breakfast.   hydrochlorothiazide (HYDRODIURIL) 25 MG tablet TAKE 1 TABLET(25 MG) BY MOUTH DAILY   levothyroxine (SYNTHROID) 112 MCG tablet Take 112 mcg by mouth daily.   lisinopril (ZESTRIL) 40 MG tablet TAKE 1 TABLET(40 MG) BY MOUTH DAILY   metFORMIN (GLUCOPHAGE) 500 MG tablet TAKE 2 TABLETS(1000 MG) BY MOUTH TWICE DAILY WITH A MEAL   metoprolol succinate (TOPROL-XL) 100 MG 24 hr tablet TAKE 1 TABLET BY MOUTH EVERY DAY IMMEDIATELY FOLLOWING A MEAL   rosuvastatin (CRESTOR) 5 MG tablet TAKE 1 TABLET(5 MG) BY MOUTH AT BEDTIME   empagliflozin (JARDIANCE) 10 MG TABS tablet 1 tablet   levothyroxine (SYNTHROID) 125 MCG tablet TAKE 1 TABLET BY MOUTH DAILY BEFORE BREAKFAST. (Patient not taking: Reported on 10/19/2022)   No facility-administered encounter medications on file as of 10/19/2022.    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   COLONOSCOPY W/ POLYPECTOMY  Feb 2014   5 polyps   TOOTH EXTRACTION  10/2015   x 4 teeth   Family History  Adopted: Yes  Problem Relation Age of Onset  Ataxia Mother 31       SCA3   Ataxia Maternal Grandmother        SCA3   Ataxia Maternal Aunt        SCA3   Social History   Socioeconomic History   Marital status: Married    Spouse name: Randy Ballard   Number of children: 0   Years of education: HS   Highest education level: Not on file  Occupational History   Occupation: service rep   Occupation: retired    Associate Professor: Water quality scientist  Tobacco Use   Smoking status:  Every Day    Packs/day: 0.50    Years: 32.00    Total pack years: 16.00    Types: Cigarettes    Passive exposure: Current   Smokeless tobacco: Never   Tobacco comments:    10/26/15 trying to quit, 12/21/21 smoke on weekends only  Vaping Use   Vaping Use: Never used  Substance and Sexual Activity   Alcohol use: No    Alcohol/week: 0.0 standard drinks of alcohol   Drug use: No   Sexual activity: Yes    Comment: 1 partner in last 12 months  Other Topics Concern   Not on file  Social History Narrative   Marital status:  Married x 20 years; happily.      Children: none      Lives: with wife.      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      Alcohol: None      Drugs: none      Exercise: treadmill sporadic.   Social Determinants of Health   Financial Resource Strain: Low Risk  (10/19/2022)   Overall Financial Resource Strain (CARDIA)    Difficulty of Paying Living Expenses: Not hard at all  Food Insecurity: No Food Insecurity (10/19/2022)   Hunger Vital Sign    Worried About Running Out of Food in the Last Year: Never true    Ran Out of Food in the Last Year: Never true  Transportation Needs: No Transportation Needs (10/19/2022)   PRAPARE - Administrator, Civil Service (Medical): No    Lack of Transportation (Non-Medical): No  Physical Activity: Sufficiently Active (10/19/2022)   Exercise Vital Sign    Days of Exercise per Week: 5 days    Minutes of Exercise per Session: 60 min  Stress: No Stress Concern Present (10/19/2022)   [employer] of Occupational Health - Occupational Stress Questionnaire    Feeling of Stress : Not at all  Social Connections: Not on file    Tobacco Counseling Ready to quit: Yes Counseling given: Not Answered Tobacco comments: 10/26/15 trying to quit, 12/21/21 smoke on weekends only   Clinical Intake:  Pre-visit preparation completed: Yes  Pain : No/denies  pain     Nutritional Status: BMI > 30  Obese Nutritional Risks: None Diabetes: Yes  How often do you need to have someone help you when you read instructions, pamphlets, or other written materials from your doctor or pharmacy?: 1 - Never  Diabetic? Yes Nutrition Risk Assessment:  Has the patient had any N/V/D within the last 2 months?  No  Does the patient have any non-healing wounds?  No  Has the patient had any unintentional weight loss or weight gain?  No   Diabetes:  Is the patient diabetic?  Yes  If diabetic, was a CBG obtained today?  No  Did  the patient bring in their glucometer from home?  No  How often do you monitor your CBG's? Once monthly.   Financial Strains and Diabetes Management:  Are you having any financial strains with the device, your supplies or your medication? No .  Does the patient want to be seen by Chronic Care Management for management of their diabetes?  No  Would the patient like to be referred to a Nutritionist or for Diabetic Management?  No   Diabetic Exams:  Diabetic Eye Exam: Overdue for diabetic eye exam. Pt has been advised about the importance in completing this exam. Patient advised to call and schedule an eye exam. Diabetic Foot Exam: Overdue, Pt has been advised about the importance in completing this exam. Pt is scheduled for diabetic foot exam on next appointment.   Interpreter Needed?: No  Information entered by :: NAllen LPN   Activities of Daily Living    10/19/2022   11:51 AM  In your present state of health, do you have any difficulty performing the following activities:  Hearing? 0  Vision? 0  Difficulty concentrating or making decisions? 0  Walking or climbing stairs? 1  Dressing or bathing? 1  Doing errands, shopping? 0  Preparing Food and eating ? Y  Using the Toilet? N  In the past six months, have you accidently leaked urine? N  Do you have problems with loss of bowel control? N  Managing your Medications? N   Managing your Finances? N  Housekeeping or managing your Housekeeping? Y    Patient Care Team: [name], NP as PCP - General (Nurse Practitioner) Talmage Coin, MD as Consulting Physician (Endocrinology) Suanne Marker, MD as Consulting Physician (Neurology) Helane Gunther, DPM as Consulting Physician (Podiatry)  Indicate any recent Medical Services you may have received from other than [facility] providers in the past year (date may be approximate).     Assessment:   This is a routine wellness examination for [place].  Hearing/Vision screen Vision Screening - Comments:: No regular eye exams,  Dietary issues and exercise activities discussed: Current Exercise Habits: Home exercise routine, Type of exercise: strength training/weights;Other - see comments (PT/OT), Time (Minutes): 60, Frequency (Times/Week): 5, Weekly Exercise (Minutes/Week): 300   Goals Addressed             This Visit's Progress    Patient Stated       10/19/2022, wants to get strength back in legs       Depression Screen    10/19/2022   11:50 AM 02/14/2022    2:12 PM 09/12/2021   10:45 AM 06/15/2021    4:04 PM 03/16/2021    2:29 PM 12/10/2020    9:18 AM 10/18/2020    2:17 PM  PHQ 2/9 Scores  PHQ - 2 Score 0 0 0 0 2 0 0  PHQ- 9 Score     4      Fall Risk    10/19/2022   11:50 AM 09/12/2021   10:47 AM 03/16/2021    2:28 PM 10/18/2020    2:17 PM 02/20/2020    2:15 PM  Fall Risk   Falls in the past year? 0 1 0 0 1  Number falls in past yr: 0 1 0 0 1  Injury with Fall? 0 1 0 0 1  Risk for fall due to : Impaired balance/gait;Medication side effect Impaired balance/gait;History of fall(s) No Fall Risks No Fall Risks   Follow up Falls prevention discussed;Falls evaluation completed;Education provided Falls  evaluation completed Falls evaluation completed Falls evaluation completed Falls evaluation completed    FALL RISK PREVENTION PERTAINING TO THE HOME:  Any stairs in or around the home? No  If so,  are there any without handrails? N/a Home free of loose throw rugs in walkways, pet beds, electrical cords, etc? Yes  Adequate lighting in your home to reduce risk of falls? Yes   ASSISTIVE DEVICES UTILIZED TO PREVENT FALLS:  Life alert? No  Use of a cane, walker or w/c? Yes  Grab bars in the bathroom? Yes  Shower chair or bench in shower? Yes  Elevated toilet seat or a handicapped toilet? Yes   TIMED UP AND GO:  Was the test performed? No .      Cognitive Function:        10/19/2022   11:52 AM  6CIT Screen  What Year? 0 points  What month? 0 points  What time? 0 points  Count back from 20 0 points  Months in reverse 0 points  Repeat phrase 0 points  Total Score 0 points    Immunizations Immunization History  Administered Date(s) Administered   Influenza Inj Mdck Quad Pf 07/09/2019   Influenza Split 09/23/2013   Influenza,inj,Quad PF,6+ Mos 07/03/2014, 06/10/2015, 06/20/2017, 07/19/2018, 07/09/2019   Influenza-Unspecified 09/02/2016, 07/21/2020   PFIZER(Purple Top)SARS-COV-2 Vaccination 01/11/2020, 02/08/2020   Pneumococcal Polysaccharide-23 10/27/2012, 03/19/2018   Tdap 03/06/2014    TDAP status: Up to date  Flu Vaccine status: Due, Education has been provided regarding the importance of this vaccine. Advised may receive this vaccine at local pharmacy or Health Dept. Aware to provide a copy of the vaccination record if obtained from local pharmacy or Health Dept. Verbalized acceptance and understanding.  Pneumococcal vaccine status: Up to date  Covid-19 vaccine status: Completed vaccines  Qualifies for Shingles Vaccine? Yes   Zostavax completed No   Shingrix Completed?: No.    Education has been provided regarding the importance of this vaccine. Patient has been advised to call insurance company to determine out of pocket expense if they have not yet received this vaccine. Advised may also receive vaccine at local pharmacy or Health Dept. Verbalized acceptance  and understanding.  Screening Tests Health Maintenance  Topic Date Due   Medicare Annual Wellness (AWV)  Never done   Zoster Vaccines- Shingrix (1 of 2) Never done   OPHTHALMOLOGY EXAM  05/20/2018   Diabetic kidney evaluation - Urine ACR  12/23/2019   FOOT EXAM  02/19/2021   INFLUENZA VACCINE  05/09/2022   COVID-19 Vaccine (3 - 2023-24 season) 06/09/2022   HEMOGLOBIN A1C  08/17/2022   COLONOSCOPY (Pts 45-36yrs Insurance coverage will need to be confirmed)  11/19/2022   Diabetic kidney evaluation - eGFR measurement  02/15/2023   DTaP/Tdap/Td (2 - Td or Tdap) 03/06/2024   Hepatitis C Screening  Completed   HIV Screening  Completed   HPV VACCINES  Aged Out    Health Maintenance  Health Maintenance Due  Topic Date Due   Medicare Annual Wellness (AWV)  Never done   Zoster Vaccines- Shingrix (1 of 2) Never done   OPHTHALMOLOGY EXAM  05/20/2018   Diabetic kidney evaluation - Urine ACR  12/23/2019   FOOT EXAM  02/19/2021   INFLUENZA VACCINE  05/09/2022   COVID-19 Vaccine (3 - 2023-24 season) 06/09/2022   HEMOGLOBIN A1C  08/17/2022    Colorectal cancer screening: Type of screening: Colonoscopy. Completed 11/19/2012. Repeat every 10 years  Lung Cancer Screening: (Low Dose CT Chest recommended if Age  55-80 years, 30 pack-year currently smoking OR have quit w/in 15years.) does not qualify.   Lung Cancer Screening Referral: no  Additional Screening:  Hepatitis C Screening: does qualify; Completed 06/10/2015  Vision Screening: Recommended annual ophthalmology exams for early detection of glaucoma and other disorders of the eye. Is the patient up to date with their annual eye exam?  No  Who is the provider or what is the name of the office in which the patient attends annual eye exams? Can't remember name If pt is not established with a provider, would they like to be referred to a provider to establish care? No .   Dental Screening: Recommended annual dental exams for proper oral  hygiene  Community Resource Referral / Chronic Care Management: CRR required this visit?  No   CCM required this visit?  No      Plan:     I have personally reviewed and noted the following in the patient's chart:   Medical and social history Use of alcohol, tobacco or illicit drugs  Current medications and supplements including opioid prescriptions. Patient is not currently taking opioid prescriptions. Functional ability and status Nutritional status Physical activity Advanced directives List of other physicians Hospitalizations, surgeries, and ER visits in previous 12 months Vitals Screenings to include cognitive, depression, and falls Referrals and appointments  In addition, I have reviewed and discussed with patient certain preventive protocols, quality metrics, and best practice recommendations. A written personalized care plan for preventive services as well as general preventive health recommendations were provided to patient.     Kellie Simmering, LPN   [id number]   Nurse Notes: none  Due to this being a virtual visit, the after visit summary with patients personalized plan was offered to patient via mail or my-chart.  to pick up at office at next visit

## 2022-10-26 ENCOUNTER — Other Ambulatory Visit: Payer: Self-pay | Admitting: Family

## 2022-10-26 DIAGNOSIS — E1165 Type 2 diabetes mellitus with hyperglycemia: Secondary | ICD-10-CM

## 2022-10-26 NOTE — Telephone Encounter (Signed)
Requested medication (s) are due for refill today: Due 10/27/22  Requested medication (s) are on the active medication list: yes    Last refill: 07/27/22  #180  0 refills  Future visit scheduled no  Notes to clinic:Failed due to labs, please review. Thank you.  Requested Prescriptions  Pending Prescriptions Disp Refills   glimepiride (AMARYL) 4 MG tablet [Pharmacy Med Name: GLIMEPIRIDE 4MG  TABLETS] 180 tablet 0    Sig: TAKE 2 TABLETS(8 MG) BY MOUTH DAILY WITH BREAKFAST     Endocrinology:  Diabetes - Sulfonylureas Failed - 10/26/2022  7:08 AM      Failed - HBA1C is between 0 and 7.9 and within 180 days    Hemoglobin A1C  Date Value Ref Range Status  02/14/2022 8.4 (A) 4.0 - 5.6 % Final   Hgb A1c MFr Bld  Date Value Ref Range Status  03/16/2021 8.7 (H) 4.8 - 5.6 % Final    Comment:             Prediabetes: 5.7 - 6.4          Diabetes: >6.4          Glycemic control for adults with diabetes: <7.0          Failed - Valid encounter within last 6 months    Recent Outpatient Visits           5 months ago Encounter for power mobility device assessment   Primary Care at [facility], Amy J, NP   7 months ago Encounter for power mobility device assessment   Primary Care at [facility], Amy J, NP   8 months ago Essential (primary) hypertension   Primary Care at [facility], Amy J, NP   1 year ago SCA-3 (spinocerebellar ataxia type 3) [facility])   Primary Care at [facility], Amy J, NP   1 year ago SCA-3 (spinocerebellar ataxia type 3) [facility])   Primary Care at [facility], Kriste Basque, NP              Passed - Cr in normal range and within 360 days    Creat  Date Value Ref Range Status  05/25/2016 0.99 0.70 - 1.33 mg/dL Final    Comment:      For patients > or = 63 years of age: The upper reference limit for Creatinine is approximately 13% higher for people identified as African-American.      Creatinine, Ser  Date  Value Ref Range Status  02/14/2022 0.87 0.76 - 1.27 mg/dL Final   Creatinine, Urine  Date Value Ref Range Status  05/25/2016 278 20 - 370 mg/dL Final

## 2022-10-30 ENCOUNTER — Other Ambulatory Visit: Payer: Self-pay | Admitting: Family

## 2022-10-30 DIAGNOSIS — I10 Essential (primary) hypertension: Secondary | ICD-10-CM

## 2022-10-31 MED ORDER — METOPROLOL SUCCINATE ER 100 MG PO TB24: ORAL_TABLET | ORAL | 0 refills | Status: DC

## 2022-11-15 ENCOUNTER — Other Ambulatory Visit: Payer: Self-pay | Admitting: Family

## 2022-11-15 DIAGNOSIS — I10 Essential (primary) hypertension: Secondary | ICD-10-CM

## 2022-11-15 NOTE — Telephone Encounter (Signed)
Requested Prescriptions  Pending Prescriptions Disp Refills   lisinopril (ZESTRIL) 40 MG tablet [Pharmacy Med Name: LISINOPRIL 40MG  TABLETS] 30 tablet 0    Sig: TAKE ONE TABLET BY MOUTH EVERY DAY     Cardiovascular:  ACE Inhibitors Failed - 11/15/2022  7:06 AM      Failed - Cr in normal range and within 180 days    Creat  Date Value Ref Range Status  05/25/2016 0.99 0.70 - 1.33 mg/dL Final    Comment:      For patients > or = 63 years of age: The upper reference limit for Creatinine is approximately 13% higher for people identified as African-American.      Creatinine, Ser  Date Value Ref Range Status  02/14/2022 0.87 0.76 - 1.27 mg/dL Final   Creatinine, Urine  Date Value Ref Range Status  05/25/2016 278 20 - 370 mg/dL Final         Failed - K in normal range and within 180 days    Potassium  Date Value Ref Range Status  02/14/2022 4.6 3.5 - 5.2 mmol/L Final         Failed - Valid encounter within last 6 months    Recent Outpatient Visits           6 months ago Encounter for power mobility device assessment   [place] [facility] at [facility], Amy J, NP   8 months ago Encounter for power mobility device assessment   [place] [facility] at [facility], Amy J, NP   9 months ago Essential (primary) hypertension   [place] [facility] at [facility], [place], NP   1 year ago SCA-3 (spinocerebellar ataxia type 3) ([place])   [place] [facility] at [facility], [place], NP   1 year ago SCA-3 (spinocerebellar ataxia type 3) [facility])   [place] [facility] at [facility], Kriste Basque, NP       Future Appointments             In 1 week Camillia Herter, NP [place] at [facility] - Patient is not pregnant      Passed - Last BP in normal range    BP Readings from Last 1 Encounters:  03/15/22 130/82          hydrochlorothiazide (HYDRODIURIL) 25 MG tablet  [Pharmacy Med Name: HYDROCHLOROTHIAZIDE 25MG  TABLETS] 30 tablet 0    Sig: TAKE ONE TABLET BY MOUTH EVERY DAY     Cardiovascular: Diuretics - Thiazide Failed - 11/15/2022  7:06 AM      Failed - Cr in normal range and within 180 days    Creat  Date Value Ref Range Status  05/25/2016 0.99 0.70 - 1.33 mg/dL Final    Comment:      For patients > or = 63 years of age: The upper reference limit for Creatinine is approximately 13% higher for people identified as African-American.      Creatinine, Ser  Date Value Ref Range Status  02/14/2022 0.87 0.76 - 1.27 mg/dL Final   Creatinine, Urine  Date Value Ref Range Status  05/25/2016 278 20 - 370 mg/dL Final         Failed - K in normal range and within 180 days    Potassium  Date Value Ref Range Status  02/14/2022 4.6 3.5 - 5.2 mmol/L  Final         Failed - Na in normal range and within 180 days    Sodium  Date Value Ref Range Status  02/14/2022 141 134 - 144 mmol/L Final         Failed - Valid encounter within last 6 months    Recent Outpatient Visits           6 months ago Encounter for power mobility device assessment   [place] [facility] at [facility], Amy J, NP   8 months ago Encounter for power mobility device assessment   [place] [facility] at [facility], [place], NP   9 months ago Essential (primary) hypertension   [place] [facility] at [facility], [place], NP   1 year ago SCA-3 (spinocerebellar ataxia type 3) ([place])   [place] [facility] at [facility], [place], NP   1 year ago SCA-3 (spinocerebellar ataxia type 3) [facility])   [place] [facility] at [facility], Kriste Basque, NP       Future Appointments             In 1 week Camillia Herter, NP [facility] at [place] BP in normal range    BP Readings from Last 1 Encounters:  03/15/22 130/82

## 2022-11-16 ENCOUNTER — Other Ambulatory Visit: Payer: Self-pay

## 2022-11-16 DIAGNOSIS — I10 Essential (primary) hypertension: Secondary | ICD-10-CM

## 2022-11-16 MED ORDER — LISINOPRIL 40 MG PO TABS: ORAL_TABLET | ORAL | 0 refills | Status: DC

## 2022-11-16 MED ORDER — HYDROCHLOROTHIAZIDE 25 MG PO TABS: ORAL_TABLET | ORAL | 0 refills | Status: DC

## 2022-11-24 NOTE — Progress Notes (Unsigned)
Patient ID: Randy Ballard, male    DOB: 1961/06/22  MRN: [id number]  CC: Chronic Care Management   Subjective: Randy Ballard is a 62 y.o. male who presents for chronic care management.   His concerns today include:  HTN - Lisinopril, HCTZ, Metoprolol HLD - Rosuvastatin  Endo - thyroid Endo - DM  Patient Active Problem List   Diagnosis Date Noted   Colon cancer screening 03/16/2021   Anemia 03/16/2021   Tobacco abuse 11/06/2014   SCA-3 (spinocerebellar ataxia type 3) ([place]) 12/17/2013   History of pancreatitis 04/16/2013   HTN (hypertension) 12/23/2011   Type II diabetes mellitus with ophthalmic manifestations ([place]) 12/23/2011   Hypothyroid 12/23/2011   Dyslipidemia 12/23/2011     Current Outpatient Medications on File Prior to Visit  Medication Sig Dispense Refill   Cholecalciferol (VITAMIN D3) 5000 units CAPS Take 1 capsule (5,000 Units total) by mouth daily.     empagliflozin (JARDIANCE) 10 MG TABS tablet 1 tablet     glimepiride (AMARYL) 4 MG tablet TAKE 2 TABLETS(8 MG) BY MOUTH DAILY WITH BREAKFAST 180 tablet 0   hydrochlorothiazide (HYDRODIURIL) 25 MG tablet TAKE ONE TABLET BY MOUTH EVERY DAY 90 tablet 0   levothyroxine (SYNTHROID) 112 MCG tablet Take 112 mcg by mouth daily.     levothyroxine (SYNTHROID) 125 MCG tablet TAKE 1 TABLET BY MOUTH DAILY BEFORE BREAKFAST. (Patient not taking: Reported on 10/19/2022) 90 tablet 0   lisinopril (ZESTRIL) 40 MG tablet TAKE ONE TABLET BY MOUTH EVERY DAY 90 tablet 0   metFORMIN (GLUCOPHAGE) 500 MG tablet TAKE 2 TABLETS(1000 MG) BY MOUTH TWICE DAILY WITH A MEAL 120 tablet 3   metoprolol succinate (TOPROL-XL) 100 MG 24 hr tablet TAKE 1 TABLET BY MOUTH EVERY DAY IMMEDIATELY FOLLOWING A MEAL 120 tablet 0   rosuvastatin (CRESTOR) 5 MG tablet TAKE 1 TABLET(5 MG) BY MOUTH AT BEDTIME 180 tablet 0   No current facility-administered medications on file prior to visit.    Allergies  Allergen Reactions   Januvia [Sitagliptin] Nausea And  Vomiting    pancreatitis    Social History   Socioeconomic History   Marital status: Married    Spouse name: Helene Kelp   Number of children: 0   Years of education: HS   Highest education level: Not on file  Occupational History   Occupation: service rep   Occupation: retired    Fish farm manager: Musician  Tobacco Use   Smoking status: Every Day    Packs/day: 0.50    Years: 32.00    Total pack years: 16.00    Types: Cigarettes    Passive exposure: Current   Smokeless tobacco: Never   Tobacco comments:    10/26/15 trying to quit, 12/21/21 smoke on weekends only  Vaping Use   Vaping Use: Never used  Substance and Sexual Activity   Alcohol use: No    Alcohol/week: 0.0 standard drinks of alcohol   Drug use: No   Sexual activity: Yes    Comment: 1 partner in last 12 months  Other Topics Concern   Not on file  Social History Narrative   Marital status:  Married x 20 years; happily.      Children: none      Lives: with wife.      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      Alcohol: None      Drugs: none  Exercise: treadmill sporadic.   Social Determinants of Health   Financial Resource Strain: Low Risk  (10/19/2022)   Overall Financial Resource Strain (CARDIA)    Difficulty of Paying Living Expenses: Not hard at all  Food Insecurity: No Food Insecurity (10/19/2022)   Hunger Vital Sign    Worried About Running Out of Food in the Last Year: Never true    Ran Out of Food in the Last Year: Never true  Transportation Needs: No Transportation Needs (10/19/2022)   PRAPARE - Hydrologist (Medical): No    Lack of Transportation (Non-Medical): No  Physical Activity: Sufficiently Active (10/19/2022)   Exercise Vital Sign    Days of Exercise per Week: 5 days    Minutes of Exercise per Session: 60 min  Stress: No Stress Concern Present (10/19/2022)   [place]    Feeling of Stress : Not at all  Social Connections: Not on file  Intimate Partner Violence: Not on file    Family History  Adopted: Yes  Problem Relation Age of Onset   Ataxia Mother 67       SCA3   Ataxia Maternal Grandmother        SCA3   Ataxia Maternal Aunt        SCA3    Past Surgical History:  Procedure Laterality Date   BIOPSY THYROID  02/06/2009   cold thyroid nodule; negative/benign.   COLONOSCOPY W/ POLYPECTOMY  Feb 2014   5 polyps   TOOTH EXTRACTION  10/2015   x 4 teeth    ROS: Review of Systems Negative except as stated above  PHYSICAL EXAM: There were no vitals taken for this visit.  Physical Exam  {male adult master:310786} {male adult master:310785}     Latest Ref Rng & Units 02/14/2022    3:48 PM 03/16/2021    4:31 PM 02/20/2020    3:10 PM  CMP  Glucose 70 - 99 mg/dL 233  211  102   BUN 8 - 27 mg/dL 11  13  14   $ Creatinine 0.76 - 1.27 mg/dL 0.87  0.92  0.84   Sodium 134 - 144 mmol/L 141  137  140   Potassium 3.5 - 5.2 mmol/L 4.6  4.4  4.0   Chloride 96 - 106 mmol/L 99  96  100   CO2 20 - 29 mmol/L 26  25  26   $ Calcium 8.6 - 10.2 mg/dL 9.3  9.5  10.0   Total Protein 6.0 - 8.5 g/dL  7.6  7.3   Total Bilirubin 0.0 - 1.2 mg/dL  0.4  0.4   Alkaline Phos 44 - 121 IU/L  86  69   AST 0 - 40 IU/L  27  24   ALT 0 - 44 IU/L  33  26    Lipid Panel     Component Value Date/Time   CHOL 191 02/14/2022 1548   TRIG 202 (H) 02/14/2022 1548   HDL 35 (L) 02/14/2022 1548   CHOLHDL 5.5 (H) 02/14/2022 1548   CHOLHDL 3.9 05/25/2016 0827   VLDL 26 05/25/2016 0827   LDLCALC 120 (H) 02/14/2022 1548    CBC    Component Value Date/Time   WBC 9.4 03/16/2021 1631   WBC 10.1 05/25/2016 0919   RBC 4.08 (L) 03/16/2021 1631   RBC 4.40 05/25/2016 0919   HGB 12.4 (L) 03/16/2021 1631   HCT 37.9 03/16/2021 1631   PLT 278  03/16/2021 1631   MCV 93 03/16/2021 1631   MCH 30.4 03/16/2021 1631   MCH 31.1 05/25/2016 0919    MCHC 32.7 03/16/2021 1631   MCHC 33.7 05/25/2016 0919   RDW 11.1 (L) 03/16/2021 1631   LYMPHSABS 2.0 03/06/2014 0900   MONOABS 0.7 03/06/2014 0900   EOSABS 0.2 03/06/2014 0900   BASOSABS 0.0 03/06/2014 0900    ASSESSMENT AND PLAN:  There are no diagnoses linked to this encounter.   Patient was given the opportunity to ask questions.  Patient verbalized understanding of the plan and was able to repeat key elements of the plan. Patient was given clear instructions to go to Emergency Department or return to medical center if symptoms don't improve, worsen, or new problems develop.The patient verbalized understanding.   No orders of the defined types were placed in this encounter.    Requested Prescriptions    No prescriptions requested or ordered in this encounter    No follow-ups on file.  Camillia Herter, NP

## 2022-11-28 ENCOUNTER — Encounter: Payer: Self-pay | Admitting: Family

## 2022-11-28 ENCOUNTER — Ambulatory Visit (INDEPENDENT_AMBULATORY_CARE_PROVIDER_SITE_OTHER): Payer: Medicare Other | Admitting: Family

## 2022-11-28 VITALS — BP 141/68 | HR 61 | Temp 98.3°F | Resp 16 | Ht 67.99 in

## 2022-11-28 DIAGNOSIS — Z23 Encounter for immunization: Secondary | ICD-10-CM

## 2022-11-28 DIAGNOSIS — E785 Hyperlipidemia, unspecified: Secondary | ICD-10-CM | POA: Diagnosis not present

## 2022-11-28 DIAGNOSIS — I10 Essential (primary) hypertension: Secondary | ICD-10-CM | POA: Diagnosis not present

## 2022-11-28 MED ORDER — ROSUVASTATIN CALCIUM 5 MG PO TABS: ORAL_TABLET | ORAL | 2 refills | Status: DC

## 2022-11-28 MED ORDER — ROSUVASTATIN CALCIUM 10 MG PO TABS: 10.0000 mg | ORAL_TABLET | Freq: Every day | ORAL | 2 refills | Status: DC

## 2022-11-28 NOTE — Progress Notes (Signed)
.  Pt presents for chronic care management   

## 2022-11-29 LAB — BASIC METABOLIC PANEL
BUN/Creatinine Ratio: 9 — ABNORMAL LOW (ref 10–24)
BUN: 8 mg/dL (ref 8–27)
CO2: 24 mmol/L (ref 20–29)
Calcium: 9.7 mg/dL (ref 8.6–10.2)
Chloride: 97 mmol/L (ref 96–106)
Creatinine, Ser: 0.88 mg/dL (ref 0.76–1.27)
Glucose: 142 mg/dL — ABNORMAL HIGH (ref 70–99)
Potassium: 4.2 mmol/L (ref 3.5–5.2)
Sodium: 139 mmol/L (ref 134–144)
eGFR: 97 mL/min/{1.73_m2} (ref >59)

## 2023-01-29 ENCOUNTER — Ambulatory Visit: Payer: Medicare Other | Admitting: Family

## 2023-02-09 ENCOUNTER — Other Ambulatory Visit: Payer: Self-pay | Admitting: Family

## 2023-02-09 DIAGNOSIS — E785 Hyperlipidemia, unspecified: Secondary | ICD-10-CM

## 2023-02-09 DIAGNOSIS — E1165 Type 2 diabetes mellitus with hyperglycemia: Secondary | ICD-10-CM

## 2023-02-09 DIAGNOSIS — I10 Essential (primary) hypertension: Secondary | ICD-10-CM

## 2023-02-09 NOTE — Telephone Encounter (Signed)
Rx dose was changed on 11/28/22 by provider.  Requested Prescriptions  Pending Prescriptions Disp Refills   rosuvastatin (CRESTOR) 5 MG tablet [Pharmacy Med Name: ROSUVASTATIN 5MG  TABLETS] 30 tablet 2    Sig: TAKE 1 TABLET(5 MG) BY MOUTH AT BEDTIME     Cardiovascular:  Antilipid - Statins 2 Failed - 02/09/2023  3:23 PM      Failed - Lipid Panel in normal range within the last 12 months    Cholesterol, Total  Date Value Ref Range Status  02/14/2022 191 100 - 199 mg/dL Final   LDL Chol Calc (NIH)  Date Value Ref Range Status  02/14/2022 120 (H) 0 - 99 mg/dL Final   HDL  Date Value Ref Range Status  02/14/2022 35 (L) >39 mg/dL Final   Triglycerides  Date Value Ref Range Status  02/14/2022 202 (H) 0 - 149 mg/dL Final         Passed - Cr in normal range and within 360 days    Creat  Date Value Ref Range Status  05/25/2016 0.99 0.70 - 1.33 mg/dL Final    Comment:      For patients > or = 63 years of age: The upper reference limit for Creatinine is approximately 13% higher for people identified as African-American.      Creatinine, Ser  Date Value Ref Range Status  11/28/2022 0.88 0.76 - 1.27 mg/dL Final   Creatinine, Urine  Date Value Ref Range Status  05/25/2016 278 20 - 370 mg/dL Final         Passed - Patient is not pregnant      Passed - Valid encounter within last 12 months    Recent Outpatient Visits           2 months ago Primary hypertension   [place] [facility] at [facility], Amy J, NP   8 months ago Encounter for power mobility device assessment   [place] [facility] at [facility], Amy J, NP   11 months ago Encounter for power mobility device assessment   [place] [facility] at [facility], Amy J, NP   12 months ago Essential (primary) hypertension   [place] [facility] at [facility], [place], NP   1 year ago SCA-3 (spinocerebellar ataxia type 3) [facility])   [place] [facility] at  [facility], [place], NP       Future Appointments             In 2 weeks [name], NP [facility] at [facility]

## 2023-02-09 NOTE — Telephone Encounter (Signed)
Complete

## 2023-02-21 NOTE — Progress Notes (Signed)
Patient ID: [name], male    DOB: 03-02-1961  MRN: [id number]  CC: Chronic Care Management   Subjective: Randy Ballard is a 62 y.o. male who presents for chronic care management.   His concerns today include:  - Doing well on blood pressure medications, no issues/concerns. The patient did not complain of red flag symptoms such as but not limited to chest pain, shortness of breath, worst headache of life, nausea/vomiting.  - Doing well on cholesterol medication, no issues/concerns. Reports he remembers to take cholesterol medication on most days. - Established with [facility] for diabetes and thyroid management. He is aware to keep all scheduled appointments. - No further issues/concerns for discussion today.  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     glimepiride (AMARYL) 4 MG tablet TAKE 2 TABLETS(8 MG) BY MOUTH DAILY WITH BREAKFAST 180 tablet 0   levothyroxine (SYNTHROID) 112 MCG tablet Take 112 mcg by mouth daily.     metFORMIN (GLUCOPHAGE) 500 MG tablet TAKE 2 TABLETS(1000 MG) BY MOUTH TWICE DAILY WITH A MEAL 120 tablet 3   metoprolol succinate (TOPROL-XL) 100 MG 24 hr tablet TAKE 1 TABLET BY MOUTH EVERY DAY IMMEDIATELY FOLLOWING A MEAL 90 tablet 0   No current facility-administered medications on file prior to visit.    Allergies  Allergen Reactions   Dapagliflozin Nausea And Vomiting   Dulaglutide Diarrhea    Sitagliptin Nausea And Vomiting and Other (See Comments)    pancreatitis    Social History   Socioeconomic History   Marital status: Married    Spouse name: Rosey Bath   Number of children: 0   Years of education: HS   Highest education level: Some college, no degree  Occupational History   Occupation: service rep   Occupation: retired    Associate Professor: Water quality scientist  Tobacco Use   Smoking status: Every Day    Packs/day: 0.50    Years: 32.00    Additional pack years: 0.00    Total pack years: 16.00    Types: Cigarettes    Passive exposure: Current   Smokeless tobacco: Never   Tobacco comments:    10/26/15 trying to quit, 12/21/21 smoke on weekends only  Vaping Use   Vaping Use: Never used  Substance and Sexual Activity   Alcohol use: No    Alcohol/week: 0.0 standard drinks of alcohol   Drug use: No   Sexual activity: Yes    Comment: 1 partner in last 12 months  Other Topics Concern   Not on file  Social History Narrative   Marital status:  Married x 20 years; happily.      Children: none      Lives: with wife.      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      Alcohol: None      Drugs: none      Exercise: treadmill  sporadic.   Social Determinants of Health   Financial Resource Strain: Low Risk  (02/23/2023)   Overall Financial Resource Strain (CARDIA)    Difficulty of Paying Living Expenses: Not hard at all  Food Insecurity: No Food Insecurity (02/23/2023)   Hunger Vital Sign    Worried About Running Out of Food in the Last Year: Never true    Ran Out of Food in the Last Year: Never true  Transportation Needs: Unknown (02/23/2023)   PRAPARE - Transportation    Lack of Transportation (Medical): No    Lack of Transportation (Non-Medical): Patient declined  Physical Activity: Unknown (02/23/2023)   Exercise Vital Sign    Days of Exercise per Week: Patient declined    Minutes of Exercise per Session: 60  min  Stress: No Stress Concern Present (02/23/2023)   [employer] of Occupational Health - Occupational Stress Questionnaire    Feeling of Stress : Not at all  Social Connections: Unknown (02/23/2023)   Social Connection and Isolation Panel [NHANES]    Frequency of Communication with Friends and Family: Once a week    Frequency of Social Gatherings with Friends and Family: Patient declined    Attends Religious Services: Never    Database administrator or Organizations: No    Attends Engineer, structural: Not on file    Marital Status: Married  Catering manager Violence: Not on file    Family History  Adopted: Yes  Problem Relation Age of Onset   Ataxia Mother 97       SCA3   Ataxia Maternal Grandmother        SCA3   Ataxia Maternal Aunt        SCA3    Past Surgical History:  Procedure Laterality Date   BIOPSY THYROID  02/06/2009   cold thyroid nodule; negative/benign.   COLONOSCOPY W/ POLYPECTOMY  Feb 2014   5 polyps   TOOTH EXTRACTION  10/2015   x 4 teeth    ROS: Review of Systems Negative except as stated above  PHYSICAL EXAM: BP 136/78   Pulse 67   SpO2 99%   Physical Exam HENT:     Head: Normocephalic and atraumatic.  Eyes:     Extraocular Movements: Extraocular movements intact.     Conjunctiva/sclera: Conjunctivae normal.     Pupils: Pupils are equal, round, and reactive to light.  Cardiovascular:     Rate and Rhythm: Normal rate and regular rhythm.     Pulses: Normal pulses.     Heart sounds: Normal heart sounds.  Pulmonary:     Effort: Pulmonary effort is normal.     Breath sounds: Normal breath sounds.  Musculoskeletal:     Cervical back: Normal range of motion and neck supple.  Neurological:     General: No focal deficit present.     Mental Status: He is alert and oriented to person, place, and time.  Psychiatric:        Mood and Affect: Mood normal.        Behavior: Behavior normal.      ASSESSMENT AND PLAN: 1. Primary  hypertension - Continue Metoprolol Succinate as prescribed. No refills needed as of present.  - Continue Lisinopril and Hydrochlorothiazide as prescribed. - Counseled on blood pressure goal of less than 130/80, low-sodium, DASH diet, medication compliance, and 150 minutes of moderate intensity exercise per week as tolerated. Counseled on medication adherence and adverse effects. - Follow-up with primary provider in 3 months or sooner if needed.  -  lisinopril (ZESTRIL) 40 MG tablet; TAKE ONE TABLET BY MOUTH EVERY DAY  Dispense: 90 tablet; Refill: 0 - hydrochlorothiazide (HYDRODIURIL) 25 MG tablet; TAKE ONE TABLET BY MOUTH EVERY DAY  Dispense: 90 tablet; Refill: 0  2. Dyslipidemia - Continue Rosuvastatin as prescribed.  - Routine screening.  - Follow-up with primary provider in 3 months or sooner if needed.  - Lipid panel - rosuvastatin (CRESTOR) 10 MG tablet; Take 1 tablet (10 mg total) by mouth daily.  Dispense: 90 tablet; Refill: 0   Patient was given the opportunity to ask questions.  Patient verbalized understanding of the plan and was able to repeat key elements of the plan. Patient was given clear instructions to go to Emergency Department or return to medical center if symptoms don't improve, worsen, or new problems develop.The patient verbalized understanding.   Orders Placed This Encounter  Procedures   Lipid panel     Requested Prescriptions   Signed Prescriptions Disp Refills   lisinopril (ZESTRIL) 40 MG tablet 90 tablet 0    Sig: TAKE ONE TABLET BY MOUTH EVERY DAY   hydrochlorothiazide (HYDRODIURIL) 25 MG tablet 90 tablet 0    Sig: TAKE ONE TABLET BY MOUTH EVERY DAY   rosuvastatin (CRESTOR) 10 MG tablet 90 tablet 0    Sig: Take 1 tablet (10 mg total) by mouth daily.    Return in about 3 months (around 05/29/2023) for Follow-Up or next available chronic care mgmt .  [name], NP

## 2023-02-26 ENCOUNTER — Encounter: Payer: Self-pay | Admitting: Family

## 2023-02-26 ENCOUNTER — Ambulatory Visit (INDEPENDENT_AMBULATORY_CARE_PROVIDER_SITE_OTHER): Payer: Medicare Other | Admitting: Family

## 2023-02-26 VITALS — BP 136/78 | HR 67

## 2023-02-26 DIAGNOSIS — E785 Hyperlipidemia, unspecified: Secondary | ICD-10-CM | POA: Diagnosis not present

## 2023-02-26 DIAGNOSIS — I10 Essential (primary) hypertension: Secondary | ICD-10-CM | POA: Diagnosis not present

## 2023-02-26 MED ORDER — LISINOPRIL 40 MG PO TABS
ORAL_TABLET | ORAL | 0 refills | Status: DC
Start: 2023-02-26 — End: 2023-06-06

## 2023-02-26 MED ORDER — ROSUVASTATIN CALCIUM 10 MG PO TABS
10.0000 mg | ORAL_TABLET | Freq: Every day | ORAL | 0 refills | Status: DC
Start: 2023-02-26 — End: 2023-11-14

## 2023-02-26 MED ORDER — HYDROCHLOROTHIAZIDE 25 MG PO TABS
ORAL_TABLET | ORAL | 0 refills | Status: DC
Start: 2023-02-26 — End: 2023-06-06

## 2023-02-27 LAB — LIPID PANEL
Chol/HDL Ratio: 4.3 ratio (ref 0.0–5.0)
Cholesterol, Total: 167 mg/dL (ref 100–199)
HDL: 39 mg/dL — ABNORMAL LOW (ref >39)
LDL Chol Calc (NIH): 99 mg/dL (ref 0–99)
Triglycerides: 163 mg/dL — ABNORMAL HIGH (ref 0–149)
VLDL Cholesterol Cal: 29 mg/dL (ref 5–40)

## 2023-02-28 ENCOUNTER — Telehealth: Payer: Self-pay

## 2023-02-28 NOTE — Telephone Encounter (Signed)
-----   Message from [name], NP sent at 02/27/2023  7:54 AM EDT ----- Continue Rosuvastatin as prescribed for cholesterol management.

## 2023-02-28 NOTE — Telephone Encounter (Signed)
Pt was called and vm was left, Information has been sent to nurse pool.   

## 2023-03-09 ENCOUNTER — Ambulatory Visit: Payer: Self-pay | Admitting: *Deleted

## 2023-03-09 NOTE — Telephone Encounter (Signed)
Reason for Disposition  [1] Follow-up call to recent contact AND [2] information only call, no triage required  Answer Assessment - Initial Assessment Questions 1. REASON FOR CALL or QUESTION: "What is your reason for calling today?" or "How can I best help you?" or "What question do you have that I can help answer?"     Pt called in and was given his lab result message from [name], NP  Protocols used: Information Only Call - No Triage-A-AH

## 2023-03-09 NOTE — Telephone Encounter (Signed)
Pt given lab results per notes of [name], NP on 02/27/2023 at 7:54 AM.    Pt verbalized understanding.   No questions.  Called him back because I missed the message about him continuing his rosuvastatin.   Left a voicemail to continue taking the rosuvastatin for his cholesterol.

## 2023-04-30 ENCOUNTER — Other Ambulatory Visit: Payer: Self-pay | Admitting: Family

## 2023-04-30 DIAGNOSIS — I10 Essential (primary) hypertension: Secondary | ICD-10-CM

## 2023-04-30 DIAGNOSIS — E1165 Type 2 diabetes mellitus with hyperglycemia: Secondary | ICD-10-CM

## 2023-05-02 ENCOUNTER — Other Ambulatory Visit: Payer: Self-pay

## 2023-05-02 ENCOUNTER — Other Ambulatory Visit: Payer: Self-pay | Admitting: Family

## 2023-05-02 DIAGNOSIS — I10 Essential (primary) hypertension: Secondary | ICD-10-CM

## 2023-05-02 MED ORDER — METOPROLOL SUCCINATE ER 100 MG PO TB24
ORAL_TABLET | ORAL | 0 refills | Status: DC
Start: 2023-05-02 — End: 2023-06-06

## 2023-05-02 MED ORDER — METOPROLOL SUCCINATE ER 100 MG PO TB24
ORAL_TABLET | ORAL | 0 refills | Status: DC
Start: 2023-05-02 — End: 2023-05-02

## 2023-05-02 NOTE — Telephone Encounter (Signed)
I let this Pt know his diabetic meds will be refilled through [facility]. Pt is informed that Metoprolol was subscribed.

## 2023-05-02 NOTE — Telephone Encounter (Signed)
Call patient with update.  - During office visit 02/26/2023 patient reported he is established with [facility] for diabetes management. Please request Glimepiride refills from the same. If I can further assist please make me aware.  - Metoprolol ER Succinate prescribed.

## 2023-05-21 ENCOUNTER — Other Ambulatory Visit: Payer: Self-pay | Admitting: Family

## 2023-05-21 ENCOUNTER — Other Ambulatory Visit: Payer: Self-pay

## 2023-05-21 ENCOUNTER — Telehealth: Payer: Self-pay | Admitting: Family

## 2023-05-21 DIAGNOSIS — E1165 Type 2 diabetes mellitus with hyperglycemia: Secondary | ICD-10-CM

## 2023-05-21 MED ORDER — METFORMIN HCL 500 MG PO TABS
ORAL_TABLET | ORAL | 3 refills | Status: AC
Start: 2023-05-21 — End: ?

## 2023-05-21 NOTE — Telephone Encounter (Signed)
Called pt and left vm to call office back to reschedule appt canceled via MyChart.

## 2023-05-21 NOTE — Telephone Encounter (Signed)
Medication Refill - Medication:     MetFORMIN (GLUCOPHAGE) 500 MG  and    Taglimepiride (AMARYL) 4 MG tablet    Has the patient contacted their pharmacy? YES     Preferred Pharmacy?    [id number] [place], [place] - [phone] AT [facility] OF [facility] & [address]  [address] [place] [address]  Phone:  [phone]  Fax:  [phone]    Has the patient been seen for an appointment in the last year OR does the patient have an upcoming appointment? YES

## 2023-05-23 MED ORDER — GLIMEPIRIDE 4 MG PO TABS: 4.0000 mg | ORAL_TABLET | Freq: Every day | ORAL | 0 refills | Status: DC

## 2023-05-23 NOTE — Telephone Encounter (Signed)
Requested Prescriptions  Pending Prescriptions Disp Refills   metFORMIN (GLUCOPHAGE) 500 MG tablet 120 tablet 3    Sig: TAKE 2 TABLETS(1000 MG) BY MOUTH TWICE DAILY WITH A MEAL     Endocrinology:  Diabetes - Biguanides Failed - 05/21/2023 12:48 PM      Failed - HBA1C is between 0 and 7.9 and within 180 days    Hemoglobin A1C  Date Value Ref Range Status  02/14/2022 8.4 (A) 4.0 - 5.6 % Final   Hgb A1c MFr Bld  Date Value Ref Range Status  03/16/2021 8.7 (H) 4.8 - 5.6 % Final    Comment:             Prediabetes: 5.7 - 6.4          Diabetes: >6.4          Glycemic control for adults with diabetes: <7.0          Failed - B12 Level in normal range and within 720 days    Vitamin B-12  Date Value Ref Range Status  11/15/2012 817 211 - 911 pg/mL Final         Failed - CBC within normal limits and completed in the last 12 months    WBC  Date Value Ref Range Status  03/16/2021 9.4 3.4 - 10.8 x10E3/uL Final  05/25/2016 10.1 3.8 - 10.8 K/uL Final   RBC  Date Value Ref Range Status  03/16/2021 4.08 (L) 4.14 - 5.80 x10E6/uL Final  05/25/2016 4.40 4.20 - 5.80 MIL/uL Final   Hemoglobin  Date Value Ref Range Status  03/16/2021 12.4 (L) 13.0 - 17.7 g/dL Final   Hematocrit  Date Value Ref Range Status  03/16/2021 37.9 37.5 - 51.0 % Final   MCHC  Date Value Ref Range Status  03/16/2021 32.7 31.5 - 35.7 g/dL Final  86/57/8469 62.9 32.0 - 36.0 g/dL Final   [facility]  Date Value Ref Range Status  03/16/2021 30.4 26.6 - 33.0 pg Final  05/25/2016 31.1 27.0 - 33.0 pg Final   MCV  Date Value Ref Range Status  03/16/2021 93 79 - 97 fL Final   Platelet Count, POC  Date Value Ref Range Status  07/19/2013 269 142 - 424 K/uL Final   RDW  Date Value Ref Range Status  03/16/2021 11.1 (L) 11.6 - 15.4 % Final   RDW, POC  Date Value Ref Range Status  07/19/2013 13.1 % Final         Passed - Cr in normal range and within 360 days    Creat  Date Value Ref Range Status  05/25/2016 0.99  0.70 - 1.33 mg/dL Final    Comment:      For patients > or = 63 years of age: The upper reference limit for Creatinine is approximately 13% higher for people identified as African-American.      Creatinine, Ser  Date Value Ref Range Status  11/28/2022 0.88 0.76 - 1.27 mg/dL Final   Creatinine, Urine  Date Value Ref Range Status  05/25/2016 278 20 - 370 mg/dL Final         Passed - eGFR in normal range and within 360 days    GFR, Est African American  Date Value Ref Range Status  10/31/2013 >89 mL/min Final   GFR calc Af Amer  Date Value Ref Range Status  02/20/2020 110 >59 mL/min/1.73 Final    Comment:    **[facility] currently reports eGFR in compliance with the current**   recommendations of  the [employer]. [facility] will   update reporting as new guidelines are published from the NKF-ASN   Task force.    GFR, Est Non African American  Date Value Ref Range Status  10/31/2013 >89 mL/min Final    Comment:      The estimated GFR is a calculation valid for adults (>=42 years old) that uses the CKD-EPI algorithm to adjust for age and sex. It is   not to be used for children, pregnant women, hospitalized patients,    patients on dialysis, or with rapidly changing kidney function. According to the NKDEP, eGFR >89 is normal, 60-89 shows mild impairment, 30-59 shows moderate impairment, 15-29 shows severe impairment and <15 is ESRD.     GFR calc non Af Amer  Date Value Ref Range Status  02/20/2020 95 >59 mL/min/1.73 Final   eGFR  Date Value Ref Range Status  11/28/2022 97 >59 mL/min/1.73 Final         Passed - Valid encounter within last 6 months    Recent Outpatient Visits           2 months ago Primary hypertension   [facility] [facility], [place], [place]   5 months ago Primary hypertension   [place] [facility], [place], [place]   1 year ago Encounter for power mobility device assessment   [facility], [place], [place]   1 year ago Encounter for power mobility device assessment   [place] [facility], Amy J, NP   1 year ago Essential (primary) hypertension   [place] [facility], [place], [place]       Future Appointments             In 2 weeks [name], NP [facility]             glimepiride (AMARYL) 4 MG tablet 90 tablet 0    Sig: Take 1 tablet (4 mg total) by mouth daily.     Endocrinology:  Diabetes - Sulfonylureas Failed - 05/21/2023 12:48 PM      Failed - HBA1C is between 0 and 7.9 and within 180 days    Hemoglobin A1C  Date Value Ref Range Status  02/14/2022 8.4 (A) 4.0 - 5.6 % Final   Hgb A1c MFr Bld  Date Value Ref Range Status  03/16/2021 8.7 (H) 4.8 - 5.6 % Final    Comment:             Prediabetes: 5.7 - 6.4          Diabetes: >6.4          Glycemic control for adults with diabetes: <7.0          Passed - Cr in normal range and within 360 days    Creat  Date Value Ref Range Status  05/25/2016 0.99 0.70 - 1.33 mg/dL Final    Comment:      For patients > or = 63 years of age: The upper reference limit for Creatinine is approximately 13% higher for people identified as African-American.      Creatinine, Ser  Date Value Ref Range Status  11/28/2022 0.88 0.76 - 1.27 mg/dL Final   Creatinine, Urine  Date Value Ref Range Status  05/25/2016 278 20 - 370 mg/dL Final  Passed - Valid encounter within last 6 months    Recent Outpatient Visits           2 months ago Primary hypertension   [place] [facility], [place], [place]   5 months ago Primary hypertension   [place] [facility], [place], [place]   1 year ago Encounter for power mobility device assessment   [place] [facility], [place], [place]   1 year ago Encounter for power  mobility device assessment   [place] [facility], Amy J, NP   1 year ago Essential (primary) hypertension   [place] [facility], Salomon Fick, NP       Future Appointments             In 2 weeks [name], NP [facility]

## 2023-05-29 ENCOUNTER — Ambulatory Visit: Payer: Medicare Other | Admitting: Family

## 2023-06-06 ENCOUNTER — Encounter: Payer: Self-pay | Admitting: Family

## 2023-06-06 ENCOUNTER — Ambulatory Visit (INDEPENDENT_AMBULATORY_CARE_PROVIDER_SITE_OTHER): Payer: Medicare Other | Admitting: Family

## 2023-06-06 VITALS — BP 131/79 | HR 65 | Temp 98.6°F | Ht 68.0 in | Wt 195.0 lb

## 2023-06-06 DIAGNOSIS — I10 Essential (primary) hypertension: Secondary | ICD-10-CM

## 2023-06-06 MED ORDER — HYDROCHLOROTHIAZIDE 25 MG PO TABS
ORAL_TABLET | ORAL | 0 refills | Status: DC
Start: 2023-06-06 — End: 2023-09-21

## 2023-06-06 MED ORDER — LISINOPRIL 40 MG PO TABS
ORAL_TABLET | ORAL | 0 refills | Status: DC
Start: 2023-06-06 — End: 2023-09-21

## 2023-06-06 MED ORDER — METOPROLOL SUCCINATE ER 100 MG PO TB24
ORAL_TABLET | ORAL | 0 refills | Status: DC
Start: 2023-06-06 — End: 2023-09-21

## 2023-06-06 NOTE — Progress Notes (Signed)
Patient ID: Randy Ballard, male    DOB: [birth year]  MRN: [id number]  CC: Chronic Conditions Follow-Up  Subjective: Randy Ballard is a 62 y.o. male who presents for chronic conditions follow-up.   His concerns today include:  Doing well on Metoprolol, Lisinopril, and Hydrochlorothiazide, no issues/concerns. He does not complain of red flag symptoms such as but not limited to chest pain, shortness of breath, worst headache of life, nausea/vomiting.   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     glimepiride (AMARYL) 4 MG tablet Take 1 tablet (4 mg total) by mouth daily. 90 tablet 0   levothyroxine (SYNTHROID) 112 MCG tablet Take 112 mcg by mouth daily.     metFORMIN (GLUCOPHAGE) 500 MG tablet TAKE 2 TABLETS(1000 MG) BY MOUTH TWICE DAILY WITH A MEAL 120 tablet 3   rosuvastatin (CRESTOR) 10 MG tablet Take 1 tablet (10 mg total) by mouth daily. 90 tablet 0   No current facility-administered medications on file prior to visit.    Allergies  Allergen Reactions   Dapagliflozin Nausea And Vomiting   Dulaglutide Diarrhea   Sitagliptin Nausea And Vomiting and Other (See Comments)    pancreatitis    Social History   Socioeconomic History   Marital status: Married    Spouse name: Rosey Bath   Number of children: 0   Years of education: HS   Highest education level: Some college, no degree  Occupational History   Occupation: service rep   Occupation:  retired    Associate Professor: Water quality scientist  Tobacco Use   Smoking status: Every Day    Current packs/day: 0.50    Average packs/day: 0.5 packs/day for 32.0 years (16.0 ttl pk-yrs)    Types: Cigarettes    Passive exposure: Current   Smokeless tobacco: Never   Tobacco comments:    10/26/15 trying to quit, 12/21/21 smoke on weekends only  Vaping Use   Vaping status: Never Used  Substance and Sexual Activity   Alcohol use: No    Alcohol/week: 0.0 standard drinks of alcohol   Drug use: No   Sexual activity: Yes    Comment: 1 partner in last 12 months  Other Topics Concern   Not on file  Social History Narrative   Marital status:  Married x 20 years; happily.      Children: none      Lives: with wife.      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      Alcohol: None      Drugs: none      Exercise: treadmill sporadic.   Social Determinants of Health   Financial Resource Strain: Low Risk  (02/23/2023)   Overall Financial Resource Strain (CARDIA)    Difficulty of Paying Living Expenses: Not hard at all  Food Insecurity: No Food Insecurity (02/23/2023)   Hunger Vital Sign    Worried About Running Out of Food  in the Last Year: Never true    Ran Out of Food in the Last Year: Never true  Transportation Needs: Unknown (02/23/2023)   PRAPARE - Transportation    Lack of Transportation (Medical): No    Lack of Transportation (Non-Medical): Patient declined  Physical Activity: Unknown (02/23/2023)   Exercise Vital Sign    Days of Exercise per Week: Patient declined    Minutes of Exercise per Session: 60 min  Stress: No Stress Concern Present (02/23/2023)   [employer] of Occupational Health - Occupational Stress Questionnaire    Feeling of Stress : Not at all  Social Connections: Unknown (02/23/2023)   Social Connection and Isolation Panel [NHANES]    Frequency of Communication with Friends and Family: Once a week     Frequency of Social Gatherings with Friends and Family: Patient declined    Attends Religious Services: Never    Database administrator or Organizations: No    Attends Engineer, structural: Not on file    Marital Status: Married  Catering manager Violence: Not on file    Family History  Adopted: Yes  Problem Relation Age of Onset   Ataxia Mother 31       SCA3   Ataxia Maternal Grandmother        SCA3   Ataxia Maternal Aunt        SCA3    Past Surgical History:  Procedure Laterality Date   BIOPSY THYROID  02/06/2009   cold thyroid nodule; negative/benign.   COLONOSCOPY W/ POLYPECTOMY  Feb 2014   5 polyps   TOOTH EXTRACTION  10/2015   x 4 teeth    ROS: Review of Systems Negative except as stated above  PHYSICAL EXAM: BP 131/79   Pulse 65   Temp 98.6 F (37 C) (Oral)   Ht 5\' 8"  (1.727 m)   Wt 195 lb (88.5 kg)   SpO2 98%   BMI 29.65 kg/m   Physical Exam HENT:     Head: Normocephalic and atraumatic.     Nose: Nose normal.     Mouth/Throat:     Mouth: Mucous membranes are moist.     Pharynx: Oropharynx is clear.  Eyes:     Extraocular Movements: Extraocular movements intact.     Conjunctiva/sclera: Conjunctivae normal.     Pupils: Pupils are equal, round, and reactive to light.  Cardiovascular:     Rate and Rhythm: Normal rate and regular rhythm.     Pulses: Normal pulses.     Heart sounds: Normal heart sounds.  Pulmonary:     Effort: Pulmonary effort is normal.     Breath sounds: Normal breath sounds.  Musculoskeletal:        General: Normal range of motion.     Cervical back: Normal range of motion and neck supple.  Neurological:     General: No focal deficit present.     Mental Status: He is alert and oriented to person, place, and time.  Psychiatric:        Mood and Affect: Mood normal.        Behavior: Behavior normal.     ASSESSMENT AND PLAN: 1. Primary hypertension - Continue Hydrochlorothiazide, Lisinopril, and Metoprolol Succinate  as prescribed.  - Counseled on blood pressure goal of less than 130/80, low-sodium, DASH diet, medication compliance, and 150 minutes of moderate intensity exercise per week as tolerated. Counseled on medication adherence and adverse effects. - Routine screening.  - Follow-up with primary provider in 3  months or sooner if needed. - hydrochlorothiazide (HYDRODIURIL) 25 MG tablet; TAKE ONE TABLET BY MOUTH EVERY DAY  Dispense: 90 tablet; Refill: 0 - lisinopril (ZESTRIL) 40 MG tablet; TAKE ONE TABLET BY MOUTH EVERY DAY  Dispense: 90 tablet; Refill: 0 - metoprolol succinate (TOPROL-XL) 100 MG 24 hr tablet; TAKE 1 TABLET BY MOUTH EVERY DAY IMMEDIATELY FOLLOWING A MEAL  Dispense: 90 tablet; Refill: 0 - Basic Metabolic Panel   Patient was given the opportunity to ask questions.  Patient verbalized understanding of the plan and was able to repeat key elements of the plan. Patient was given clear instructions to go to Emergency Department or return to medical center if symptoms don't improve, worsen, or new problems develop.The patient verbalized understanding.   Orders Placed This Encounter  Procedures   Basic Metabolic Panel     Requested Prescriptions   Signed Prescriptions Disp Refills   hydrochlorothiazide (HYDRODIURIL) 25 MG tablet 90 tablet 0    Sig: TAKE ONE TABLET BY MOUTH EVERY DAY   lisinopril (ZESTRIL) 40 MG tablet 90 tablet 0    Sig: TAKE ONE TABLET BY MOUTH EVERY DAY   metoprolol succinate (TOPROL-XL) 100 MG 24 hr tablet 90 tablet 0    Sig: TAKE 1 TABLET BY MOUTH EVERY DAY IMMEDIATELY FOLLOWING A MEAL    Return in about 3 months (around 09/06/2023) for Follow-Up or next available chronic conditions .  [name], NP

## 2023-06-06 NOTE — Progress Notes (Signed)
Pt states stomach bubbles every now and again.

## 2023-06-07 LAB — BASIC METABOLIC PANEL
BUN/Creatinine Ratio: 9 — ABNORMAL LOW (ref 10–24)
BUN: 9 mg/dL (ref 8–27)
CO2: 24 mmol/L (ref 20–29)
Calcium: 9.3 mg/dL (ref 8.6–10.2)
Chloride: 98 mmol/L (ref 96–106)
Creatinine, Ser: 1.02 mg/dL (ref 0.76–1.27)
Glucose: 160 mg/dL — ABNORMAL HIGH (ref 70–99)
Potassium: 4.1 mmol/L (ref 3.5–5.2)
Sodium: 137 mmol/L (ref 134–144)
eGFR: 83 mL/min/{1.73_m2} (ref >59)

## 2023-08-17 ENCOUNTER — Other Ambulatory Visit: Payer: Self-pay | Admitting: Family

## 2023-08-17 ENCOUNTER — Other Ambulatory Visit: Payer: Self-pay

## 2023-08-17 DIAGNOSIS — E1165 Type 2 diabetes mellitus with hyperglycemia: Secondary | ICD-10-CM

## 2023-08-17 MED ORDER — GLIMEPIRIDE 4 MG PO TABS
4.0000 mg | ORAL_TABLET | Freq: Every day | ORAL | 0 refills | Status: DC
Start: 2023-08-17 — End: 2023-09-21

## 2023-08-22 ENCOUNTER — Other Ambulatory Visit: Payer: Self-pay | Admitting: Family

## 2023-08-22 ENCOUNTER — Other Ambulatory Visit: Payer: Self-pay

## 2023-08-22 DIAGNOSIS — I10 Essential (primary) hypertension: Secondary | ICD-10-CM

## 2023-09-07 ENCOUNTER — Encounter (HOSPITAL_COMMUNITY): Payer: Self-pay

## 2023-09-07 ENCOUNTER — Emergency Department (HOSPITAL_COMMUNITY)
Admission: EM | Admit: 2023-09-07 | Discharge: 2023-09-07 | Disposition: A | Discharge status: Home / Self Care | Payer: Medicare Other | Attending: Emergency Medicine | Admitting: Emergency Medicine

## 2023-09-07 ENCOUNTER — Other Ambulatory Visit: Payer: Self-pay

## 2023-09-07 DIAGNOSIS — Z7984 Long term (current) use of oral hypoglycemic drugs: Secondary | ICD-10-CM | POA: Insufficient documentation

## 2023-09-07 DIAGNOSIS — W19XXXA Unspecified fall, initial encounter: Secondary | ICD-10-CM | POA: Insufficient documentation

## 2023-09-07 DIAGNOSIS — Z23 Encounter for immunization: Secondary | ICD-10-CM | POA: Insufficient documentation

## 2023-09-07 DIAGNOSIS — S50911A Unspecified superficial injury of right forearm, initial encounter: Secondary | ICD-10-CM | POA: Diagnosis present

## 2023-09-07 DIAGNOSIS — S51811A Laceration without foreign body of right forearm, initial encounter: Secondary | ICD-10-CM | POA: Insufficient documentation

## 2023-09-07 DIAGNOSIS — Z79899 Other long term (current) drug therapy: Secondary | ICD-10-CM | POA: Diagnosis not present

## 2023-09-07 MED ORDER — LIDOCAINE-EPINEPHRINE (PF) 2 %-1:200000 IJ SOLN
10.0000 mL | Freq: Once | INTRAMUSCULAR | Status: AC
  Administered 2023-09-07: 10 mL via INTRADERMAL
  Filled 2023-09-07: qty 20

## 2023-09-07 MED ORDER — TETANUS-DIPHTH-ACELL PERTUSSIS 5-2.5-18.5 LF-MCG/0.5 IM SUSY
0.5000 mL | PREFILLED_SYRINGE | Freq: Once | INTRAMUSCULAR | Status: AC
  Administered 2023-09-07: 0.5 mL via INTRAMUSCULAR
  Filled 2023-09-07: qty 0.5

## 2023-09-07 MED ORDER — CEPHALEXIN 500 MG PO CAPS: ORAL_CAPSULE | ORAL | 0 refills | Status: DC

## 2023-09-07 NOTE — ED Provider Notes (Signed)
Randy Ballard Provider Note   CSN: [id number] Arrival date & time: 09/07/23  1733     History  Chief Complaint  Patient presents with   Randy Ballard is a 62 y.o. male.  62 yo M with a chief complaint of a fall.  He was trying to transfer from his bed to his wheelchair when he lost his balance and fell.  His joystick for his power chair comes loose at times and has fallen under his bed.  He thinks the screw underneath scratched his arm and caused a puncture wound.  He is unsure of his last tetanus.  Denies any other specific injury.   Fall       Home Medications Prior to Admission medications   Medication Sig Start Date End Date Taking? Authorizing Provider  cephALEXin (KEFLEX) 500 MG capsule 2 caps po bid x 7 days 09/07/23  Yes [name], DO  Cholecalciferol (VITAMIN D3) 5000 units CAPS Take 1 capsule (5,000 Units total) by mouth daily. 10/25/17   Randy Mocha, MD  glimepiride (AMARYL) 4 MG tablet TAKE 1 TABLET(4 MG) BY MOUTH DAILY 08/17/23   Randy Fendt, NP  glimepiride (AMARYL) 4 MG tablet Take 1 tablet (4 mg total) by mouth daily. 08/17/23   Randy Fendt, NP  hydrochlorothiazide (HYDRODIURIL) 25 MG tablet TAKE ONE TABLET BY MOUTH EVERY DAY 06/06/23   Randy Fendt, NP  levothyroxine (SYNTHROID) 112 MCG tablet Take 112 mcg by mouth daily. 03/09/22   [provider]  lisinopril (ZESTRIL) 40 MG tablet TAKE ONE TABLET BY MOUTH EVERY DAY 06/06/23   Randy Fendt, NP  metFORMIN (GLUCOPHAGE) 500 MG tablet TAKE 2 TABLETS(1000 MG) BY MOUTH TWICE DAILY WITH A MEAL 05/21/23   Randy Fendt, NP  metoprolol succinate (TOPROL-XL) 100 MG 24 hr tablet TAKE 1 TABLET BY MOUTH EVERY DAY IMMEDIATELY FOLLOWING A MEAL 06/06/23   Randy Fendt, NP  rosuvastatin (CRESTOR) 10 MG tablet Take 1 tablet (10 mg total) by mouth daily. 02/26/23 06/06/23  Randy Fendt, NP      Allergies    Dapagliflozin, Dulaglutide, and Sitagliptin     Review of Systems   Review of Systems  Physical Exam Updated Vital Signs BP (!) 157/139   Pulse 92   Temp 97.9 F (36.6 C) (Oral)   Resp 17   Ht 5\' 8"  (1.727 m)   Wt 90.3 kg   SpO2 100%   BMI 30.26 kg/m  Physical Exam Vitals and nursing note reviewed.  Constitutional:      Appearance: He is well-developed.  HENT:     Head: Normocephalic and atraumatic.  Eyes:     Pupils: Pupils are equal, round, and reactive to light.  Neck:     Vascular: No JVD.  Cardiovascular:     Rate and Rhythm: Normal rate and regular rhythm.     Heart sounds: No murmur heard.    No friction rub. No gallop.  Pulmonary:     Effort: No respiratory distress.     Breath sounds: No wheezing.  Abdominal:     General: There is no distension.     Tenderness: There is no abdominal tenderness. There is no guarding or rebound.  Musculoskeletal:        General: Normal range of motion.     Cervical back: Normal range of motion and neck supple.     Comments: Puncture wound to the right  medial forearm  Skin:    Coloration: Skin is not pale.     Findings: No rash.  Neurological:     Mental Status: He is alert and oriented to person, place, and time.  Psychiatric:        Behavior: Behavior normal.     ED Results / Procedures / Treatments   Labs (all labs ordered are listed, but only abnormal results are displayed) Labs Reviewed - No data to display  EKG None  Radiology No results found.  Procedures .Laceration Repair  Date/Time: 09/07/2023 8:41 PM  Performed by: [name], DO Authorized by: [name], DO   Consent:    Consent obtained:  Verbal   Consent given by:  Patient   Risks, benefits, and alternatives were discussed: yes     Risks discussed:  Infection, pain, poor cosmetic result and poor wound healing   Alternatives discussed:  No treatment Universal protocol:    Procedure explained and questions answered to patient or proxy's satisfaction: yes     Immediately prior to  procedure, a time out was called: yes     Patient identity confirmed:  Verbally with patient Anesthesia:    Anesthesia method:  Local infiltration   Local anesthetic:  Lidocaine 2% WITH epi Laceration details:    Location:  Shoulder/arm   Shoulder/arm location:  R lower arm   Length (cm):  8.2 Pre-procedure details:    Preparation:  Patient was prepped and draped in usual sterile fashion Exploration:    Hemostasis achieved with:  Epinephrine and direct pressure   Wound exploration: entire depth of wound visualized     Wound extent: no signs of injury and no underlying fracture   Treatment:    Area cleansed with:  Chlorhexidine   Amount of cleaning:  Standard   Irrigation solution:  Sterile saline   Irrigation volume:  50   Irrigation method:  Pressure wash   Debridement:  None   Undermining:  None   Scar revision: no   Skin repair:    Repair method:  Sutures   Suture size:  4-0   Suture material:  Nylon   Suture technique:  Simple interrupted   Number of sutures:  5 Approximation:    Approximation:  Close Repair type:    Repair type:  Simple Post-procedure details:    Dressing:  Antibiotic ointment and adhesive bandage   Procedure completion:  Tolerated well, no immediate complications     Medications Ordered in ED Medications  Tdap (BOOSTRIX) injection 0.5 mL (0.5 mLs Intramuscular Given 09/07/23 1837)  lidocaine-EPINEPHrine (XYLOCAINE W/EPI) 2 %-1:200000 (PF) injection 10 mL (10 mLs Intradermal Given by Other 09/07/23 1855)    ED Course/ Medical Decision Making/ A&P                                 Medical Decision Making Risk Prescription drug management.   62 yo M with a chief complaint of a puncture wound to his right forearm after falling.  He denies any other injury.  Will suture at bedside.  The patient was unsure of his last tetanus.  He is technically within the window but he is 9 years and 5 months post his last tetanus.  I will update at this  time.  8:42 PM:  I have discussed the diagnosis/risks/treatment options with the patient.  Evaluation and diagnostic testing in the emergency department does not suggest an emergent condition requiring  admission or immediate intervention beyond what has been performed at this time.  They will follow up with PCP. We also discussed returning to the ED immediately if new or worsening sx occur. We discussed the sx which are most concerning (e.g., sudden worsening pain, fever, inability to tolerate by mouth, redness, drainage) that necessitate immediate return. Medications administered to the patient during their visit and any new prescriptions provided to the patient are listed below.  Medications given during this visit Medications  Tdap (BOOSTRIX) injection 0.5 mL (0.5 mLs Intramuscular Given 09/07/23 1837)  lidocaine-EPINEPHrine (XYLOCAINE W/EPI) 2 %-1:200000 (PF) injection 10 mL (10 mLs Intradermal Given by Other 09/07/23 1855)     The patient appears reasonably screen and/or stabilized for discharge and I doubt any other medical condition or other [facility] requiring further screening, evaluation, or treatment in the ED at this time prior to discharge.          Final Clinical Impression(s) / ED Diagnoses Final diagnoses:  Forearm laceration, right, initial encounter    Rx / DC Orders ED Discharge Orders          Ordered    cephALEXin (KEFLEX) 500 MG capsule        09/07/23 2041              [name], DO 09/07/23 2042

## 2023-09-07 NOTE — ED Triage Notes (Addendum)
Patient brought in by [facility] after a fall that occurred today after trying to get back into power chair from bed. Patient endorses weakness, did not hit head, no LOC, no blood thinners. C-collar in place by EMS. Small puncture to right arm 1.5 in deep, 1 in wide, bleeding controlled. Patient hit arm on power chair knob during fall. Lac to left leg. Alert oriented x4.   EMS vitals 142/90 78 HR 18 RR 241 CBG

## 2023-09-07 NOTE — Discharge Instructions (Signed)
Return for redness drainage or if you get a fever.  The stitches that were placed in your arm typically are removed between day 7 and 10.  This can be done here at urgent care or at your family doctor's office.  The area can get wet but not fully immersed underwater.  No scrubbing.  If you really want to clean it you can apply a half-and-half hydrogen peroxide solution with water on a Q-tip.  You can apply an ointment a couple times a day this could be as simple as Vaseline but could also be an antibiotic ointment if you wish.  Once it is healed please try to avoid prolonged sun exposure use sunscreen.

## 2023-09-10 ENCOUNTER — Other Ambulatory Visit: Payer: Self-pay | Admitting: Family

## 2023-09-10 NOTE — Telephone Encounter (Signed)
Per last TSH 02/14/22- patient was to have Endocrine referral. Patient saw Bindubal Balan,MD 03/09/22 and medication dose was set at 112 mcg. (See scanned notes) Not sure is patient is still doing follow up with this provider for diabetes/thyroid management.

## 2023-09-10 NOTE — Telephone Encounter (Signed)
Medication Refill -  Most Recent Primary Care Visit:  Provider: [name] J  Department: [facility]  Visit Type: OFFICE VISIT  Date: 06/06/2023  Medication: levothyroxine (SYNTHROID) 112 MCG tablet [[id number]]  Pt does not know who the historical provider is. Pt has been out of medication for one week.  Has the patient contacted their pharmacy? Yes (Agent: If no, request that the patient contact the pharmacy for the refill. If patient does not wish to contact the pharmacy document the reason why and proceed with request.) (Agent: If yes, when and what did the pharmacy advise?)  Is this the correct pharmacy for this prescription? Yes If no, delete pharmacy and type the correct one.  This is the patient's preferred pharmacy:    [id number] [place], [place] - [phone] AT [facility] OF [facility] & [address] [place] [address] Phone: [phone] Fax: [phone]   Has the prescription been filled recently? No  Is the patient out of the medication? Yes  Has the patient been seen for an appointment in the last year OR does the patient have an upcoming appointment? Yes  Can we respond through MyChart? No  Agent: Please be advised that Rx refills may take up to 3 business days. We ask that you follow-up with your pharmacy.

## 2023-09-13 NOTE — Telephone Encounter (Signed)
Requested medication (s) are due for refill today: historical medication  Requested medication (s) are on the active medication list: yes   Last refill:  03/09/22  Future visit scheduled: yes in 1 month  Notes to clinic:  historical medication. Do you want to order Rx?     Requested Prescriptions  Pending Prescriptions Disp Refills   levothyroxine (SYNTHROID) 112 MCG tablet      Sig: Take 1 tablet (112 mcg total) by mouth daily.     Endocrinology:  Hypothyroid Agents Failed - 09/10/2023  2:30 PM      Failed - TSH in normal range and within 360 days    TSH  Date Value Ref Range Status  02/14/2022 0.145 (L) 0.450 - 4.500 uIU/mL Final         Passed - Valid encounter within last 12 months    Recent Outpatient Visits           3 months ago Primary hypertension   [place] [facility], [place], [place]   6 months ago Primary hypertension   [place] [facility], [place], [place]   9 months ago Primary hypertension   [place] [facility] at [facility], [place], NP   1 year ago Encounter for power mobility device assessment   [place] [facility] at [facility], [place], NP   1 year ago Encounter for power mobility device assessment   [place] [facility] at [facility], Salomon Fick, NP       Future Appointments             In 1 week [name], NP [facility]

## 2023-09-21 ENCOUNTER — Encounter: Payer: Self-pay | Admitting: Family

## 2023-09-21 ENCOUNTER — Ambulatory Visit (INDEPENDENT_AMBULATORY_CARE_PROVIDER_SITE_OTHER): Payer: Medicare Other | Admitting: Family

## 2023-09-21 VITALS — BP 145/81 | HR 74 | Temp 98.4°F | Ht 68.0 in | Wt 210.0 lb

## 2023-09-21 DIAGNOSIS — S51811D Laceration without foreign body of right forearm, subsequent encounter: Secondary | ICD-10-CM

## 2023-09-21 DIAGNOSIS — G118 Other hereditary ataxias: Secondary | ICD-10-CM | POA: Diagnosis not present

## 2023-09-21 DIAGNOSIS — I10 Essential (primary) hypertension: Secondary | ICD-10-CM

## 2023-09-21 MED ORDER — METOPROLOL SUCCINATE ER 100 MG PO TB24: ORAL_TABLET | ORAL | 0 refills | Status: AC

## 2023-09-21 MED ORDER — LISINOPRIL 40 MG PO TABS
ORAL_TABLET | ORAL | 0 refills | Status: AC
Start: 2023-09-21 — End: ?

## 2023-09-21 MED ORDER — HYDROCHLOROTHIAZIDE 25 MG PO TABS
ORAL_TABLET | ORAL | 0 refills | Status: AC
Start: 2023-09-21 — End: ?

## 2023-09-21 NOTE — Progress Notes (Signed)
Patient wants to know if you know any social workers.  States stitches can be removed.

## 2023-09-21 NOTE — Progress Notes (Signed)
Patient ID: Randy Ballard, male    DOB: 04/05/1961  MRN: [id number]  CC: Chronic Conditions Follow-Up  Subjective: Randy Ballard is a 62 y.o. male who presents for chronic conditions follow-up.  His concerns today include:  - States he doesn't always take Hydrochlorothiazide, Lisinopril, and Metoprolol Succinate due to forgetting. He does not complain of red flag symptoms such as but not limited to chest pain, shortness of breath, worst headache of life, nausea/vomiting.  - Requests referral back to Neurology for ataxia. Also, requests social worker to call him. States he needs assistance with activities of daily living.  - Seen on 09/07/2023 at [facility] for forearm laceration right. States he was told to keep sutures in place for 7 to 10 days and to follow-up with Primary Care for removal.   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     glimepiride (AMARYL) 4 MG tablet TAKE 1 TABLET(4 MG) BY MOUTH DAILY 90 tablet 0   levothyroxine (SYNTHROID) 112 MCG tablet Take 112 mcg by mouth daily.     metFORMIN (GLUCOPHAGE) 500 MG tablet TAKE 2 TABLETS(1000 MG) BY MOUTH TWICE DAILY WITH A MEAL 120 tablet 3   cephALEXin (KEFLEX) 500 MG capsule 2 caps po bid x 7 days (Patient not taking: Reported on 09/21/2023) 28 capsule 0   rosuvastatin (CRESTOR) 10 MG tablet Take 1 tablet (10 mg total) by mouth daily. 90 tablet 0    No current facility-administered medications on file prior to visit.    Allergies  Allergen Reactions   Dapagliflozin Nausea And Vomiting   Dulaglutide Diarrhea   Sitagliptin Nausea And Vomiting and Other (See Comments)    pancreatitis    Social History   Socioeconomic History   Marital status: Married    Spouse name: Rosey Bath   Number of children: 0   Years of education: HS   Highest education level: 12th grade  Occupational History   Occupation: service rep   Occupation: retired    Associate Professor: Water quality scientist  Tobacco Use   Smoking status: Every Day    Current packs/day: 0.50    Average packs/day: 0.5 packs/day for 32.0 years (16.0 ttl pk-yrs)    Types: Cigarettes    Passive exposure: Current   Smokeless tobacco: Never   Tobacco comments:    10/26/15 trying to quit, 12/21/21 smoke on weekends only  Vaping Use   Vaping status: Never Used  Substance and Sexual Activity   Alcohol use: No    Alcohol/week: 0.0 standard drinks of alcohol   Drug use: No   Sexual activity: Yes    Comment: 1 partner in last 12 months  Other Topics Concern   Not on file  Social History Narrative   Marital status:  Married x 20 years; happily.      Children: none      Lives: with wife.      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      Alcohol: None      Drugs: none      Exercise: treadmill sporadic.   Social Drivers of Corporate investment banker Strain: Low Risk  (09/14/2023)   Overall Financial Resource Strain (CARDIA)    Difficulty of Paying Living Expenses: Not hard at all  Food Insecurity: No Food Insecurity (09/14/2023)   Hunger Vital Sign    Worried About Running Out of Food in the Last Year: Never true    Ran Out of Food in the Last Year: Never true  Transportation Needs: No Transportation Needs (09/14/2023)   PRAPARE - Administrator, Civil Service (Medical): No    Lack of Transportation  (Non-Medical): No  Physical Activity: Inactive (09/14/2023)   Exercise Vital Sign    Days of Exercise per Week: 0 days    Minutes of Exercise per Session: 60 min  Stress: No Stress Concern Present (09/14/2023)   [employer] of Occupational Health - Occupational Stress Questionnaire    Feeling of Stress : Only a little  Social Connections: Socially Isolated (09/14/2023)   Social Connection and Isolation Panel [NHANES]    Frequency of Communication with Friends and Family: Once a week    Frequency of Social Gatherings with Friends and Family: Once a week    Attends Religious Services: Never    Database administrator or Organizations: No    Attends Engineer, structural: Not on file    Marital Status: Married  Catering manager Violence: Not on file    Family History  Adopted: Yes  Problem Relation Age of Onset   Ataxia Mother 75       SCA3   Ataxia Maternal Grandmother        SCA3   Ataxia Maternal Aunt        SCA3    Past Surgical History:  Procedure Laterality Date   BIOPSY THYROID  02/06/2009   cold thyroid nodule; negative/benign.   COLONOSCOPY W/ POLYPECTOMY  Feb 2014   5 polyps   TOOTH EXTRACTION  10/2015   x 4 teeth    ROS: Review of Systems Negative except as stated above  PHYSICAL EXAM: BP (!) 145/81   Pulse 74   Temp 98.4 F (36.9 C) (Oral)   Ht 5\' 8"  (1.727 m)   Wt 210 lb (95.3 kg)   SpO2 99%   BMI 31.93 kg/m   Physical Exam HENT:     Head: Normocephalic and atraumatic.     Nose: Nose normal.     Mouth/Throat:     Mouth: Mucous membranes are moist.     Pharynx: Oropharynx is clear.  Eyes:     Extraocular Movements: Extraocular movements intact.     Conjunctiva/sclera: Conjunctivae normal.     Pupils: Pupils are equal, round, and reactive to light.  Cardiovascular:     Rate and Rhythm: Normal rate and regular rhythm.     Pulses: Normal pulses.     Heart sounds: Normal heart sounds.  Pulmonary:     Effort: Pulmonary effort is  normal.     Breath sounds: Normal breath sounds.  Musculoskeletal:        General: Normal range of motion.     Cervical back: Normal range of motion and neck supple.  Skin:    General: Skin is warm and dry.     Comments: Sutures right forearm with scar tissue, no  drainage.  Neurological:     General: No focal deficit present.     Mental Status: He is alert and oriented to person, place, and time.  Psychiatric:        Mood and Affect: Mood normal.        Behavior: Behavior normal.    ASSESSMENT AND PLAN: 1. Primary hypertension (Primary) - Blood pressure not at goal during today's visit. Patient asymptomatic without chest pressure, chest pain, palpitations, shortness of breath, worst headache of life, and any additional red flag symptoms. - Continue Lisinopril, Metoprolol Succinate, and Hydrochlorothiazide as prescribed.  - Counseled on blood pressure goal of less than 130/80, low-sodium, DASH diet, medication compliance, and 150 minutes of moderate intensity exercise per week as tolerated. Counseled on medication adherence and adverse effects. - Referral to Cardiology for evaluation/management. - Follow-up with primary provider as scheduled.  - Basic Metabolic Panel - Ambulatory referral to Cardiology - lisinopril (ZESTRIL) 40 MG tablet; TAKE ONE TABLET BY MOUTH EVERY DAY  Dispense: 90 tablet; Refill: 0 - metoprolol succinate (TOPROL-XL) 100 MG 24 hr tablet; TAKE 1 TABLET BY MOUTH EVERY DAY IMMEDIATELY FOLLOWING A MEAL  Dispense: 90 tablet; Refill: 0 - hydrochlorothiazide (HYDRODIURIL) 25 MG tablet; TAKE ONE TABLET BY MOUTH EVERY DAY  Dispense: 90 tablet; Refill: 0  2. SCA-3 (spinocerebellar ataxia type 3) (HCC) - Referral to Neurology for evaluation/management.  - Message sent to social worker for patient assistance with activities of daily living.  - Ambulatory referral to Neurology  3. Laceration of right forearm, subsequent encounter - I was unable to remove suture from right  forearm on today.  - Referral to Wound Care Center for evaluation/management. - AMB referral to wound care center      Patient was given the opportunity to ask questions.  Patient verbalized understanding of the plan and was able to repeat key elements of the plan. Patient was given clear instructions to go to Emergency Department or return to medical center if symptoms don't improve, worsen, or new problems develop.The patient verbalized understanding.   Orders Placed This Encounter  Procedures   Basic Metabolic Panel   Ambulatory referral to Cardiology   Ambulatory referral to Neurology   AMB referral to wound care center     Requested Prescriptions   Signed Prescriptions Disp Refills   lisinopril (ZESTRIL) 40 MG tablet 90 tablet 0    Sig: TAKE ONE TABLET BY MOUTH EVERY DAY   metoprolol succinate (TOPROL-XL) 100 MG 24 hr tablet 90 tablet 0    Sig: TAKE 1 TABLET BY MOUTH EVERY DAY IMMEDIATELY FOLLOWING A MEAL   hydrochlorothiazide (HYDRODIURIL) 25 MG tablet 90 tablet 0    Sig: TAKE ONE TABLET BY MOUTH EVERY DAY    Follow-up with primary provider as scheduled.   [name], NP

## 2023-09-22 LAB — BASIC METABOLIC PANEL
BUN/Creatinine Ratio: 16 (ref 10–24)
BUN: 16 mg/dL (ref 8–27)
CO2: 22 mmol/L (ref 20–29)
Calcium: 9.4 mg/dL (ref 8.6–10.2)
Chloride: 100 mmol/L (ref 96–106)
Creatinine, Ser: 0.98 mg/dL (ref 0.76–1.27)
Glucose: 136 mg/dL — ABNORMAL HIGH (ref 70–99)
Potassium: 4.5 mmol/L (ref 3.5–5.2)
Sodium: 141 mmol/L (ref 134–144)
eGFR: 87 mL/min/{1.73_m2} (ref >59)

## 2023-09-24 ENCOUNTER — Telehealth: Payer: Self-pay | Admitting: Family

## 2023-09-26 ENCOUNTER — Telehealth: Payer: Self-pay | Admitting: Licensed Clinical Social Worker

## 2023-09-26 NOTE — Telephone Encounter (Signed)
LCSWA called patient today to introduce herself and to assess patients' mental health needs. Patient did not answer the phone. LCSWA was able to leave a brief message with the patient asking them to return the call. Patient was referred by PCP for [facility].

## 2023-10-09 NOTE — Telephone Encounter (Signed)
 Received and contacted a while ago but wanted to respond here

## 2023-10-11 NOTE — Telephone Encounter (Signed)
 Thank you :)

## 2023-10-16 ENCOUNTER — Telehealth: Payer: Self-pay | Admitting: Licensed Clinical Social Worker

## 2023-10-16 ENCOUNTER — Telehealth: Payer: Self-pay | Admitting: Family

## 2023-10-16 NOTE — Telephone Encounter (Signed)
 Randy Ballard I met with patient this morning he stated that he recently had a fall this morning and had to have EMS come help him off the floor. He stated that his balance has been off  for a while the past month or so.  He reports that he needs assistance with home health with helping him to keep up and down and taken a bath.  He shared this information with Amy and myself. Could to refresh my memory on how I could best assist him with any home health assistance etc. I know I've asked this before but not sure that I have the resource onsite at [facility].

## 2023-10-16 NOTE — Telephone Encounter (Signed)
 Received and pt was contacted today

## 2023-10-16 NOTE — Telephone Encounter (Signed)
 Copied from CRM [id number]. Topic: General - Other >> Oct 16, 2023  9:14 AM Selinda RAMAN wrote: Reason for CRM: The patient called in stating he would like to speak with [facility]. They have been playing phone tag since before Christmas. Please assist patient further

## 2023-11-12 ENCOUNTER — Other Ambulatory Visit: Payer: Self-pay | Admitting: Family

## 2023-11-12 DIAGNOSIS — E1165 Type 2 diabetes mellitus with hyperglycemia: Secondary | ICD-10-CM

## 2023-11-14 ENCOUNTER — Telehealth: Payer: Self-pay | Admitting: Diagnostic Neuroimaging

## 2023-11-14 ENCOUNTER — Encounter: Payer: Self-pay | Admitting: Diagnostic Neuroimaging

## 2023-11-14 ENCOUNTER — Ambulatory Visit (INDEPENDENT_AMBULATORY_CARE_PROVIDER_SITE_OTHER): Payer: Medicare Other | Admitting: Diagnostic Neuroimaging

## 2023-11-14 VITALS — BP 147/76 | HR 66 | Ht 68.0 in | Wt 204.0 lb

## 2023-11-14 DIAGNOSIS — G118 Other hereditary ataxias: Secondary | ICD-10-CM | POA: Diagnosis not present

## 2023-11-14 NOTE — Patient Instructions (Signed)
  SPINOCEREBELLAR ATAXIA (type 3) - continue supportive care; needs home health care aid - continue exercises and fall precautions - home health referral --> nursing, PT, home aid; face-face eval today for assistive devices and hospital bed

## 2023-11-14 NOTE — Telephone Encounter (Signed)
 Pt has been accepted by [facility], they will reach out to him to set up.

## 2023-11-14 NOTE — Progress Notes (Signed)
 [facility]  PATIENT: Randy Ballard DOB: 1961/09/14  REFERRING CLINICIAN: Lorren Greig PARAS, NP  HISTORY FROM: patient  REASON FOR VISIT: follow up  HISTORICAL  CHIEF COMPLAINT:  Chief Complaint  Patient presents with   New Patient (Initial Visit)    Pt in 6 alone  Pt here for spinocerebellar ataxia type 3 Pt states weak not able to stand for a long period of times Pt states needing a hospital bed because he has fallen out his bed several times .Pt states need help  with ADL . Pt is requesting home health     HISTORY OF PRESENT ILLNESS:   UPDATE (11/14/23, VRP): Since last visit, having more issues with SCA progression. More falls, diff with mobility, speech and swallowing. Needs help with bathing. Needs hospital bed and ex pivot lift.   UPDATE (12/21/21, VRP): Since last visit, progression of symptoms. Has had several falls, and couldn't get up. Needed EMS to help him get up. Living at home with wife. Now essentially wheelchair only.   UPDATE 10/27/15: Since last visit, stable. Only 1 minor fall in last year. BP better. Trying to stay active. Having some dental work done, which affects food particle size, and then some swallow issues occur.   UPDATE 10/26/14 (VRP): Since last visit, has gone through PT evaluation with some benefit. Now doing exercises at home. No falls. No major pain or spasms. No slurred speech or trouble swallowing.   UPDATE 04/20/14: Since last visit, sxs are stable. Now approved for SSI/SSD, due to start in Sept 2015. Not able to afford PT yet, but hopefully in Oct 2015. Int muscle twitching, without pain.   PRIOR HPI (12/17/13): 63 year old male here for valuation of SCA type 3. 2012 patient developed some muscle weakness, difficulty climbing stairs, balance difficulty. By 2014 his balance is significantly affected. He went through physical therapy without relief. On 11/14/2013 patient quit his job because of these balance and walking difficulties.  Patient's mother developed balance and walking problems 15 years ago (when she was 64 years old), resulting in multiple falls. Eventually she was transitioned to a nursing home. She was ultimately diagnosed with spinocerebellar ataxia type III. Patient's maternal grandmother and maternal aunt also have similar condition. Patient himself does not have any children. Patient has no siblings. Patient underwent gene testing which was positive for spinocerebellar ataxia type III. Nowadays patient having some muscle twitching in his quadriceps, shoulders, arms as well as some slurred speech. He's having difficulty with uneven and slip resurfaces.  REVIEW OF SYSTEMS: Full 14 system review of systems performed and notable only for anemia numbness weakness back pain walking diff.    ALLERGIES: Allergies  Allergen Reactions   Dapagliflozin  Nausea And Vomiting   Dulaglutide  Diarrhea   Sitagliptin  Nausea And Vomiting and Other (See Comments)    pancreatitis    HOME MEDICATIONS: Outpatient Medications Prior to Visit  Medication Sig Dispense Refill   Cholecalciferol (VITAMIN D3) 5000 units CAPS Take 1 capsule (5,000 Units total) by mouth daily.     glimepiride  (AMARYL ) 4 MG tablet TAKE 1 TABLET(4 MG) BY MOUTH DAILY 90 tablet 0   hydrochlorothiazide  (HYDRODIURIL ) 25 MG tablet TAKE ONE TABLET BY MOUTH EVERY DAY 90 tablet 0   levothyroxine  (SYNTHROID ) 112 MCG tablet Take 112 mcg by mouth daily.     lisinopril  (ZESTRIL ) 40 MG tablet TAKE ONE TABLET BY MOUTH EVERY DAY 90 tablet 0   metFORMIN  (GLUCOPHAGE ) 500 MG tablet TAKE 2 TABLETS(1000 MG) BY MOUTH  TWICE DAILY WITH A MEAL 120 tablet 3   metoprolol  succinate (TOPROL -XL) 100 MG 24 hr tablet TAKE 1 TABLET BY MOUTH EVERY DAY IMMEDIATELY FOLLOWING A MEAL 90 tablet 0   rosuvastatin  (CRESTOR ) 10 MG tablet Take 10 mg by mouth at bedtime.     cephALEXin  (KEFLEX ) 500 MG capsule 2 caps po bid x 7 days (Patient not taking: Reported on 09/21/2023) 28 capsule 0    rosuvastatin  (CRESTOR ) 10 MG tablet Take 1 tablet (10 mg total) by mouth daily. 90 tablet 0   No facility-administered medications prior to visit.    PAST MEDICAL HISTORY: Past Medical History:  Diagnosis Date   Diabetes mellitus without complication (HCC)    Diverticulitis    Hyperlipidemia    Hypertension    Hypogonadism male    Neuromuscular disorder (HCC)    SCA-3 (spinocerebellar ataxia type 3) (HCC)    Thyroid  disease 02/06/2009   Graves s/p RAIU; cold nodule s/p negative biopsy    PAST SURGICAL HISTORY: Past Surgical History:  Procedure Laterality Date   BIOPSY THYROID   02/06/2009   cold thyroid  nodule; negative/benign.   COLONOSCOPY W/ POLYPECTOMY  Feb 2014   5 polyps   TOOTH EXTRACTION  10/2015   x 4 teeth    FAMILY HISTORY: Family History  Adopted: Yes  Problem Relation Age of Onset   Ataxia Mother 67       SCA3   Ataxia Maternal Grandmother        SCA3   Ataxia Maternal Aunt        SCA3    SOCIAL HISTORY:  Social History   Socioeconomic History   Marital status: Married    Spouse name: Verneita   Number of children: 0   Years of education: HS   Highest education level: 12th grade  Occupational History   Occupation: service rep   Occupation: retired    Associate Professor: WATER QUALITY SCIENTIST  Tobacco Use   Smoking status: Every Day    Current packs/day: 0.50    Average packs/day: 0.5 packs/day for 32.0 years (16.0 ttl pk-yrs)    Types: Cigarettes    Passive exposure: Current   Smokeless tobacco: Never   Tobacco comments:    10/26/15 trying to quit, 12/21/21 smoke on weekends only  Vaping Use   Vaping status: Never Used  Substance and Sexual Activity   Alcohol use: No    Alcohol/week: 0.0 standard drinks of alcohol   Drug use: No   Sexual activity: Yes    Comment: 1 partner in last 12 months  Other Topics Concern   Not on file  Social History Narrative   Marital status:  Married x 20 years; happily.      Children: none      Lives: with wife.      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      Alcohol: None      Drugs: none      Exercise: treadmill sporadic.   Social Drivers of Corporate Investment Banker Strain: Low Risk  (09/14/2023)   Overall Financial Resource Strain (CARDIA)    Difficulty of Paying Living Expenses: Not hard at all  Food Insecurity: No Food Insecurity (09/14/2023)   Hunger Vital Sign    Worried About Running Out of Food in the Last Year: Never true    Ran Out of Food in the Last Year: Never true  Transportation Needs: No Transportation  Needs (09/14/2023)   PRAPARE - Administrator, Civil Service (Medical): No    Lack of Transportation (Non-Medical): No  Physical Activity: Unknown (09/14/2023)   Exercise Vital Sign    Days of Exercise per Week: 0 days    Minutes of Exercise per Session: Not on file  Recent Concern: Physical Activity - Inactive (09/14/2023)   Exercise Vital Sign    Days of Exercise per Week: 0 days    Minutes of Exercise per Session: 60 min  Stress: No Stress Concern Present (09/14/2023)   [employer] of Occupational Health - Occupational Stress Questionnaire    Feeling of Stress : Only a little  Social Connections: Socially Isolated (09/14/2023)   Social Connection and Isolation Panel [NHANES]    Frequency of Communication with Friends and Family: Once a week    Frequency of Social Gatherings with Friends and Family: Once a week    Attends Religious Services: Never    Database Administrator or Organizations: No    Attends Engineer, Structural: Not on file    Marital Status: Married  Catering Manager Violence: Not on file    PHYSICAL EXAM  Vitals:   11/14/23 1205  BP: (!) 147/76  Pulse: 66  Weight: 204 lb (92.5 kg)  Height: 5' 8 (1.727 m)   Not recorded     Body mass index is 31.02 kg/m.  GENERAL EXAM: Patient is in no distress; well developed, nourished and groomed; neck is  supple  CARDIOVASCULAR: Regular rhythm; NO MURMURS  NEUROLOGIC: MENTAL STATUS: awake, alert, language fluent, comprehension intact, naming intact, fund of knowledge appropriate CRANIAL NERVE: pupils equal and reactive to light, visual fields full to confrontation, extraocular muscles intact, MILD SACCADIC DYSMETRIA, facial sensation and strength symmetric, hearing intact, palate elevates symmetrically, uvula midline, shoulder shrug symmetric, tongue midline; MILD DYSARTHRIA. MOTOR: ATROPHY OF HAND INTRINSIC MUSCLES; WEAKNESS IN BUE (3) AND BLE (2);  BRADYKINESIA IN BUE AND BLE SENSORY: ABSENT VIB AT TOES COORDINATION: MILD DYSMETRIA WITH LUE > RUE REFLEXES: BUE TRACE, KNEES TRACE, ANKLES 0 GAIT/STATION: IN POWERCHAIR    DIAGNOSTIC DATA (LABS, IMAGING, TESTING) - I reviewed patient records, labs, notes, testing and imaging myself where available.  Lab Results  Component Value Date   WBC 9.4 03/16/2021   HGB 12.4 (L) 03/16/2021   HCT 37.9 03/16/2021   MCV 93 03/16/2021   PLT 278 03/16/2021      Component Value Date/Time   NA 141 09/21/2023 1538   K 4.5 09/21/2023 1538   CL 100 09/21/2023 1538   CO2 22 09/21/2023 1538   GLUCOSE 136 (H) 09/21/2023 1538   GLUCOSE 161 (H) 05/25/2016 0827   BUN 16 09/21/2023 1538   CREATININE 0.98 09/21/2023 1538   CREATININE 0.99 05/25/2016 0827   CALCIUM  9.4 09/21/2023 1538   PROT 7.6 03/16/2021 1631   ALBUMIN 4.6 03/16/2021 1631   AST 27 03/16/2021 1631   ALT 33 03/16/2021 1631   ALKPHOS 86 03/16/2021 1631   BILITOT 0.4 03/16/2021 1631   GFRNONAA 95 02/20/2020 1510   GFRNONAA >89 10/31/2013 1621   GFRAA 110 02/20/2020 1510   GFRAA >89 10/31/2013 1621   Lab Results  Component Value Date   CHOL 167 02/26/2023   HDL 39 (L) 02/26/2023   LDLCALC 99 02/26/2023   TRIG 163 (H) 02/26/2023   CHOLHDL 4.3 02/26/2023   Lab Results  Component Value Date   HGBA1C 8.4 (A) 02/14/2022   Lab Results  Component Value Date  [id number] 817  11/15/2012   Lab Results  Component Value Date   TSH 0.145 (L) 02/14/2022    06/25/07 MRI brain (with and without)  1. 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  2. No anterior lobe pituitary lesions to suggest a micro- or macroadenoma resulting in thyroid  hormonal dysfunction.  3. Tiny focus of increased white matter signal in the right pons, likely due to small vessel disease from diabetes/hypertension.   ASSESSMENT AND PLAN  63 y.o. year old male here with spinocerebellar ataxia type 3, based on symptoms, family history and gene testing. Unfortunately there is no specific treatment for this condition.   Dx:  1. SCA-3 (spinocerebellar ataxia type 3) (HCC)     PLAN:  SPINOCEREBELLAR ATAXIA (type 3) - continue supportive care; needs home health care aid - continue exercises and fall precautions - home health referral --> nursing, PT, home aid; face-face eval today for assistive devices and hospital bed  Orders Placed This Encounter  Procedures   Ambulatory referral to Home Health   Return for return to PCP.    EDUARD FABIENE HANLON, MD 11/14/2023, 12:22 PM Certified in Neurology, Neurophysiology and Neuroimaging  [facility] [address] [place], [place] [address] [phone]

## 2023-11-15 ENCOUNTER — Other Ambulatory Visit: Payer: Self-pay | Admitting: Family

## 2023-11-15 DIAGNOSIS — I10 Essential (primary) hypertension: Secondary | ICD-10-CM

## 2023-11-23 ENCOUNTER — Telehealth: Payer: Self-pay | Admitting: Diagnostic Neuroimaging

## 2023-11-23 NOTE — Telephone Encounter (Signed)
Evaluation has been done today for the pt and Radova from [facility] is calling to request VO to continue OT with the requency of  1 x 9w starting today  Phone number is secure if a message is to be left. Festus Barren is also faxing a request for a  hostel lift with strap.

## 2023-11-23 NOTE — Telephone Encounter (Signed)
Called Radovan back. LVM advising ok for pt to continue OT.

## 2023-12-06 ENCOUNTER — Ambulatory Visit: Payer: Medicare Other

## 2023-12-06 DIAGNOSIS — Z0000 Encounter for general adult medical examination without abnormal findings: Secondary | ICD-10-CM

## 2023-12-06 DIAGNOSIS — Z1211 Encounter for screening for malignant neoplasm of colon: Secondary | ICD-10-CM

## 2023-12-06 NOTE — Progress Notes (Addendum)
 Subjective:   Randy Ballard is a 63 y.o. who presents for a Medicare Wellness preventive visit.  Visit Complete: Virtual I connected with  [name] on 12/06/23 by a audio enabled telemedicine application and verified that I am speaking with the correct person using two identifiers.  Patient Location: Home  Provider Location: Home Office  I discussed the limitations of evaluation and management by telemedicine. The patient expressed understanding and agreed to proceed.  Vital Signs: Because this visit was a virtual/telehealth visit, some criteria may be missing or patient reported. Any vitals not documented were not able to be obtained and vitals that have been documented are patient reported.  VideoDeclined- This patient declined Librarian, academic. Therefore the visit was completed with audio only.  AWV Questionnaire: Yes: Patient Medicare AWV questionnaire was completed by the patient on 12/06/2023; I have confirmed that all information answered by patient is correct and no changes since this date.  Cardiac Risk Factors include: advanced age (>34men, >76 women);diabetes mellitus;hypertension;male gender     Objective:    Today's Vitals   There is no height or weight on file to calculate BMI.     12/06/2023    1:04 PM 09/07/2023    5:40 PM 10/19/2022   11:48 AM 09/15/2014   10:26 AM 04/16/2013    3:32 PM  Advanced Directives  Does Patient Have a Medical Advance Directive? No No No No Patient does not have advance directive;Patient would not like information  Would patient like information on creating a medical advance directive?    Yes - Educational materials given     Current Medications (verified) Outpatient Encounter Medications as of 12/06/2023  Medication Sig   Cholecalciferol (VITAMIN D3) 5000 units CAPS Take 1 capsule (5,000 Units total) by mouth daily.   glimepiride (AMARYL) 4 MG tablet TAKE 1 TABLET(4 MG) BY MOUTH DAILY    hydrochlorothiazide (HYDRODIURIL) 25 MG tablet TAKE ONE TABLET BY MOUTH EVERY DAY   levothyroxine (SYNTHROID) 112 MCG tablet Take 112 mcg by mouth daily.   lisinopril (ZESTRIL) 40 MG tablet TAKE ONE TABLET BY MOUTH EVERY DAY   metFORMIN (GLUCOPHAGE) 500 MG tablet TAKE 2 TABLETS(1000 MG) BY MOUTH TWICE DAILY WITH A MEAL   metoprolol succinate (TOPROL-XL) 100 MG 24 hr tablet TAKE 1 TABLET BY MOUTH EVERY DAY IMMEDIATELY FOLLOWING A MEAL   rosuvastatin (CRESTOR) 10 MG tablet Take 10 mg by mouth at bedtime.   No facility-administered encounter medications on file as of 12/06/2023.    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   COLONOSCOPY W/ POLYPECTOMY  Feb 2014   5 polyps   TOOTH EXTRACTION  10/2015   x 4 teeth   Family History  Adopted: Yes  Problem Relation Age of Onset   Ataxia Mother 78       SCA3   Ataxia Maternal Grandmother        SCA3   Ataxia Maternal Aunt        SCA3  Social History   Socioeconomic History   Marital status: Married    Spouse name: Rosey Bath   Number of children: 0   Years of education: HS   Highest education level: Some college, no degree  Occupational History   Occupation: service rep   Occupation: retired    Associate Professor: Water quality scientist  Tobacco Use   Smoking status: Every Day    Current packs/day: 0.50    Average packs/day: 0.5 packs/day for 32.0 years (16.0 ttl pk-yrs)    Types: Cigarettes    Passive exposure: Current   Smokeless tobacco: Never   Tobacco comments:    10/26/15  trying to quit, 12/21/21 smoke on weekends only  Vaping Use   Vaping status: Never Used  Substance and Sexual Activity   Alcohol use: No    Alcohol/week: 0.0 standard drinks of alcohol   Drug use: No   Sexual activity: Yes    Comment: 1 partner in last 12 months  Other Topics Concern   Not on file  Social History Narrative   Marital status:  Married x 20 years; happily.      Children: none      Lives: with wife.      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      Alcohol: None      Drugs: none      Exercise: treadmill sporadic.   Social Drivers of Corporate investment banker Strain: Low Risk  (12/06/2023)   Overall Financial Resource Strain (CARDIA)    Difficulty of Paying Living Expenses: Not hard at all  Food Insecurity: No Food Insecurity (12/06/2023)   Hunger Vital Sign    Worried About Running Out of Food in the Last Year: Never true    Ran Out of Food in the Last Year: Never true  Transportation Needs: Unknown (12/06/2023)   PRAPARE - Transportation    Lack of Transportation (Medical): No    Lack of Transportation (Non-Medical): Patient declined  Physical Activity: Insufficiently Active (12/06/2023)   Exercise Vital Sign    Days of Exercise per Week: 2 days    Minutes of Exercise per Session: 30 min  Stress: No Stress Concern Present (12/06/2023)   [employer] of Occupational Health - Occupational Stress Questionnaire    Feeling of Stress : Not at all  Social Connections: Socially Isolated (12/06/2023)   Social Connection and Isolation Panel [NHANES]    Frequency of Communication with Friends and Family: Once a week    Frequency of Social Gatherings with Friends and Family: Once a week    Attends Religious Services: Never    Database administrator or Organizations: No    Attends Engineer, structural: Not on file    Marital Status: Married    Tobacco Counseling Ready to quit: Not  Answered Counseling given: Not Answered Tobacco comments: 10/26/15 trying to quit, 12/21/21 smoke on weekends only    Clinical Intake:  Pre-visit preparation completed: Yes  Pain : No/denies pain     Nutritional Risks: None Diabetes: Yes CBG done?: No Did pt. bring in CBG monitor from home?: No  How often do you need to have someone help you when you read instructions, pamphlets, or other written materials from your doctor or pharmacy?: 1 - Never  Interpreter Needed?: No  Information entered by :: NAllen LPN   Activities of Daily Living     12/06/2023   12:53 PM  In your present state of health, do you have any difficulty performing the following activities:  Hearing? 0  Vision? 0  Difficulty concentrating or making decisions? 0  Walking or climbing stairs? 1  Dressing or bathing? 1  Doing errands, shopping? 1  Preparing Food and eating ? N  Using the Toilet? Y  In the past six months, have you accidently leaked urine? Y  Do you have problems with loss of bowel control? N  Managing your Medications? N  Managing your Finances? N  Housekeeping or managing your Housekeeping? Y    Patient Care Team: [name], NP as PCP - General (Nurse Practitioner) Talmage Coin, MD as Consulting Physician (Endocrinology) Suanne Marker, MD as Consulting Physician (Neurology) Helane Gunther, DPM as Consulting Physician (Podiatry)  Indicate any recent Medical Services you may have received from other than [facility] providers in the past year (date may be approximate).     Assessment:   This is a routine wellness examination for Dakin.  Hearing/Vision screen Hearing Screening - Comments:: Denies hearing issues Vision Screening - Comments:: No regular eye exams,   Goals Addressed             This Visit's Progress    Patient Stated       12/06/2023, wants to gain strength and walk a little bit       Depression Screen     12/06/2023    1:05 PM 11/28/2022     1:29 PM 10/19/2022   11:50 AM 02/14/2022    2:12 PM 09/12/2021   10:45 AM 06/15/2021    4:04 PM 03/16/2021    2:29 PM  PHQ 2/9 Scores  PHQ - 2 Score 0 0 0 0 0 0 2  PHQ- 9 Score 0      4    Fall Risk     12/06/2023   12:53 PM 09/21/2023    2:48 PM 02/26/2023    2:17 PM 11/28/2022    1:29 PM 10/19/2022   11:50 AM  Fall Risk   Falls in the past year? 1 0 0 0 0  Comment usually during transfer or loss of balance      Number falls in past yr: 1 0 0 0 0  Injury with Fall? 1 0 0 0 0  Risk for fall due to : History of fall(s);Impaired balance/gait;Impaired mobility;Medication side effect No Fall Risks No Fall Risks Impaired balance/gait;Medication side effect Impaired balance/gait;Medication side effect  Follow up Falls prevention discussed;Falls evaluation completed   Falls evaluation completed Falls prevention discussed;Falls evaluation completed;Education provided    MEDICARE RISK AT HOME:  Medicare Risk at Home Any stairs in or around the home?: (Patient-Rptd) No If so, are there any without handrails?: (Patient-Rptd) No Home free of loose throw rugs in walkways, pet beds, electrical cords, etc?: (Patient-Rptd) Yes Adequate lighting in your home to reduce risk of falls?: (Patient-Rptd) Yes Life alert?: (Patient-Rptd) No Use of a cane, walker or w/c?: (Patient-Rptd) Yes Grab bars in the bathroom?: (Patient-Rptd) Yes Shower chair or bench in shower?: (Patient-Rptd) Yes Elevated toilet seat or a handicapped toilet?: (Patient-Rptd) Yes  TIMED UP AND GO:  Was the test performed?  No  Cognitive Function: 6CIT completed        12/06/2023    1:06 PM 10/19/2022   11:52 AM  6CIT Screen  What Year? 0 points 0 points  What month? 0 points 0 points  What time? 0 points 0 points  Count back from 20  0 points 0 points  Months in reverse 0 points 0 points  Repeat phrase 0 points 0 points  Total Score 0 points 0 points    Immunizations Immunization History  Administered Date(s) Administered    Influenza Inj Mdck Quad Pf 07/09/2019   Influenza Split 09/23/2013   Influenza,inj,Quad PF,6+ Mos 07/03/2014, 06/10/2015, 06/20/2017, 07/19/2018, 07/09/2019, 11/28/2022   Influenza-Unspecified 09/02/2016, 07/21/2020   PFIZER(Purple Top)SARS-COV-2 Vaccination 01/11/2020, 02/08/2020   Pneumococcal Polysaccharide-23 10/27/2012, 03/19/2018   Tdap 03/06/2014, 09/07/2023    Screening Tests Health Maintenance  Topic Date Due   Zoster Vaccines- Shingrix (1 of 2) Never done   OPHTHALMOLOGY EXAM  05/20/2018   Pneumococcal Vaccine 76-64 Years old (2 of 2 - PCV) 03/20/2019   Diabetic kidney evaluation - Urine ACR  12/23/2019   FOOT EXAM  02/19/2021   HEMOGLOBIN A1C  08/17/2022   Colonoscopy  11/19/2022   INFLUENZA VACCINE  05/10/2023   COVID-19 Vaccine (3 - 2024-25 season) 06/10/2023   Diabetic kidney evaluation - eGFR measurement  09/20/2024   Medicare Annual Wellness (AWV)  12/05/2024   DTaP/Tdap/Td (3 - Td or Tdap) 09/06/2033   Hepatitis C Screening  Completed   HIV Screening  Completed   HPV VACCINES  Aged Out    Health Maintenance  Health Maintenance Due  Topic Date Due   Zoster Vaccines- Shingrix (1 of 2) Never done   OPHTHALMOLOGY EXAM  05/20/2018   Pneumococcal Vaccine 54-13 Years old (2 of 2 - PCV) 03/20/2019   Diabetic kidney evaluation - Urine ACR  12/23/2019   FOOT EXAM  02/19/2021   HEMOGLOBIN A1C  08/17/2022   Colonoscopy  11/19/2022   INFLUENZA VACCINE  05/10/2023   COVID-19 Vaccine (3 - 2024-25 season) 06/10/2023   Health Maintenance Items Addressed: Cologuard Ordered, Due for flu, pneumonia, covid and shingles vaccine , states plans on changing providers. Will handle other open issues with new provider.  Additional Screening:  Vision Screening: Recommended annual ophthalmology exams for early detection of glaucoma and other disorders of the eye.  Dental Screening: Recommended annual dental exams for proper oral hygiene  Community Resource Referral / Chronic  Care Management: CRR required this visit?  No   CCM required this visit?  No     Plan:     I have personally reviewed and noted the following in the patient's chart:   Medical and social history Use of alcohol, tobacco or illicit drugs  Current medications and supplements including opioid prescriptions. Patient is not currently taking opioid prescriptions. Functional ability and status Nutritional status Physical activity Advanced directives List of other physicians Hospitalizations, surgeries, and ER visits in previous 12 months Vitals Screenings to include cognitive, depression, and falls Referrals and appointments  In addition, I have reviewed and discussed with patient certain preventive protocols, quality metrics, and best practice recommendations. A written personalized care plan for preventive services as well as general preventive health recommendations were provided to patient.     Barb Merino, LPN   9/81/1914   After Visit Summary: (MyChart) Due to this being a telephonic visit, the after visit summary with patients personalized plan was offered to patient via MyChart   Notes: Nothing significant to report at this time.

## 2023-12-06 NOTE — Patient Instructions (Signed)
 Mr. Randy Ballard , Thank you for taking time to come for your Medicare Wellness Visit. I appreciate your ongoing commitment to your health goals. Please review the following plan we discussed and let me know if I can assist you in the future.   Referrals/Orders/Follow-Ups/Clinician Recommendations: cologuard ordered  This is a list of the screening recommended for you and due dates:  Health Maintenance  Topic Date Due   Zoster (Shingles) Vaccine (1 of 2) Never done   Eye exam for diabetics  05/20/2018   Pneumococcal Vaccination (2 of 2 - PCV) 03/20/2019   Yearly kidney health urinalysis for diabetes  12/23/2019   Complete foot exam   02/19/2021   Hemoglobin A1C  08/17/2022   Colon Cancer Screening  11/19/2022   Flu Shot  05/10/2023   COVID-19 Vaccine (3 - 2024-25 season) 06/10/2023   Yearly kidney function blood test for diabetes  09/20/2024   Medicare Annual Wellness Visit  12/05/2024   DTaP/Tdap/Td vaccine (3 - Td or Tdap) 09/06/2033   Hepatitis C Screening  Completed   HIV Screening  Completed   HPV Vaccine  Aged Out    Advanced directives: (ACP Link)Information on Advanced Care Planning can be found at [facility] Advance Health Care Directives Advance Health Care Directives ([email or web address]   Next Medicare Annual Wellness Visit scheduled for next year: No, states he plans on changing providers  insert Preventive Care attachment Insert FALL PREVENTION attachment if needed

## 2023-12-19 ENCOUNTER — Ambulatory Visit: Payer: Medicare Other | Admitting: Cardiology

## 2024-01-04 NOTE — Telephone Encounter (Signed)
 Noted thank you

## 2024-01-04 NOTE — Telephone Encounter (Signed)
 Candine from [facility] called wanting to inform provider that the pt cx his PT for today due to a death in his family and will be resuming next week,  [facility] [phone]

## 2024-01-15 ENCOUNTER — Telehealth: Payer: Self-pay | Admitting: Diagnostic Neuroimaging

## 2024-01-15 NOTE — Telephone Encounter (Signed)
 Randy Ballard w/ [facility] has called for verbal orders for a social worker consult, her # with a secure vm is [phone]

## 2024-01-15 NOTE — Telephone Encounter (Signed)
 Radovan from [facility] called wanting VO for the pt's OT for 1w x 8 starting on the 14th  [phone] [facility]

## 2024-01-16 NOTE — Telephone Encounter (Signed)
 Called Radovan back and was able to provide both VO for PT and SW consult. He was appreciative for the call back.

## 2024-02-07 ENCOUNTER — Ambulatory Visit: Admitting: Podiatry

## 2024-02-08 ENCOUNTER — Telehealth: Payer: Self-pay

## 2024-02-08 NOTE — Telephone Encounter (Signed)
 Patient was identified as falling into the [place] Measure - Diabetes.   Patient was: Left voicemail to schedule with primary care provider.

## 2024-02-13 NOTE — Telephone Encounter (Signed)
 Radovan, OT has called to f/u on the fax that was sent to POD 3 on 4-24 and 5-1.  Phone rep spoke with Gabriel John, RN she will look into but has asked that Radavan have the fax sent again to their POD.  Felizardo Hotter has agreed to do so, if need be he can be called at [phone]

## 2024-02-21 ENCOUNTER — Encounter: Payer: Self-pay | Admitting: Podiatry

## 2024-02-21 ENCOUNTER — Ambulatory Visit (INDEPENDENT_AMBULATORY_CARE_PROVIDER_SITE_OTHER): Admitting: Podiatry

## 2024-02-21 DIAGNOSIS — E1149 Type 2 diabetes mellitus with other diabetic neurological complication: Secondary | ICD-10-CM | POA: Diagnosis not present

## 2024-02-21 DIAGNOSIS — L97921 Non-pressure chronic ulcer of unspecified part of left lower leg limited to breakdown of skin: Secondary | ICD-10-CM

## 2024-02-21 DIAGNOSIS — I872 Venous insufficiency (chronic) (peripheral): Secondary | ICD-10-CM

## 2024-02-21 MED ORDER — MUPIROCIN 2 % EX OINT
1.0000 | TOPICAL_OINTMENT | Freq: Two times a day (BID) | CUTANEOUS | 2 refills | Status: AC
Start: 2024-02-21 — End: ?

## 2024-02-21 MED ORDER — DOXYCYCLINE HYCLATE 100 MG PO TABS: 100.0000 mg | ORAL_TABLET | Freq: Two times a day (BID) | ORAL | 0 refills | Status: AC

## 2024-02-21 NOTE — Progress Notes (Signed)
 Chief Complaint  Patient presents with   Diabetic Ulcer    Here today for diabetic ulcer on the right 2nd toe. His skin is really dry and he is very swollen. States he has not been out of his wheel chair in a long time, he is currently sleeping in the wheel chair until hospital bed gets delivered. Last A1c 8.3 in feb. Not currently taking anti coag.     HPI: 63 y.o. male presents today with concern for ulcerations to the second toes, right greater than left.  Patient is diabetic and wheelchair-bound due to ataxia.  Last A1c 8.3.  He reports that he also hit the back of his left calf several weeks ago and he does have a wound in this area.  He does state that he has someone who comes out to assist with trimming his toenails.  Past Medical History:  Diagnosis Date   Diabetes mellitus without complication (HCC)    Diverticulitis    Hyperlipidemia    Hypertension    Hypogonadism male    Neuromuscular disorder (HCC)    SCA-3 (spinocerebellar ataxia type 3) (HCC)    Thyroid  disease 02/06/2009   Graves s/p RAIU; cold nodule s/p negative biopsy    Past Surgical History:  Procedure Laterality Date   BIOPSY THYROID   02/06/2009   cold thyroid  nodule; negative/benign.   COLONOSCOPY W/ POLYPECTOMY  Feb 2014   5 polyps   TOOTH EXTRACTION  10/2015   x 4 teeth    Allergies  Allergen Reactions   Dapagliflozin  Nausea And Vomiting   Dulaglutide  Diarrhea   Sitagliptin  Nausea And Vomiting and Other (See Comments)    pancreatitis    ROS denies any nausea, vomiting, fever, chills, chest pain, shortness of breath   Physical Exam: There were no vitals filed for this visit.  General: The patient is alert and oriented x3 in no acute distress.  Dermatology: Scabs present to bilateral second toes without underlying ulceration.  Skin changes to bilateral lower extremities consistent with venous stasis dermatitis.  There is ulceration about 1.5 to 2 cm in diameter to left posterior calf,  proximal level appears limited to breakdown of skin.  Vascular: DP and PT pulses difficult to palpate due to edema, foot appears warm and well-perfused.  Capillary refill within normal limits.  +2 to +3 pitting edema.  No pain with calf squeeze  Neurological: Light touch sensation diminished to the toes.  Protective sensation absent  Musculoskeletal Exam: Decreased muscle strength bilaterally   Assessment/Plan of Care: 1. Ulcer of left lower extremity, limited to breakdown of skin (HCC)   2. Venous (peripheral) insufficiency   3. Other diabetic neurological complication associated with type 2 diabetes mellitus (HCC)      Meds ordered this encounter  Medications   mupirocin  ointment (BACTROBAN ) 2 %    Sig: Apply 1 Application topically 2 (two) times daily.    Dispense:  30 g    Refill:  2   doxycycline  (VIBRA -TABS) 100 MG tablet    Sig: Take 1 tablet (100 mg total) by mouth 2 (two) times daily for 10 days.    Dispense:  20 tablet    Refill:  0   AMB REFERRAL TO WOUND CARE CENTER  Discussed clinical findings with patient today.  Areas of prior ulceration to the second toes appear well-healed today.  Does have nonhealing ulcer left posterior calf has been present for over 2 weeks complicated by venous insufficiency and uncontrolled edema.  Dressed with mupirocin  and bordered foam dressing today.  Rx for mupirocin  sent to patient's pharmacy for local wound care.  Rx for doxycycline  sent to patient's pharmacy on abundance of caution due to long-term open wound.  Referral sent to wound care center for management of venous ulcer mid to proximal calf left leg with uncontrolled edema.  Will continue to see the patient until he can be established there. Concerned for patient's ability to follow up and care for himself being entirely wheelchair bound due to ataxia, he is at this point awaiting hospital bed to be delivered to his home.  Patient educated on diabetes. Discussed proper diabetic  foot care and discussed risks and complications of disease. Educated patient in depth on reasons to return to the office immediately should he/she discover anything concerning or new on the feet. All questions answered. Discussed proper shoes as well.   Follow-up in 2 weeks   Raeana Blinn L. Lunda Salines, AACFAS [facility]     [address] [place], [place] [address]                Office [phone]  Fax [phone]

## 2024-02-25 NOTE — Telephone Encounter (Signed)
 Pt has called to f/u on the order for a hospital bed, he said Radovan, OT has been trying to get this done for him with no resolution, pt is asking for a call from RN to discuss the requested hospital bed

## 2024-02-25 NOTE — Telephone Encounter (Signed)
 Called the patient back he states that he spoke with someone [employer] to indicates there is still something else that is needed but he does not know what so he provided me the phone number to call them.  I called [employer] and she states that they are needing the order reentered in and office notes submitted.  Advised that we were never informed of office notes being needed and that the order was signed and sent on 5/13 with confirmation that it was received.  Advised that we never received any contact information indicating that they needed anything else.  She provided me with an alternative fax number and I have faxed the order that was signed as well as the office notes to [phone] and received confirmation that it went through.  Called the patient to make him aware as well. He was appreciative.

## 2024-02-25 NOTE — Telephone Encounter (Signed)
 Called the patient back and advised that I had received the orders for the hospital bed. Advised that it was signed and faxed in on 02/19/2024. I received confirmation that it went through for the patient. Pt will check in with them.

## 2024-02-25 NOTE — Telephone Encounter (Signed)
 Pt returned call. Please call back when available.

## 2024-03-07 ENCOUNTER — Ambulatory Visit: Admitting: Podiatry

## 2024-03-07 ENCOUNTER — Telehealth: Payer: Self-pay | Admitting: Family

## 2024-03-07 NOTE — Telephone Encounter (Signed)
 Patient was identified as falling into the [place] Measure - Diabetes.   Patient was: Left voicemail to schedule with primary care provider.

## 2024-03-10 ENCOUNTER — Telehealth: Payer: Self-pay | Admitting: Pulmonary Disease

## 2024-03-20 ENCOUNTER — Encounter: Payer: Self-pay | Admitting: Podiatry

## 2024-03-20 ENCOUNTER — Ambulatory Visit (INDEPENDENT_AMBULATORY_CARE_PROVIDER_SITE_OTHER): Admitting: Podiatry

## 2024-03-20 VITALS — Ht 68.0 in | Wt 204.0 lb

## 2024-03-20 DIAGNOSIS — L853 Xerosis cutis: Secondary | ICD-10-CM

## 2024-03-20 DIAGNOSIS — I890 Lymphedema, not elsewhere classified: Secondary | ICD-10-CM

## 2024-03-20 MED ORDER — AMMONIUM LACTATE 12 % EX CREA: 1.0000 | TOPICAL_CREAM | CUTANEOUS | 0 refills | Status: AC | PRN

## 2024-03-20 NOTE — Progress Notes (Signed)
 Chief Complaint  Patient presents with   Wound Check    Pt is here for wound care due to ulcer on left foot, pt states its healing ok.    HPI: 63 y.o. male presents today to follow-up for left posterior calf ulceration.  He is also requesting that we evaluate the second toes which he previously had ulcerations on.  Patient is diabetic and wheelchair-bound due to ataxia.  He does state that he has someone who comes out to assist with trimming his toenails.  Past Medical History:  Diagnosis Date   Diabetes mellitus without complication (HCC)    Diverticulitis    Hyperlipidemia    Hypertension    Hypogonadism male    Neuromuscular disorder (HCC)    SCA-3 (spinocerebellar ataxia type 3) (HCC)    Thyroid  disease 02/06/2009   Graves s/p RAIU; cold nodule s/p negative biopsy    Past Surgical History:  Procedure Laterality Date   BIOPSY THYROID   02/06/2009   cold thyroid  nodule; negative/benign.   COLONOSCOPY W/ POLYPECTOMY  Feb 2014   5 polyps   TOOTH EXTRACTION  10/2015   x 4 teeth    Allergies  Allergen Reactions   Dapagliflozin  Nausea And Vomiting   Dulaglutide  Diarrhea   Sitagliptin  Nausea And Vomiting and Other (See Comments)    pancreatitis    ROS denies any nausea, vomiting, fever, chills, chest pain, shortness of breath   Physical Exam: There were no vitals filed for this visit.  General: The patient is alert and oriented x3 in no acute distress.  Dermatology: Pedal skin dry and atrophic.  No ulcerations noted to either foot.  Skin changes to bilateral lower extremities consistent with venous stasis dermatitis first lymphedema.  Area of previously noted ulceration left posterior leg appears well-healed.  Significant areas of xerotic flaky dry skin to bilateral lower extremities that appears indurated.  Chronic hemosiderin deposits.  Left hallux toenail has fallen off in the wound bed, no underlying ulceration.  Does appear to have new nail growth  proximally.  Vascular: DP and PT pulses difficult to palpate due to edema, foot appears warm and well-perfused.  Capillary refill within normal limits.  +2 to +3 pitting edema.  No pain with calf squeeze  Neurological: Light touch sensation diminished to the toes.  Protective sensation absent  Musculoskeletal Exam: Decreased muscle strength bilaterally   Assessment/Plan of Care: 1. Xerosis cutis   2. Lymphedema      Meds ordered this encounter  Medications   ammonium lactate  (AMLACTIN) 12 % cream    Sig: Apply 1 Application topically as needed for dry skin.    Dispense:  385 g    Refill:  0   None  Discussed clinical findings with patient today.  Areas of prior noted ulcerations do appear well-healed.  Did previously place referral to wound care center.  Do believe patient would still benefit from this due to lymphedema.  He has minimal mobility and would have great difficulty applying compression stockings or similar devices to lower extremities.  Did send 12% AmLactin to be applied to the dry flaky skin of both legs in the interim.  Recommend use of compression stockings or lymphedema wraps if able to do so.  Patient educated on diabetes. Discussed proper diabetic foot care and discussed risks and complications of disease. Educated patient in depth on reasons to return to the office immediately should he/she discover anything concerning or new on the feet. All questions answered.  Discussed proper shoes as well.   Follow-up as needed   Argie Applegate L. Lunda Salines, AACFAS [facility]     [address] [place], [place] [address]                Office [phone]  Fax [phone]

## 2024-03-23 ENCOUNTER — Encounter: Payer: Self-pay | Admitting: Podiatry

## 2024-04-14 ENCOUNTER — Encounter (HOSPITAL_BASED_OUTPATIENT_CLINIC_OR_DEPARTMENT_OTHER): Attending: General Surgery | Admitting: General Surgery

## 2024-04-14 DIAGNOSIS — G118 Other hereditary ataxias: Secondary | ICD-10-CM | POA: Diagnosis not present

## 2024-04-14 DIAGNOSIS — I890 Lymphedema, not elsewhere classified: Secondary | ICD-10-CM | POA: Insufficient documentation

## 2024-04-14 DIAGNOSIS — Z4802 Encounter for removal of sutures: Secondary | ICD-10-CM | POA: Insufficient documentation

## 2024-06-12 ENCOUNTER — Telehealth: Payer: Self-pay | Admitting: Pulmonary Disease

## 2024-06-12 NOTE — Telephone Encounter (Signed)
 Patient was identified as falling into the [place] Measure - Diabetes.   Patient was: Patient is not currently using our practice. New PCP Dr. Palwai

## 2024-07-02 ENCOUNTER — Telehealth: Payer: Self-pay

## 2024-07-02 NOTE — Telephone Encounter (Signed)
 Patient was identified as falling into the [place] Measure - Diabetes.   Patient was: Left voicemail to schedule with primary care provider.

## 2024-07-14 ENCOUNTER — Telehealth: Payer: Self-pay | Admitting: Pulmonary Disease

## 2024-07-14 NOTE — Telephone Encounter (Signed)
 Patient was identified as falling into the [place] Measure - Diabetes.   Patient was: Patient is not currently using our practice. Patient confirmed by phone he is seeing a PCP outside of [facility] and Amy Lorren is no longer his PCP.

## 2024-07-24 ENCOUNTER — Other Ambulatory Visit: Payer: Self-pay

## 2024-07-24 ENCOUNTER — Emergency Department (HOSPITAL_COMMUNITY)

## 2024-07-24 ENCOUNTER — Emergency Department (HOSPITAL_COMMUNITY)
Admission: EM | Admit: 2024-07-24 | Discharge: 2024-07-25 | Disposition: A | Discharge status: Home / Self Care | Attending: Emergency Medicine | Admitting: Emergency Medicine

## 2024-07-24 DIAGNOSIS — S0083XA Contusion of other part of head, initial encounter: Secondary | ICD-10-CM | POA: Insufficient documentation

## 2024-07-24 DIAGNOSIS — W050XXA Fall from non-moving wheelchair, initial encounter: Secondary | ICD-10-CM | POA: Insufficient documentation

## 2024-07-24 DIAGNOSIS — W19XXXA Unspecified fall, initial encounter: Secondary | ICD-10-CM

## 2024-07-24 MED ORDER — IBUPROFEN 200 MG PO TABS: 600.0000 mg | ORAL_TABLET | Freq: Once | ORAL | Status: DC

## 2024-07-24 NOTE — ED Provider Notes (Signed)
 [place] EMERGENCY DEPARTMENT AT [facility] Provider Note   CSN: [id number] Arrival date & time: 07/24/24  1631     Patient presents with: Fall, Arm Injury, and Head Injury   Randy Ballard is a 63 y.o. male who presents emergency department with a chief complaint of fall.  Patient is wheelchair-bound due to a history of cerebellar ataxia.  Patient reports that he dropped something on the ground earlier today and was reaching for it but was unable to hold himself into his wheelchair and fell face forward onto head he laid on the ground on top of his right arm for about 20 minutes before he was able to be helped off the ground.  He is not on any blood thinners and has a significant large hematoma on his forehead.  He denies any changes in vision, loss of consciousness, confusion, nausea or vomiting.  {Add pertinent medical, surgical, social history, OB history to YEP:67052}  Fall  Arm Injury Head Injury      Prior to Admission medications   Medication Sig Start Date End Date Taking? Authorizing Provider  ammonium lactate  (AMLACTIN) 12 % cream Apply 1 Application topically as needed for dry skin. 03/20/24   Lamount Ethan CROME, DPM  Cholecalciferol (VITAMIN D3) 5000 units CAPS Take 1 capsule (5,000 Units total) by mouth daily. 10/25/17   Loreli Elyn SAILOR, MD  glimepiride  (AMARYL ) 4 MG tablet TAKE 1 TABLET(4 MG) BY MOUTH DAILY 08/17/23   Lorren Greig PARAS, NP  hydrochlorothiazide  (HYDRODIURIL ) 25 MG tablet TAKE ONE TABLET BY MOUTH EVERY DAY 09/21/23   Lorren Greig PARAS, NP  levothyroxine  (SYNTHROID ) 112 MCG tablet Take 112 mcg by mouth daily. 03/09/22   [provider]  lisinopril  (ZESTRIL ) 40 MG tablet TAKE ONE TABLET BY MOUTH EVERY DAY 09/21/23   Lorren Greig PARAS, NP  metFORMIN  (GLUCOPHAGE ) 500 MG tablet TAKE 2 TABLETS(1000 MG) BY MOUTH TWICE DAILY WITH A MEAL 05/21/23   Lorren Greig PARAS, NP  metoprolol  succinate (TOPROL -XL) 100 MG 24 hr tablet TAKE 1 TABLET BY MOUTH EVERY DAY  IMMEDIATELY FOLLOWING A MEAL 09/21/23   Lorren Greig PARAS, NP  mupirocin  ointment (BACTROBAN ) 2 % Apply 1 Application topically 2 (two) times daily. 02/21/24   Lamount Ethan CROME, DPM  rosuvastatin  (CRESTOR ) 10 MG tablet Take 10 mg by mouth at bedtime.    [provider]    Allergies: Dapagliflozin , Dulaglutide , and Sitagliptin     Review of Systems  Updated Vital Signs BP (!) 166/79   Pulse 72   Temp 97.8 F (36.6 C) (Oral)   Resp 16   SpO2 100%   Physical Exam Vitals and nursing note reviewed.  Constitutional:      General: He is not in acute distress.    Appearance: He is well-developed. He is not diaphoretic.  HENT:     Head: Normocephalic.     Comments: Large hematoma to the right forehead with swelling of above the right eye. Eyes:     General: No scleral icterus.    Extraocular Movements: Extraocular movements intact.     Conjunctiva/sclera: Conjunctivae normal.     Pupils: Pupils are equal, round, and reactive to light.  Cardiovascular:     Rate and Rhythm: Normal rate and regular rhythm.     Heart sounds: Normal heart sounds.  Pulmonary:     Effort: Pulmonary effort is normal. No respiratory distress.     Breath sounds: Normal breath sounds.  Abdominal:     Palpations: Abdomen is  soft.     Tenderness: There is no abdominal tenderness.  Musculoskeletal:     Cervical back: Normal range of motion and neck supple.     Comments: Tenderness to the right upper arm no obvious deformity or swelling  Skin:    General: Skin is warm and dry.  Neurological:     Mental Status: He is alert.     Motor: Weakness and atrophy present.     Coordination: Coordination abnormal.  Psychiatric:        Behavior: Behavior normal.     (all labs ordered are listed, but only abnormal results are displayed) Labs Reviewed - No data to display  EKG: None  Radiology: No results found.  {Document cardiac monitor, telemetry assessment procedure when appropriate:32947} Procedures    Medications Ordered in the ED - No data to display    {Click here for ABCD2, HEART and other calculators REFRESH Note before signing:1}                              Medical Decision Making  ***  {Document critical care time when appropriate  Document review of labs and clinical decision tools ie CHADS2VASC2, etc  Document your independent review of radiology images and any outside records  Document your discussion with family members, caretakers and with consultants  Document social determinants of health affecting pt's care  Document your decision making why or why not admission, treatments were needed:32947:::1}   Final diagnoses:  None    ED Discharge Orders     None

## 2024-07-24 NOTE — ED Provider Notes (Incomplete)
 [place] EMERGENCY DEPARTMENT AT [facility] Provider Note   CSN: [id number] Arrival date & time: 07/24/24  1631     Patient presents with: Fall, Arm Injury, and Head Injury   Randy Ballard is a 63 y.o. male who presents emergency department with a chief complaint of fall.  Patient is wheelchair-bound due to a history of cerebellar ataxia.  Patient reports that he dropped something on the ground earlier today and was reaching for it but was unable to hold himself into his wheelchair and fell face forward onto head he laid on the ground on top of his right arm for about 20 minutes before he was able to be helped off the ground.  He is not on any blood thinners and has a significant large hematoma on his forehead.  He denies any changes in vision, loss of consciousness, confusion, nausea or vomiting.  {Add pertinent medical, surgical, social history, OB history to YEP:67052}  Fall  Arm Injury Head Injury      Prior to Admission medications   Medication Sig Start Date End Date Taking? Authorizing Provider  ammonium lactate  (AMLACTIN) 12 % cream Apply 1 Application topically as needed for dry skin. 03/20/24   Lamount Ethan CROME, DPM  Cholecalciferol (VITAMIN D3) 5000 units CAPS Take 1 capsule (5,000 Units total) by mouth daily. 10/25/17   Loreli Elyn SAILOR, MD  glimepiride  (AMARYL ) 4 MG tablet TAKE 1 TABLET(4 MG) BY MOUTH DAILY 08/17/23   Lorren Greig PARAS, NP  hydrochlorothiazide  (HYDRODIURIL ) 25 MG tablet TAKE ONE TABLET BY MOUTH EVERY DAY 09/21/23   Lorren Greig PARAS, NP  levothyroxine  (SYNTHROID ) 112 MCG tablet Take 112 mcg by mouth daily. 03/09/22   [provider]  lisinopril  (ZESTRIL ) 40 MG tablet TAKE ONE TABLET BY MOUTH EVERY DAY 09/21/23   Lorren Greig PARAS, NP  metFORMIN  (GLUCOPHAGE ) 500 MG tablet TAKE 2 TABLETS(1000 MG) BY MOUTH TWICE DAILY WITH A MEAL 05/21/23   Lorren Greig PARAS, NP  metoprolol  succinate (TOPROL -XL) 100 MG 24 hr tablet TAKE 1 TABLET BY MOUTH EVERY DAY  IMMEDIATELY FOLLOWING A MEAL 09/21/23   Lorren Greig PARAS, NP  mupirocin  ointment (BACTROBAN ) 2 % Apply 1 Application topically 2 (two) times daily. 02/21/24   Lamount Ethan CROME, DPM  rosuvastatin  (CRESTOR ) 10 MG tablet Take 10 mg by mouth at bedtime.    [provider]    Allergies: Dapagliflozin , Dulaglutide , and Sitagliptin     Review of Systems  Updated Vital Signs BP (!) 166/79   Pulse 72   Temp 97.8 F (36.6 C) (Oral)   Resp 16   SpO2 100%   Physical Exam Vitals and nursing note reviewed.  Constitutional:      General: He is not in acute distress.    Appearance: He is well-developed. He is not diaphoretic.  HENT:     Head: Normocephalic.     Comments: Large hematoma to the right forehead with swelling of above the right eye. Eyes:     General: No scleral icterus.    Extraocular Movements: Extraocular movements intact.     Conjunctiva/sclera: Conjunctivae normal.     Pupils: Pupils are equal, round, and reactive to light.  Cardiovascular:     Rate and Rhythm: Normal rate and regular rhythm.     Heart sounds: Normal heart sounds.  Pulmonary:     Effort: Pulmonary effort is normal. No respiratory distress.     Breath sounds: Normal breath sounds.  Abdominal:     Palpations: Abdomen is  soft.     Tenderness: There is no abdominal tenderness.  Musculoskeletal:     Cervical back: Normal range of motion and neck supple.     Comments: Tenderness to the right upper arm no obvious deformity or swelling  Skin:    General: Skin is warm and dry.  Neurological:     Mental Status: He is alert.     Motor: Weakness and atrophy present.     Coordination: Coordination abnormal.  Psychiatric:        Behavior: Behavior normal.     (all labs ordered are listed, but only abnormal results are displayed) Labs Reviewed - No data to display  EKG: None  Radiology: No results found.  {Document cardiac monitor, telemetry assessment procedure when appropriate:32947} Procedures    Medications Ordered in the ED - No data to display    {Click here for ABCD2, HEART and other calculators REFRESH Note before signing:1}                              Medical Decision Making  ***  {Document critical care time when appropriate  Document review of labs and clinical decision tools ie CHADS2VASC2, etc  Document your independent review of radiology images and any outside records  Document your discussion with family members, caretakers and with consultants  Document social determinants of health affecting pt's care  Document your decision making why or why not admission, treatments were needed:32947:::1}   Final diagnoses:  None    ED Discharge Orders     None

## 2024-07-24 NOTE — Discharge Instructions (Signed)
###   CT Scan Results Summary     Your recent CT scan was done after your fall from a wheelchair. Here is a summary of what was found:      - **No serious head or brain injury:** The scan did not show any bleeding, swelling, or broken bones in your skull or inside your brain. This means there is no sign of a traumatic brain injury or skull fracture from your fall.[1]      - **Scalp swelling and bruising:** There is a large area of swelling and bruising on the right side of your forehead, which also affects your right upper eyelid. This is called a scalp hematoma, which is a collection of blood under the skin. These usually heal on their own with rest and gentle care, but if you notice increased pain, swelling, or changes in vision, let your healthcare provider know.[2]      - **No broken bones in your face or neck:** The scan did not show any fractures in your facial bones or cervical spine (the bones in your neck). This is reassuring and means your bones were not broken in the fall.[1]      - **Chronic changes in your neck (cervical spine):** The scan showed signs of wear and tear in your neck bones and discs, called cervical spondylosis. This is common as people age. There is some narrowing of the spaces where the nerves and spinal cord pass, and mild to moderate pressure on the spinal cord at several levels. These changes are not new and are not related to your fall, but they can sometimes cause neck pain, stiffness, or nerve symptoms like tingling or weakness. Most people manage these changes with physical therapy, medications, or other non-surgical treatments. Surgery is only considered if symptoms are severe or getting worse.[3]      - **Thyroid  nodule:** The scan found a lump (nodule) in your left thyroid  gland, measuring about 2.7 x 2.4 cm. Thyroid  nodules are common and usually not dangerous. The next step is a thyroid  ultrasound to look at the nodule more closely. Most nodules are harmless, but  sometimes further testing is needed to rule out cancer. Your healthcare provider will guide you on what to do next.[4][5]      If you have any new symptoms, such as severe headache, vision changes, weakness, or trouble speaking, seek medical attention right away. Otherwise, follow up with your healthcare provider for routine care and any recommended tests.      ### References  1. CT of the Neck: Image Analysis and Reporting in the Emergency Setting. Cunqueiro A, [place], Reader, [place], [place]. Radiographics : A Review Publication of the Radiological Society of [employer] 2019;39(6):1760-1781. doi:10.1148/[id number]. 2. Subgaleal Hematoma at the Contralateral Side of Scalp Trauma in an Adult. Laurence CE, Lori ZZ, Jama PORTS, et al. The Journal of Emergency Medicine. 2017;53(5):e85-e88. doi:10.1016/j.jemermed.2017.06.007. 3. Degenerative Cervical Spondylosis. Ricardo BROCKS The [place] Journal of Medicine. 2020;383(2):159-168. doi:10.1056/[id number]. 4. Diagnosis of Thyroid  Nodules. Marsa FRAME, [place] ES. The Lancet. Diabetes & Endocrinology. 2022;10(7):533-539. doi:10.1016/S2213-8587(22)00101-2. 5. Thyroid  Nodules: Diagnostic Evaluation Based on Thyroid  Cancer Risk Assessment. Dennise Earma SAILOR, Iiguez-Ariza NM, Castro MR. BMJ (Clinical Research Ed.). 2020;368:l6670. doi:10.1136/bmj.l6670.

## 2024-07-24 NOTE — ED Triage Notes (Signed)
 EMS from home. PT was leaning forward and fell forward out of his wheelchair. PT injured his RIGHT arm, and has a hematoma to the forehead. No LOC. No thinners.   17GA LEFT AC by EMS.

## 2024-09-06 ENCOUNTER — Other Ambulatory Visit: Payer: Self-pay

## 2024-09-06 ENCOUNTER — Encounter (HOSPITAL_COMMUNITY): Payer: Self-pay

## 2024-09-06 ENCOUNTER — Emergency Department (HOSPITAL_COMMUNITY): Admission: EM | Admit: 2024-09-06 | Discharge: 2024-09-07 | Disposition: A

## 2024-09-06 DIAGNOSIS — W44F3XA Food entering into or through a natural orifice, initial encounter: Secondary | ICD-10-CM | POA: Diagnosis not present

## 2024-09-06 DIAGNOSIS — T18128A Food in esophagus causing other injury, initial encounter: Secondary | ICD-10-CM | POA: Diagnosis present

## 2024-09-06 NOTE — ED Notes (Signed)
 Pt was able to tolerate more sips of coke, provider notified, pt says he feels like he is swallowing like normal

## 2024-09-06 NOTE — ED Provider Notes (Signed)
 [facility] Emergency Department Provider Note MRN:  [id number]  Arrival date & time: 09/06/24     Chief Complaint   Food bolus   History of Present Illness   Randy Ballard is a 63 y.o. year-old male presents to the ED with chief complaint of esophageal food bolus.  He states that he was eating a turkey sandwich and a piece of turkey got stuck in his throat.  He states that he was having significant amount of drooling.  EMS was called, and he was brought to the hospital.  He states that as the ambulance was back into the bay here at the ER, he was able to pass the food bolus.  He states that he feels back to normal now.  He is tolerating p.o. now..  History provided by patient.   Review of Systems  Pertinent positive and negative review of systems noted in HPI.    Physical Exam   Vitals:   09/06/24 2218  BP: (!) 149/97  Pulse: 96  Resp: 18  Temp: 98.5 F (36.9 C)  SpO2: 100%    CONSTITUTIONAL:  non toxic-appearing, NAD NEURO:  Alert and oriented x 3, CN 3-12 grossly intact EYES:  eyes equal and reactive ENT/NECK:  Supple, no stridor  CARDIO:  normal rate, regular rhythm, appears well-perfused  PULM:  No respiratory distress, CTAB GI/GU:  non-distended,  MSK/SPINE:  No gross deformities, no edema, moves all extremities  SKIN:  no rash, atraumatic   *Additional and/or pertinent findings included in MDM below  Diagnostic and Interventional Summary    EKG Interpretation Date/Time:    Ventricular Rate:    PR Interval:    QRS Duration:    QT Interval:    QTC Calculation:   R Axis:      Text Interpretation:         Labs Reviewed - No data to display  No orders to display    Medications - No data to display   Procedures  /  Critical Care Procedures  ED Course and Medical Decision Making  I have reviewed the triage vital signs, the nursing notes, and pertinent available records from the EMR.  Social Determinants Affecting  Complexity of Care: Patient has no clinically significant social determinants affecting this chief complaint..   ED Course:    Medical Decision Making Patient here with suspected esophageal food bolus that has now cleared.  He states that he felt like a piece of turkey sandwich got stuck in his throat and he was having drooling earlier.  He states that  just prior to arriving in the ED on the ambulance, he was able to pass the food bolus.  He is now tolerating oral intake.  He has been drinking Coke here without any trouble.  No further drooling or vomiting.  Patient feels normal.         Consultants: No consultations were needed in caring for this patient.   Treatment and Plan: Emergency department workup does not suggest an emergent condition requiring admission or immediate intervention beyond  what has been performed at this time. The patient is safe for discharge and has  been instructed to return immediately for worsening symptoms, change in  symptoms or any other concerns    Final Clinical Impressions(s) / ED Diagnoses     ICD-10-CM   1. Esophageal obstruction due to food impaction  U81.871J    [id number].F3XA       ED Discharge Orders  None         Discharge Instructions Discussed with and Provided to Patient:   Discharge Instructions   None      Vicky Charleston, PA-C 09/06/24 2305    Simon Lavonia SAILOR, MD 09/06/24 2322

## 2024-09-06 NOTE — ED Triage Notes (Signed)
 Pt is coming from home where his wife is his caregiver, he was eating turkey sandwich and felt stuck had lots of salivating and has been trying to pass it for the last 1.5hrs, Upon arriving to the ED the patient felt as if the food bolus has now dislodged and he can now swallow with no more salivating.   Medic vitals   208/ palp 113hr 100%ra 19rr 216bgl 98.6

## 2024-09-07 NOTE — ED Notes (Signed)
 Wife called and told Pt was being discharged and coming home with PTAR

## 2024-09-09 LAB — COLOGUARD: COLOGUARD: NEGATIVE
